# Patient Record
Sex: Female | Born: 1985 | Race: Black or African American | Hispanic: No | Marital: Married | Smoking: Never smoker
Health system: Southern US, Community
[De-identification: ages and names within clinical notes are randomized; demographics above are authoritative.]

## PROBLEM LIST (undated history)

## (undated) ENCOUNTER — Inpatient Hospital Stay (HOSPITAL_COMMUNITY): Payer: Self-pay

## (undated) ENCOUNTER — Emergency Department (HOSPITAL_COMMUNITY): Admission: EM | Payer: Medicaid Other | Source: Home / Self Care

## (undated) DIAGNOSIS — O139 Gestational [pregnancy-induced] hypertension without significant proteinuria, unspecified trimester: Secondary | ICD-10-CM

## (undated) DIAGNOSIS — D219 Benign neoplasm of connective and other soft tissue, unspecified: Secondary | ICD-10-CM

## (undated) DIAGNOSIS — N83201 Unspecified ovarian cyst, right side: Secondary | ICD-10-CM

## (undated) DIAGNOSIS — Q644 Malformation of urachus: Secondary | ICD-10-CM

## (undated) DIAGNOSIS — I499 Cardiac arrhythmia, unspecified: Secondary | ICD-10-CM

## (undated) DIAGNOSIS — I10 Essential (primary) hypertension: Secondary | ICD-10-CM

## (undated) DIAGNOSIS — Z8759 Personal history of other complications of pregnancy, childbirth and the puerperium: Secondary | ICD-10-CM

## (undated) HISTORY — DX: Essential (primary) hypertension: I10

## (undated) HISTORY — DX: Unspecified ovarian cyst, right side: N83.201

## (undated) HISTORY — DX: Cardiac arrhythmia, unspecified: I49.9

## (undated) HISTORY — DX: Benign neoplasm of connective and other soft tissue, unspecified: D21.9

---
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
## 2013-04-10 ENCOUNTER — Encounter: Payer: Self-pay | Admitting: Obstetrics and Gynecology

## 2013-04-10 ENCOUNTER — Ambulatory Visit (INDEPENDENT_AMBULATORY_CARE_PROVIDER_SITE_OTHER): Payer: Self-pay | Admitting: General Practice

## 2013-04-10 DIAGNOSIS — Z3201 Encounter for pregnancy test, result positive: Secondary | ICD-10-CM

## 2013-04-10 LAB — POCT PREGNANCY, URINE: Preg Test, Ur: POSITIVE — AB

## 2013-04-21 ENCOUNTER — Inpatient Hospital Stay (HOSPITAL_COMMUNITY): Payer: Self-pay

## 2013-04-21 ENCOUNTER — Inpatient Hospital Stay (HOSPITAL_COMMUNITY)
Admission: AD | Admit: 2013-04-21 | Discharge: 2013-04-21 | Disposition: A | Discharge status: Home / Self Care | Payer: Self-pay | Source: Ambulatory Visit | Attending: Obstetrics and Gynecology | Admitting: Obstetrics and Gynecology

## 2013-04-21 ENCOUNTER — Encounter (HOSPITAL_COMMUNITY): Payer: Self-pay

## 2013-04-21 DIAGNOSIS — O210 Mild hyperemesis gravidarum: Secondary | ICD-10-CM | POA: Insufficient documentation

## 2013-04-21 DIAGNOSIS — O219 Vomiting of pregnancy, unspecified: Secondary | ICD-10-CM

## 2013-04-21 DIAGNOSIS — O99891 Other specified diseases and conditions complicating pregnancy: Secondary | ICD-10-CM | POA: Insufficient documentation

## 2013-04-21 DIAGNOSIS — O26899 Other specified pregnancy related conditions, unspecified trimester: Secondary | ICD-10-CM

## 2013-04-21 DIAGNOSIS — R109 Unspecified abdominal pain: Secondary | ICD-10-CM | POA: Insufficient documentation

## 2013-04-21 LAB — COMPREHENSIVE METABOLIC PANEL
ALT: 16 U/L (ref 0–35)
Alkaline Phosphatase: 50 U/L (ref 39–117)
CO2: 24 mEq/L (ref 19–32)
Calcium: 10 mg/dL (ref 8.4–10.5)
GFR calc Af Amer: >90 mL/min (ref >90)
GFR calc non Af Amer: >90 mL/min (ref >90)
Glucose, Bld: 91 mg/dL (ref 70–99)
Sodium: 133 mEq/L — ABNORMAL LOW (ref 135–145)

## 2013-04-21 LAB — URINE MICROSCOPIC-ADD ON

## 2013-04-21 LAB — URINALYSIS, ROUTINE W REFLEX MICROSCOPIC
Hgb urine dipstick: NEGATIVE
Protein, ur: NEGATIVE mg/dL
Urobilinogen, UA: 0.2 mg/dL (ref 0.0–1.0)

## 2013-04-21 MED ORDER — PROMETHAZINE HCL 12.5 MG PO TABS: 12.5000 mg | ORAL_TABLET | Freq: Four times a day (QID) | ORAL | Status: DC | PRN

## 2013-04-21 MED ORDER — PRENATAL PLUS 27-1 MG PO TABS: 1.0000 | ORAL_TABLET | Freq: Every day | ORAL | Status: DC

## 2013-04-21 NOTE — MAU Provider Note (Signed)
CSN: [id number]     Arrival date & time 04/21/13  1015 History       Chief Complaint  Patient presents with  . Abdominal Pain   (Consider location/radiation/quality/duration/timing/severity/associated sxs/prior Treatment) HPI Anne Murillo is a 27 yo G1P0 at [redacted]w[redacted]d gestation with a complaint of abdominal pain x4 weeks.  This pain is diffuse, does not radiate, is constant and best described as sharp pains, is not related to eating or position, and nothing makes it better or worse.  She has not taken anything for the pain.  She has never felt this pain before.  She has also suffered from nausea and vomiting throughout her pregnancy, but denies the pain related to the vomiting.  She reports not drinking or eating frequently.  Denies back pain or pelvic pain.  She also denies dysuria or irritative urinary symptoms.  Denies vaginal discharge, bleeding, or vaginal irritative symptoms.  Denies fever, chills, dizziness, or headaches. Past Medical History  Diagnosis Date  . Medical history non-contributory    Past Surgical History  Procedure Laterality Date  . No past surgeries     History reviewed. No pertinent family history. History  Substance Use Topics  . Smoking status: Never Smoker   . Smokeless tobacco: Not on file  . Alcohol Use: No   OB History   Grav Para Term Preterm Abortions TAB SAB Ect Mult Living   1              Review of Systems Pertinent reviewed in HPI.  Allergies  Review of patient's allergies indicates not on file.  Home Medications  No current outpatient prescriptions on file. BP 117/81  Pulse 67  Temp(Src) 98.1 F (36.7 C) (Oral)  Resp 16  Ht 5\' 2"  (1.575 m)  Wt 77.282 kg (170 lb 6 oz)  BMI 31.15 kg/m2  SpO2 100%  LMP 02/26/2013 Physical Exam General:  Lying comfortably on exam table.  Quiet, pleasant, eyes downcast. Lungs: CTAB CV: S1 and S2 distinct. No murmurs/rubs/gallops. Abdomen:  Mild protuberant contour.  No obvious masses, rashes.  Diffuse  mild abdominal pain TTP, not localized.  No rebound or guarding.  Negative Murphy's sign.   Neuro: AAOx3 ED Course   Procedures (including critical care time) Bedside U/S revealed IUP and fetal heart motion.  Labs Reviewed  URINALYSIS, ROUTINE W REFLEX MICROSCOPIC - Abnormal; Notable for the following:    Leukocytes, UA TRACE (*)    All other components within normal limits  URINE MICROSCOPIC-ADD ON  COMPREHENSIVE METABOLIC PANEL   Results for orders placed during the hospital encounter of 04/21/13 (from the past 24 hour(s))  URINALYSIS, ROUTINE W REFLEX MICROSCOPIC     Status: Abnormal   Collection Time    04/21/13 10:48 AM      Result Value Range   Color, Urine YELLOW  YELLOW   APPearance CLEAR  CLEAR   Specific Gravity, Urine 1.015  1.005 - 1.030   pH 6.0  5.0 - 8.0   Glucose, UA NEGATIVE  NEGATIVE mg/dL   Hgb urine dipstick NEGATIVE  NEGATIVE   Bilirubin Urine NEGATIVE  NEGATIVE   Ketones, ur NEGATIVE  NEGATIVE mg/dL   Protein, ur NEGATIVE  NEGATIVE mg/dL   Urobilinogen, UA 0.2  0.0 - 1.0 mg/dL   Nitrite NEGATIVE  NEGATIVE   Leukocytes, UA TRACE (*) NEGATIVE  URINE MICROSCOPIC-ADD ON     Status: None   Collection Time    04/21/13 10:48 AM      Result Value Range  Squamous Epithelial / LPF RARE  RARE   WBC, UA 0-2  <3 WBC/hpf   RBC / HPF 0-2  <3 RBC/hpf   Bacteria, UA RARE  RARE  COMPREHENSIVE METABOLIC PANEL     Status: Abnormal   Collection Time    04/21/13  2:15 PM      Result Value Range   Sodium 133 (*) 135 - 145 mEq/L   Potassium 3.9  3.5 - 5.1 mEq/L   Chloride 99  96 - 112 mEq/L   CO2 24  19 - 32 mEq/L   Glucose, Bld 91  70 - 99 mg/dL   BUN 6  6 - 23 mg/dL   Creatinine, Ser 7.82  0.50 - 1.10 mg/dL   Calcium 95.6  8.4 - 21.3 mg/dL   Total Protein 7.4  6.0 - 8.3 g/dL   Albumin 4.1  3.5 - 5.2 g/dL   AST 17  0 - 37 U/L   ALT 16  0 - 35 U/L   Alkaline Phosphatase 50  39 - 117 U/L   Total Bilirubin 1.0  0.3 - 1.2 mg/dL   GFR calc non Af Amer >90  >90  mL/min   GFR calc Af Amer >90  >90 mL/min      MDM  Assessment and Plan:   G1P0 [redacted]w[redacted]d, viable IUP  NPC 1. Abdominal pain complicating pregnancy   2. Nausea and vomiting in pregnancy prior to [redacted] weeks gestation   Pain likely d/t strenous vomiting  Saw pt with Garnette Czech PA-S and agree with documentation, evaluation and plan AVS N/V, abd pain  Infor on providers and [facility] eligibility , verification letter given   Medication List         acetaminophen 500 MG tablet  Commonly known as:  TYLENOL  Take 1,000 mg by mouth every 6 (six) hours as needed for pain.     prenatal vitamin w/FE, FA 27-1 MG Tabs tablet  Take 1 tablet by mouth daily.     promethazine 12.5 MG tablet  Commonly known as:  PHENERGAN  Take 1 tablet (12.5 mg total) by mouth every 6 (six) hours as needed for nausea.     promethazine 12.5 MG tablet  Commonly known as:  PHENERGAN  Take 1 tablet (12.5 mg total) by mouth every 6 (six) hours as needed for nausea.       Follow-up Information   Schedule an appointment as soon as possible for a visit with [facility] [place]. (See list of prenatal care providers below)    Contact information:   [address] [place] [address] [phone]      Danae Orleans, CNM 04/21/2013 2:30 PM

## 2013-04-21 NOTE — MAU Note (Signed)
Pt/spouse states here for upper abdominal pain felt since beginning of pregnancy 4 weeks ago, has gotten progressively worse. Pt points to pain across upper abdomen. Denies bleeding, notes white vaginal discharge.

## 2013-04-24 ENCOUNTER — Other Ambulatory Visit: Payer: Self-pay

## 2013-04-24 ENCOUNTER — Encounter: Payer: Self-pay | Admitting: Obstetrics and Gynecology

## 2013-04-24 ENCOUNTER — Ambulatory Visit (INDEPENDENT_AMBULATORY_CARE_PROVIDER_SITE_OTHER): Payer: Self-pay | Admitting: Obstetrics and Gynecology

## 2013-04-24 VITALS — BP 139/76 | Temp 99.1°F | Wt 171.2 lb

## 2013-04-24 DIAGNOSIS — Z3401 Encounter for supervision of normal first pregnancy, first trimester: Secondary | ICD-10-CM | POA: Insufficient documentation

## 2013-04-24 DIAGNOSIS — O26891 Other specified pregnancy related conditions, first trimester: Secondary | ICD-10-CM

## 2013-04-24 DIAGNOSIS — O9989 Other specified diseases and conditions complicating pregnancy, childbirth and the puerperium: Secondary | ICD-10-CM

## 2013-04-24 DIAGNOSIS — I499 Cardiac arrhythmia, unspecified: Secondary | ICD-10-CM | POA: Insufficient documentation

## 2013-04-24 DIAGNOSIS — N898 Other specified noninflammatory disorders of vagina: Secondary | ICD-10-CM

## 2013-04-24 DIAGNOSIS — Z3491 Encounter for supervision of normal pregnancy, unspecified, first trimester: Secondary | ICD-10-CM

## 2013-04-24 MED ORDER — PANTOPRAZOLE SODIUM 40 MG PO TBEC: 40.0000 mg | DELAYED_RELEASE_TABLET | Freq: Every day | ORAL | Status: DC

## 2013-04-24 NOTE — Progress Notes (Signed)
P=91, Here for Initial prenatal visit. Interpreter with patient. C/o abdominal area is painful. States is leaking " water that smells bad and perineal area is itchy. Given new patient information.and discussed bmi/ appropriate weight gain.

## 2013-04-24 NOTE — Progress Notes (Signed)
   Subjective:    Anne Murillo is a G1P0 [redacted]w[redacted]d being seen today for her first obstetrical visit with her husband and an interpreter.  Her obstetrical history is not significant.  She was seen in the MAU on 04/21/13 with c/o abdominal pain with N/V.  IUP and +FHR confirmed with [place].  Patient does intend to breast feed. Pregnancy history fully reviewed.  Patient reports nausea and vomiting improved since taking Tylenol and phenergan.  Abdominal pain has also improved but reports persistent, constant epigastric pain that she cannot describe the quality.  Denies contractions, cramping, LOF, or vaginal bleeding.  Reports a white milky vaginal discharge that itches "a little" x 3 weeks without other irritative symptoms.  No dysuria.  Filed Vitals:   04/24/13 1421  BP: 139/76  Temp: 99.1 F (37.3 C)  Weight: 171 lb 3.2 oz (77.656 kg)    HISTORY: OB History   Grav Para Term Preterm Abortions TAB SAB Ect Mult Living   1              # Outc Date GA Lbr Len/2nd Wgt Sex Del Anes PTL Lv   1 CUR              Past Medical History  Diagnosis Date  . Medical history non-contributory    Past Surgical History  Procedure Laterality Date  . No past surgeries     History reviewed. No pertinent family history.   Exam    Uterus:     Pelvic Exam:    Perineum: No Hemorrhoids   Vulva: normal   Vagina:  wet prep done without spec exam, white milky d/c   pH:    Cervix: not observed   Adnexa: not evaluated   Bony Pelvis: not evaluated  System: Breast:  declined exam   Skin: normal coloration and turgor, no rashes    Neurologic: oriented, normal mood   Extremities: normal strength, tone, and muscle mass, no deformities, no erythema, induration, or nodules   HEENT extra ocular movement intact and sclera clear, anicteric   Mouth/Teeth Not examined   Neck supple and no masses   Cardiovascular: occasional PVCs noted; no rubs/murmurs   Respiratory:  appears well, vitals normal, no respiratory  distress, acyanotic, normal RR, chest clear, no wheezing, crepitations, rhonchi, normal symmetric air entry   Abdomen: soft, nontender, no abnormal masses   Urinary: urethral meatus normal      Assessment:    Pregnancy: G1P0 Patient Active Problem List   Diagnosis Date Noted  . Supervision of normal first pregnancy in first trimester 04/24/2013  . Irregular heart rhythm 04/24/2013        Plan:     Initial labs drawn. EKG for baseline: sinus rhythm with occasional PVCs.  Will repeat if symptomatic. Prenatal vitamins. Rx protonix for pregnancy heartburn. Also advised OTC Tums, whichever patient prefers. Continue phenergan for N/V, and tylenol for abdominal pain. Problem list reviewed and updated. Genetic Screening will be discussed at next visit. Ultrasound performed in MAU on 04/21/13.  Will not need to repeat today.  Follow up in 4 weeks. 30% of 30 min visit spent on counseling and coordination of care.    Garnette Czech 04/24/2013

## 2013-04-24 NOTE — Progress Notes (Signed)
Epigastric pain.Heartburn measures and rx protonix if needed. N/V improving.  Pelvic and Pap next visit.

## 2013-04-24 NOTE — Patient Instructions (Addendum)
Pregnancy - First Trimester During sexual intercourse, millions of sperm go into the vagina. Only 1 sperm will penetrate and fertilize the female egg while it is in the Fallopian tube. One week later, the fertilized egg implants into the wall of the uterus. An embryo begins to develop into a baby. At 6 to 8 weeks, the eyes and face are formed and the heartbeat can be seen on ultrasound. At the end of 12 weeks (first trimester), all the baby's organs are formed. Now that you are pregnant, you will want to do everything you can to have a healthy baby. Two of the most important things are to get good prenatal care and follow your caregiver's instructions. Prenatal care is all the medical care you receive before the baby's birth. It is given to prevent, find, and treat problems during the pregnancy and childbirth. PRENATAL EXAMS  During prenatal visits, your weight, blood pressure, and urine are checked. This is done to make sure you are healthy and progressing normally during the pregnancy.  A pregnant woman should gain 25 to 35 pounds during the pregnancy. However, if you are overweight or underweight, your caregiver will advise you regarding your weight.  Your caregiver will ask and answer questions for you.  Blood work, cervical cultures, other necessary tests, and a Pap test are done during your prenatal exams. These tests are done to check on your health and the probable health of your baby. Tests are strongly recommended and done for HIV with your permission. This is the virus that causes AIDS. These tests are done because medicines can be given to help prevent your baby from being born with this infection should you have been infected without knowing it. Blood work is also used to find out your blood type, previous infections, and follow your blood levels (hemoglobin).  Low hemoglobin (anemia) is common during pregnancy. Iron and vitamins are given to help prevent this. Later in the pregnancy, blood  tests for diabetes will be done along with any other tests if any problems develop.  You may need other tests to make sure you and the baby are doing well. CHANGES DURING THE FIRST TRIMESTER  Your body goes through many changes during pregnancy. They vary from person to person. Talk to your caregiver about changes you notice and are concerned about. Changes can include:  Your menstrual period stops.  The egg and sperm carry the genes that determine what you look like. Genes from you and your partner are forming a baby. The female genes determine whether the baby is a boy or a girl.  Your body increases in girth and you may feel bloated.  Feeling sick to your stomach (nauseous) and throwing up (vomiting). If the vomiting is uncontrollable, call your caregiver.  Your breasts will begin to enlarge and become tender.  Your nipples may stick out more and become darker.  The need to urinate more. Painful urination may mean you have a bladder infection.  Tiring easily.  Loss of appetite.  Cravings for certain kinds of food.  At first, you may gain or lose a couple of pounds.  You may have changes in your emotions from day to day (excited to be pregnant or concerned something may go wrong with the pregnancy and baby).  You may have more vivid and strange dreams. HOME CARE INSTRUCTIONS   It is very important to avoid all smoking, alcohol and non-prescribed drugs during your pregnancy. These affect the formation and growth of the baby.   Avoid chemicals while pregnant to ensure the delivery of a healthy infant.  Start your prenatal visits by the 12th week of pregnancy. They are usually scheduled monthly at first, then more often in the last 2 months before delivery. Keep your caregiver's appointments. Follow your caregiver's instructions regarding medicine use, blood and lab tests, exercise, and diet.  During pregnancy, you are providing food for you and your baby. Eat regular, well-balanced  meals. Choose foods such as meat, fish, milk and other low fat dairy products, vegetables, fruits, and whole-grain breads and cereals. Your caregiver will tell you of the ideal weight gain.  You can help morning sickness by keeping soda crackers at the bedside. Eat a couple before arising in the morning. You may want to use the crackers without salt on them.  Eating 4 to 5 small meals rather than 3 large meals a day also may help the nausea and vomiting.  Drinking liquids between meals instead of during meals also seems to help nausea and vomiting.  A physical sexual relationship may be continued throughout pregnancy if there are no other problems. Problems may be early (premature) leaking of amniotic fluid from the membranes, vaginal bleeding, or belly (abdominal) pain.  Exercise regularly if there are no restrictions. Check with your caregiver or physical therapist if you are unsure of the safety of some of your exercises. Greater weight gain will occur in the last 2 trimesters of pregnancy. Exercising will help:  Control your weight.  Keep you in shape.  Prepare you for labor and delivery.  Help you lose your pregnancy weight after you deliver your baby.  Wear a good support or jogging bra for breast tenderness during pregnancy. This may help if worn during sleep too.  Ask when prenatal classes are available. Begin classes when they are offered.  Do not use hot tubs, steam rooms, or saunas.  Wear your seat belt when driving. This protects you and your baby if you are in an accident.  Avoid raw meat, uncooked cheese, cat litter boxes, and soil used by cats throughout the pregnancy. These carry germs that can cause birth defects in the baby.  The first trimester is a good time to visit your dentist for your dental health. Getting your teeth cleaned is okay. Use a softer toothbrush and brush gently during pregnancy.  Ask for help if you have financial, counseling, or nutritional needs  during pregnancy. Your caregiver will be able to offer counseling for these needs as well as refer you for other special needs.  Do not take any medicines or herbs unless told by your caregiver.  Inform your caregiver if there is any mental or physical domestic violence.  Make a list of emergency phone numbers of family, friends, hospital, and police and fire departments.  Write down your questions. Take them to your prenatal visit.  Do not douche.  Do not cross your legs.  If you have to stand for long periods of time, rotate you feet or take small steps in a circle.  You may have more vaginal secretions that may require a sanitary pad. Do not use tampons or scented sanitary pads. MEDICINES AND DRUG USE IN PREGNANCY  Take prenatal vitamins as directed. The vitamin should contain 1 milligram of folic acid. Keep all vitamins out of reach of children. Only a couple vitamins or tablets containing iron may be fatal to a baby or young child when ingested.  Avoid use of all medicines, including herbs, over-the-counter medicines, not   prescribed or suggested by your caregiver. Only take over-the-counter or prescription medicines for pain, discomfort, or fever as directed by your caregiver. Do not use aspirin, ibuprofen, or naproxen unless directed by your caregiver.  Let your caregiver also know about herbs you may be using.  Alcohol is related to a number of birth defects. This includes fetal alcohol syndrome. All alcohol, in any form, should be avoided completely. Smoking will cause low birth rate and premature babies.  Street or illegal drugs are very harmful to the baby. They are absolutely forbidden. A baby born to an addicted mother will be addicted at birth. The baby will go through the same withdrawal an adult does.  Let your caregiver know about any medicines that you have to take and for what reason you take them. SEEK MEDICAL CARE IF:  You have any concerns or worries during your  pregnancy. It is better to call with your questions if you feel they cannot wait, rather than worry about them. SEEK IMMEDIATE MEDICAL CARE IF:   An unexplained oral temperature above 102 F (38.9 C) develops, or as your caregiver suggests.  You have leaking of fluid from the vagina (birth canal). If leaking membranes are suspected, take your temperature and inform your caregiver of this when you call.  There is vaginal spotting or bleeding. Notify your caregiver of the amount and how many pads are used.  You develop a bad smelling vaginal discharge with a change in the color.  You continue to feel sick to your stomach (nauseated) and have no relief from remedies suggested. You vomit blood or coffee ground-like materials.  You lose more than 2 pounds of weight in 1 week.  You gain more than 2 pounds of weight in 1 week and you notice swelling of your face, hands, feet, or legs.  You gain 5 pounds or more in 1 week (even if you do not have swelling of your hands, face, legs, or feet).  You get exposed to [place] measles and have never had them.  You are exposed to fifth disease or chickenpox.  You develop belly (abdominal) pain. Round ligament discomfort is a common non-cancerous (benign) cause of abdominal pain in pregnancy. Your caregiver still must evaluate this.  You develop headache, fever, diarrhea, pain with urination, or shortness of breath.  You fall or are in a car accident or have any kind of trauma.  There is mental or physical violence in your home. Document Released: 08/25/2001 Document Revised: 05/25/2012 Document Reviewed: 02/26/2009 [facility] Patient Information 2014 [place], [place]. Heartburn Heartburn is a painful, burning sensation in the chest. It may feel worse in certain positions, such as lying down or bending over. It is caused by stomach acid backing up into the tube that carries food from the mouth down to the stomach (lower esophagus).  CAUSES   Large  meals.  Certain foods and drinks.  Exercise.  Increased acid production.  Being overweight or obese.  Certain medicines. SYMPTOMS   Burning pain in the chest or lower throat.  Bitter taste in the mouth.  Coughing. DIAGNOSIS  If the usual treatments for heartburn do not improve your symptoms, then tests may be done to see if there is another condition present. Possible tests may include:  X-rays.  Endoscopy. This is when a tube with a light and a camera on the end is used to examine the esophagus and the stomach.  A test to measure the amount of acid in the esophagus (pH test).  A test to see if the esophagus is working properly (esophageal manometry).  Blood, breath, or stool tests to check for bacteria that cause ulcers. TREATMENT   Your caregiver may tell you to use certain over-the-counter medicines (antacids, acid reducers) for mild heartburn.  Your caregiver may prescribe medicines to decrease the acid in your stomach or protect your stomach lining.  Your caregiver may recommend certain diet changes.  For severe cases, your caregiver may recommend that the head of your bed be elevated on blocks. (Sleeping with more pillows is not an effective treatment as it only changes the position of your head and does not improve the main problem of stomach acid refluxing into the esophagus.) HOME CARE INSTRUCTIONS   Take all medicines as directed by your caregiver.  Raise the head of your bed by putting blocks under the legs if instructed to by your caregiver.  Do not exercise right after eating.  Avoid eating 2 or 3 hours before bed. Do not lie down right after eating.  Eat small meals throughout the day instead of 3 large meals.  Stop smoking if you smoke.  Maintain a healthy weight.  Identify foods and beverages that make your symptoms worse and avoid them. Foods you may want to avoid include:  Peppers.  Chocolate.  High-fat foods, including fried  foods.  Spicy foods.  Garlic and onions.  Citrus fruits, including oranges, grapefruit, lemons, and limes.  Food containing tomatoes or tomato products.  Mint.  Carbonated drinks, caffeinated drinks, and alcohol.  Vinegar. SEEK IMMEDIATE MEDICAL CARE IF:  You have severe chest pain that goes down your arm or into your jaw or neck.  You feel sweaty, dizzy, or lightheaded.  You are short of breath.  You vomit blood.  You have difficulty or pain with swallowing.  You have bloody or black, tarry stools.  You have episodes of heartburn more than 3 times a week for more than 2 weeks. MAKE SURE YOU:  Understand these instructions.  Will watch your condition.  Will get help right away if you are not doing well or get worse. Document Released: 01/17/2009 Document Revised: 11/23/2011 Document Reviewed: 02/15/2011 [facility] Patient Information 2014 [place], [place].

## 2013-04-24 NOTE — MAU Provider Note (Signed)
Attestation of Attending Supervision of Advanced Practitioner (CNM/NP): Evaluation and management procedures were performed by the Advanced Practitioner under my supervision and collaboration.  I have reviewed the Advanced Practitioner's note and chart, and I agree with the management and plan.  Ashvin Adelson 04/24/2013 11:19 AM   

## 2013-04-25 ENCOUNTER — Encounter: Payer: Self-pay | Admitting: *Deleted

## 2013-04-25 LAB — OBSTETRIC PANEL
Basophils Absolute: 0 10*3/uL (ref 0.0–0.1)
Eosinophils Absolute: 0 10*3/uL (ref 0.0–0.7)
Eosinophils Relative: 0 % (ref 0–5)
Lymphs Abs: 1.9 10*3/uL (ref 0.7–4.0)
MCH: 27.5 pg (ref 26.0–34.0)
MCHC: 33.9 g/dL (ref 30.0–36.0)
MCV: 81 fL (ref 78.0–100.0)
Platelets: 244 10*3/uL (ref 150–400)
RDW: 14.5 % (ref 11.5–15.5)

## 2013-04-25 LAB — HIV ANTIBODY (ROUTINE TESTING W REFLEX): HIV: NONREACTIVE

## 2013-04-25 LAB — WET PREP, GENITAL: Clue Cells Wet Prep HPF POC: NONE SEEN

## 2013-04-26 LAB — HEMOGLOBINOPATHY EVALUATION
Hgb A: 97 % (ref 96.8–97.8)
Hgb S Quant: 0 %

## 2013-05-22 ENCOUNTER — Ambulatory Visit (INDEPENDENT_AMBULATORY_CARE_PROVIDER_SITE_OTHER): Payer: Self-pay | Admitting: Obstetrics and Gynecology

## 2013-05-22 VITALS — BP 145/75 | Wt 168.8 lb

## 2013-05-22 DIAGNOSIS — A5901 Trichomonal vulvovaginitis: Secondary | ICD-10-CM

## 2013-05-22 DIAGNOSIS — O2390 Unspecified genitourinary tract infection in pregnancy, unspecified trimester: Secondary | ICD-10-CM

## 2013-05-22 DIAGNOSIS — N72 Inflammatory disease of cervix uteri: Secondary | ICD-10-CM

## 2013-05-22 DIAGNOSIS — Z3401 Encounter for supervision of normal first pregnancy, first trimester: Secondary | ICD-10-CM

## 2013-05-22 DIAGNOSIS — R030 Elevated blood-pressure reading, without diagnosis of hypertension: Secondary | ICD-10-CM

## 2013-05-22 MED ORDER — METRONIDAZOLE 250 MG PO TABS: 500.0000 mg | ORAL_TABLET | Freq: Two times a day (BID) | ORAL | Status: AC

## 2013-05-22 NOTE — Addendum Note (Signed)
Addended by: Faythe Casa on: 05/22/2013 04:03 PM   Modules accepted: Orders

## 2013-05-22 NOTE — Progress Notes (Signed)
Has been here from [place] 4 months. BP recheck: Left w/o BP recheck >Will call to come in 1 wk for BP only.  Heart rate and rhythm normal today.  Fundus1/2 to U. Pap done. Cx friable and WP, CT,GC sent. Denies irritative vaginal discharge now. Had trich 1 mo ago> Rx Flagyl Heartburn is relieved by Protonix.

## 2013-05-22 NOTE — Progress Notes (Signed)
Pulse-  95 

## 2013-05-23 LAB — POCT URINALYSIS DIP (DEVICE)
Bilirubin Urine: NEGATIVE
Nitrite: NEGATIVE
Protein, ur: NEGATIVE mg/dL
Urobilinogen, UA: 0.2 mg/dL (ref 0.0–1.0)
pH: 6.5 (ref 5.0–8.0)

## 2013-05-23 LAB — WET PREP, GENITAL
Clue Cells Wet Prep HPF POC: NONE SEEN
Trich, Wet Prep: NONE SEEN

## 2013-05-31 ENCOUNTER — Telehealth: Payer: Self-pay

## 2013-05-31 MED ORDER — METRONIDAZOLE 500 MG PO TABS: 2000.0000 mg | ORAL_TABLET | Freq: Once | ORAL | Status: DC

## 2013-05-31 NOTE — Telephone Encounter (Signed)
Called pt with [employer] interpreter # [phone] and informed pt that she test + for trich and that she has an antibiotic at her [facility] off [address].  I explained to pt that she tell her partner(s) so that they could treated as well and to refrain from intercourse until they are treated.  I informed pt that she would need to come in for a repeat BP check.  Pt stated that she would be able to come in 06/01/13 @ 0800 for BP check.

## 2013-05-31 NOTE — Telephone Encounter (Signed)
Message copied by Faythe Casa on Wed May 31, 2013  9:04 AM ------      Message from: POE, DEIRDRE C      Created: Mon May 22, 2013  3:38 PM       Please call to tell her to pick up Flagyl Rx for trich. Also she left without BP recheck so come back in a wk for BP recheck.  Speaks [place] ------

## 2013-06-01 ENCOUNTER — Ambulatory Visit (INDEPENDENT_AMBULATORY_CARE_PROVIDER_SITE_OTHER): Payer: Self-pay | Admitting: *Deleted

## 2013-06-01 VITALS — BP 122/84 | HR 80

## 2013-06-01 DIAGNOSIS — R030 Elevated blood-pressure reading, without diagnosis of hypertension: Secondary | ICD-10-CM

## 2013-06-01 NOTE — Patient Instructions (Signed)
Trichomoniasis Trichomoniasis is an infection, caused by the Trichomonas organism, that affects both women and men. In women, the outer female genitalia and the vagina are affected. In men, the penis is mainly affected, but the prostate and other reproductive organs can also be involved. Trichomoniasis is a sexually transmitted disease (STD) and is most often passed to another person through sexual contact. The majority of people who get trichomoniasis do so from a sexual encounter and are also at risk for other STDs. CAUSES   Sexual intercourse with an infected partner.  It can be present in swimming pools or hot tubs. SYMPTOMS   Abnormal gray-green frothy vaginal discharge in women.  Vaginal itching and irritation in women.  Itching and irritation of the area outside the vagina in women.  Penile discharge with or without pain in males.  Inflammation of the urethra (urethritis), causing painful urination.  Bleeding after sexual intercourse. RELATED COMPLICATIONS  Pelvic inflammatory disease.  Infection of the uterus (endometritis).  Infertility.  Tubal (ectopic) pregnancy.  It can be associated with other STDs, including gonorrhea and chlamydia, hepatitis B, and HIV. COMPLICATIONS DURING PREGNANCY  Early (premature) delivery.  Premature rupture of the membranes (PROM).  Low birth weight. DIAGNOSIS   Visualization of Trichomonas under the microscope from the vagina discharge.  Ph of the vagina greater than 4.5, tested with a test tape.  Trich Rapid Test.  Culture of the organism, but this is not usually needed.  It may be found on a Pap test.  Having a "strawberry cervix,"which means the cervix looks very red like a strawberry. TREATMENT   You may be given medication to fight the infection. Inform your caregiver if you could be or are pregnant. Some medications used to treat the infection should not be taken during pregnancy.  Over-the-counter medications or  creams to decrease itching or irritation may be recommended.  Your sexual partner will need to be treated if infected. HOME CARE INSTRUCTIONS   Take all medication prescribed by your caregiver.  Take over-the-counter medication for itching or irritation as directed by your caregiver.  Do not have sexual intercourse while you have the infection.  Do not douche or wear tampons.  Discuss your infection with your partner, as your partner may have acquired the infection from you. Or, your partner may have been the person who transmitted the infection to you.  Have your sex partner examined and treated if necessary.  Practice safe, informed, and protected sex.  See your caregiver for other STD testing. SEEK MEDICAL CARE IF:   You still have symptoms after you finish the medication.  You have an oral temperature above 102 F (38.9 C).  You develop belly (abdominal) pain.  You have pain when you urinate.  You have bleeding after sexual intercourse.  You develop a rash.  The medication makes you sick or makes you throw up (vomit). Document Released: 02/24/2001 Document Revised: 11/23/2011 Document Reviewed: 03/22/2009 [facility] Patient Information 2014 [place], [place].  La trichomonase  La trichomonase est une infection cause par l'organisme Trichomonas, qui affecte les femmes et [employer]. Chez les femmes, les organes gnitaux fminins externe et le vagin sont touchs. Chez les hommes, le pnis est principalement affecte, mais la prostate et d'autres organes de la reproduction peut galement tre impliqu. La trichomonase est [employer] sexuellement transmissible (MST) et est le plus souvent transmis  une autre personne par contact sexuel. La majorit des [employer] reoivent la trichomonase faire de la relation sexuelle et sont galement  risque pour d'autres maladies sexuellement transmissibles.  CAUSES  Les rapports sexuels avec un partenaire infect.  Il peut tre  prsent dans les piscines ou de spas.  Symptmes  Anormale gris-vert des pertes vaginales [employer] femmes mousseuse.  Des dmangeaisons et une irritation vaginale Careers information officer.  Dmangeaisons et des irritations de la zone  l'extrieur du vagin Careers information officer.  Pnis dcharge avec ou sans douleur chez les [place].  Inflammation de l'urtre (urtrite), provoquant une miction douloureuse.  Saignements aprs les rapports sexuels.  COMPLICATIONS LIES  La maladie inflammatoire pelvienne.  L'infection de l'utrus (endomtrite).  L'infertilit.  Tubal (extra-utrine) la Nadara Eaton.  Elle peut tre associe  d'autres maladies sexuellement transmissibles, y compris la gonorrhe et la chlamydia, l'hpatite B et le VIH.  Complications durant la Psychologist, educational (prmatur) de livraison.  La rupture prmature des membranes (RPM).  Faible [employer].  DIAGNOSTIC  Visualisation de Trichomonas sous le microscope de la dcharge du vagin.  PH du vagin suprieur  4,5, teste avec une bande d'essai.  La trichomonase test rapide.  Culture de l'organisme, mais ce n'est gnralement pas ncessaire.  Il se trouve sur un test de Pap.  Avoir un "col fraise, qui signifie col de l'utrus est trs rouge comme une fraise.  TRAITEMENT  Vous pouvez tre donn des mdicaments pour [employer]. Informez votre fournisseur de soins si vous pourriez tre ou tes enceinte. Certains mdicaments utiliss pour traiter l'infection ne devraient pas tre pris pendant la grossesse.  Over-the-mdicaments en vente libre ou des crmes pour [employer] ou irritations peuvent tre recommands.  Votre partenaire sexuel devra tre trait s'il est infect.  INSTRUCTIONS DE SOINS  DOMICILE  Prenez tous les mdicaments prescrits par votre fournisseur de [place].  Prenez-the-counter mdicaments pour les dmangeaisons ou d'irritation comme indiqu par votre fournisseur de [place].  Ne pas avoir  des relations sexuelles alors que vous Biochemist, clinical.  vitez les douches vaginales ou porter des tampons.  Discutez de votre infection  votre partenaire, votre Astronomer de vous. Ou, votre partenaire peut avoir t la personne qui a transmis l'infection  vous.  Demandez  votre partenaire de sexe examins et traits si ncessaire.  Pratiquer sr, inform, et des rapports sexuels protgs.  Consultez votre fournisseur de [employer].  Obtenir des soins si:  Vous Conservator, museum/gallery des symptmes aprs avoir termin Lexicographer.  Vous avez une temprature orale ci-dessus de 102  F (38,9  C).  Vous dveloppez ventre (abdominale).  Vous avez des [employer].  Vous avez des [employer] rapports sexuels.  Vous dveloppez une ruption cutane.  Le mdicament rend malade ou vous fait vomir (vomissements).  Document de Sortie: 13/02/2001 Document de rvision: 07/17/2012 Document de rvision: 03/22/2009  ExitCare information des patients  2014 [place], [place].

## 2013-06-01 NOTE — Progress Notes (Signed)
Blood Pressure at last visit was elevated 145/75. Pt left without having recheck done so Deirdre requested that she come back in this week for BP check only.

## 2013-06-01 NOTE — Progress Notes (Signed)
Patient also had questions about her papsmear. She misunderstood the instructions about trich. I gave her written information as well as explained with her interpreter that she needs to be treated and her partner as well. Pt voiced understanding.

## 2013-06-20 ENCOUNTER — Ambulatory Visit (INDEPENDENT_AMBULATORY_CARE_PROVIDER_SITE_OTHER): Payer: Self-pay | Admitting: Family Medicine

## 2013-06-20 ENCOUNTER — Encounter: Payer: Self-pay | Admitting: Family Medicine

## 2013-06-20 VITALS — BP 123/80 | Temp 98.8°F | Wt 168.5 lb

## 2013-06-20 DIAGNOSIS — Z3482 Encounter for supervision of other normal pregnancy, second trimester: Secondary | ICD-10-CM

## 2013-06-20 DIAGNOSIS — Z3401 Encounter for supervision of normal first pregnancy, first trimester: Secondary | ICD-10-CM

## 2013-06-20 DIAGNOSIS — Z23 Encounter for immunization: Secondary | ICD-10-CM

## 2013-06-20 DIAGNOSIS — Z3400 Encounter for supervision of normal first pregnancy, unspecified trimester: Secondary | ICD-10-CM

## 2013-06-20 LAB — POCT URINALYSIS DIP (DEVICE)
Leukocytes, UA: NEGATIVE
Nitrite: NEGATIVE
Protein, ur: NEGATIVE mg/dL
Urobilinogen, UA: 0.2 mg/dL (ref 0.0–1.0)

## 2013-06-20 MED ORDER — PROMETHAZINE HCL 12.5 MG PO TABS: 12.5000 mg | ORAL_TABLET | Freq: Four times a day (QID) | ORAL | Status: DC | PRN

## 2013-06-20 MED ORDER — PRENATAL PLUS 27-1 MG PO TABS: 1.0000 | ORAL_TABLET | Freq: Every day | ORAL | Status: DC

## 2013-06-20 NOTE — Addendum Note (Signed)
Addended by: Franchot Mimes on: 06/20/2013 03:31 PM   Modules accepted: Orders

## 2013-06-20 NOTE — Addendum Note (Signed)
Addended by: Jill Side on: 06/20/2013 03:22 PM   Modules accepted: Orders

## 2013-06-20 NOTE — Progress Notes (Signed)
   Subjective:    Jalessa Posa is a 27 y.o. female being seen today for her obstetrical visit. She is at [redacted]w[redacted]d gestation. Patient reports no bleeding, no contractions, no cramping and vaginal irritation. Fetal movement: NA.  Menstrual History: OB History   Grav Para Term Preterm Abortions TAB SAB Ect Mult Living   1                Patient's last menstrual period was 02/26/2013.    The following portions of the patient's history were reviewed and updated as appropriate: allergies, current medications, past family history, past medical history, past social history, past surgical history and problem list.  Review of Systems Pertinent items are noted in HPI.   Objective:     BP 123/80  Temp(Src) 98.8 F (37.1 C)  Wt 76.431 kg (168 lb 8 oz)  BMI 30.81 kg/m2  LMP 02/26/2013 Uterine Size: size equals dates  Pelvic Exam:          Perineum: is normal                Vulva: normal              Vagina:  curdlike discharge                     pH:                         FHT 150s Assessment:   Correen Folts is a 27 y.o. G1P0 at [redacted]w[redacted]d by L=7 presents for ROB  Discussed with Patient:  - Ordered genetics screen Visual merchandiser screen and [place]) - Patient plans on breast/ bottle feeding. - Routine precautions discussed (depression, infection s/s).   Patient provided with all pertinent phone numbers for emergencies. - RTC for any VB, regular, painful cramps/ctxs occurring at a rate of >2/10 min, fever (100.5 or higher), n/v/d, any pain that is unresolving or worsening. - RTC in 4 weeks for next appt. - tx for trich but not yeast. Persistent discharge, will repeat wet mount and reeval ensure clearing. Partner not had treatment yet  Problems: Patient Active Problem List   Diagnosis Date Noted  . Cervicitis 05/22/2013  . Elevated blood pressure 05/22/2013  . Supervision of normal first pregnancy in first trimester 04/24/2013  . Irregular heart rhythm 04/24/2013    To Do: 1. 20 week  anatomy scan ordered.  Patient to schedule. 2. Quad screen today 3. Flu today  [ ]  Vaccines: Flu: today  Tdap:  [ ]  BCM:   Edu: [x ] PTL precautions; [ ]  BF class; [ ]  childbirth class; [ ]   BF counseling

## 2013-06-20 NOTE — Progress Notes (Signed)
P= 99 Pt. States she needs refill for prenatal vitamin and phenergan; c/o of nausea. C/o of difficulty sleeping- requests something to help her sleep. C/o of pain in left thigh/hip joint when standing from a sitting position. C/o of intermittent lower abdominal/pelvic pain.  Interpreter present.

## 2013-06-20 NOTE — Addendum Note (Signed)
Addended by: Franchot Mimes on: 06/20/2013 02:19 PM   Modules accepted: Orders

## 2013-06-20 NOTE — Addendum Note (Signed)
Addended by: Jill Side on: 06/20/2013 02:27 PM   Modules accepted: Orders

## 2013-06-20 NOTE — Patient Instructions (Signed)
Pregnancy - Second Trimester The second trimester of pregnancy (3 to 6 months) is a period of rapid growth for you and your baby. At the end of the sixth month, your baby is about 9 inches long and weighs 1 1/2 pounds. You will begin to feel the baby move between 18 and 20 weeks of the pregnancy. This is called quickening. Weight gain is faster. A clear fluid (colostrum) may leak out of your breasts. You may feel small contractions of the womb (uterus). This is known as false labor or Braxton-Hicks contractions. This is like a practice for labor when the baby is ready to be born. Usually, the problems with morning sickness have usually passed by the end of your first trimester. Some women develop small dark blotches (called cholasma, mask of pregnancy) on their face that usually goes away after the baby is born. Exposure to the sun makes the blotches worse. Acne may also develop in some pregnant women and pregnant women who have acne, may find that it goes away. PRENATAL EXAMS  Blood work may continue to be done during prenatal exams. These tests are done to check on your health and the probable health of your baby. Blood work is used to follow your blood levels (hemoglobin). Anemia (low hemoglobin) is common during pregnancy. Iron and vitamins are given to help prevent this. You will also be checked for diabetes between 24 and 28 weeks of the pregnancy. Some of the previous blood tests may be repeated.  The size of the uterus is measured during each visit. This is to make sure that the baby is continuing to grow properly according to the dates of the pregnancy.  Your blood pressure is checked every prenatal visit. This is to make sure you are not getting toxemia.  Your urine is checked to make sure you do not have an infection, diabetes or protein in the urine.  Your weight is checked often to make sure gains are happening at the suggested rate. This is to ensure that both you and your baby are  growing normally.  Sometimes, an ultrasound is performed to confirm the proper growth and development of the baby. This is a test which bounces harmless sound waves off the baby so your caregiver can more accurately determine due dates. Sometimes, a test is done on the amniotic fluid surrounding the baby. This test is called an amniocentesis. The amniotic fluid is obtained by sticking a needle into the belly (abdomen). This is done to check the chromosomes in instances where there is a concern about possible genetic problems with the baby. It is also sometimes done near the end of pregnancy if an early delivery is required. In this case, it is done to help make sure the baby's lungs are mature enough for the baby to live outside of the womb. CHANGES OCCURING IN THE SECOND TRIMESTER OF PREGNANCY Your body goes through many changes during pregnancy. They vary from person to person. Talk to your caregiver about changes you notice that you are concerned about.  During the second trimester, you will likely have an increase in your appetite. It is normal to have cravings for certain foods. This varies from person to person and pregnancy to pregnancy.  Your lower abdomen will begin to bulge.  You may have to urinate more often because the uterus and baby are pressing on your bladder. It is also common to get more bladder infections during pregnancy. You can help this by drinking lots of fluids   and emptying your bladder before and after intercourse.  You may begin to get stretch marks on your hips, abdomen, and breasts. These are normal changes in the body during pregnancy. There are no exercises or medicines to take that prevent this change.  You may begin to develop swollen and bulging veins (varicose veins) in your legs. Wearing support hose, elevating your feet for 15 minutes, 3 to 4 times a day and limiting salt in your diet helps lessen the problem.  Heartburn may develop as the uterus grows and  pushes up against the stomach. Antacids recommended by your caregiver helps with this problem. Also, eating smaller meals 4 to 5 times a day helps.  Constipation can be treated with a stool softener or adding bulk to your diet. Drinking lots of fluids, and eating vegetables, fruits, and whole grains are helpful.  Exercising is also helpful. If you have been very active up until your pregnancy, most of these activities can be continued during your pregnancy. If you have been less active, it is helpful to start an exercise program such as walking.  Hemorrhoids may develop at the end of the second trimester. Warm sitz baths and hemorrhoid cream recommended by your caregiver helps hemorrhoid problems.  Backaches may develop during this time of your pregnancy. Avoid heavy lifting, wear low heal shoes, and practice good posture to help with backache problems.  Some pregnant women develop tingling and numbness of their hand and fingers because of swelling and tightening of ligaments in the wrist (carpel tunnel syndrome). This goes away after the baby is born.  As your breasts enlarge, you may have to get a bigger bra. Get a comfortable, cotton, support bra. Do not get a nursing bra until the last month of the pregnancy if you will be nursing the baby.  You may get a dark line from your belly button to the pubic area called the linea nigra.  You may develop rosy cheeks because of increase blood flow to the face.  You may develop spider looking lines of the face, neck, arms, and chest. These go away after the baby is born. HOME CARE INSTRUCTIONS   It is extremely important to avoid all smoking, herbs, alcohol, and unprescribed drugs during your pregnancy. These chemicals affect the formation and growth of the baby. Avoid these chemicals throughout the pregnancy to ensure the delivery of a healthy infant.  Most of your home care instructions are the same as suggested for the first trimester of your  pregnancy. Keep your caregiver's appointments. Follow your caregiver's instructions regarding medicine use, exercise, and diet.  During pregnancy, you are providing food for you and your baby. Continue to eat regular, well-balanced meals. Choose foods such as meat, fish, milk and other low fat dairy products, vegetables, fruits, and whole-grain breads and cereals. Your caregiver will tell you of the ideal weight gain.  A physical sexual relationship may be continued up until near the end of pregnancy if there are no other problems. Problems could include early (premature) leaking of amniotic fluid from the membranes, vaginal bleeding, abdominal pain, or other medical or pregnancy problems.  Exercise regularly if there are no restrictions. Check with your caregiver if you are unsure of the safety of some of your exercises. The greatest weight gain will occur in the last 2 trimesters of pregnancy. Exercise will help you:  Control your weight.  Get you in shape for labor and delivery.  Lose weight after you have the baby.  Wear   a good support or jogging bra for breast tenderness during pregnancy. This may help if worn during sleep. Pads or tissues may be used in the bra if you are leaking colostrum.  Do not use hot tubs, steam rooms or saunas throughout the pregnancy.  Wear your seat belt at all times when driving. This protects you and your baby if you are in an accident.  Avoid raw meat, uncooked cheese, cat litter boxes, and soil used by cats. These carry germs that can cause birth defects in the baby.  The second trimester is also a good time to visit your dentist for your dental health if this has not been done yet. Getting your teeth cleaned is okay. Use a soft toothbrush. Brush gently during pregnancy.  It is easier to leak urine during pregnancy. Tightening up and strengthening the pelvic muscles will help with this problem. Practice stopping your urination while you are going to the  bathroom. These are the same muscles you need to strengthen. It is also the muscles you would use as if you were trying to stop from passing gas. You can practice tightening these muscles up 10 times a set and repeating this about 3 times per day. Once you know what muscles to tighten up, do not perform these exercises during urination. It is more likely to contribute to an infection by backing up the urine.  Ask for help if you have financial, counseling, or nutritional needs during pregnancy. Your caregiver will be able to offer counseling for these needs as well as refer you for other special needs.  Your skin may become oily. If so, wash your face with mild soap, use non-greasy moisturizer and oil or cream based makeup. MEDICINES AND DRUG USE IN PREGNANCY  Take prenatal vitamins as directed. The vitamin should contain 1 milligram of folic acid. Keep all vitamins out of reach of children. Only a couple vitamins or tablets containing iron may be fatal to a baby or young child when ingested.  Avoid use of all medicines, including herbs, over-the-counter medicines, not prescribed or suggested by your caregiver. Only take over-the-counter or prescription medicines for pain, discomfort, or fever as directed by your caregiver. Do not use aspirin.  Let your caregiver also know about herbs you may be using.  Alcohol is related to a number of birth defects. This includes fetal alcohol syndrome. All alcohol, in any form, should be avoided completely. Smoking will cause low birth rate and premature babies.  Street or illegal drugs are very harmful to the baby. They are absolutely forbidden. A baby born to an addicted mother will be addicted at birth. The baby will go through the same withdrawal an adult does. SEEK MEDICAL CARE IF:  You have any concerns or worries during your pregnancy. It is better to call with your questions if you feel they cannot wait, rather than worry about them. SEEK IMMEDIATE  MEDICAL CARE IF:   An unexplained oral temperature above 102 F (38.9 C) develops, or as your caregiver suggests.  You have leaking of fluid from the vagina (birth canal). If leaking membranes are suspected, take your temperature and tell your caregiver of this when you call.  There is vaginal spotting, bleeding, or passing clots. Tell your caregiver of the amount and how many pads are used. Light spotting in pregnancy is common, especially following intercourse.  You develop a bad smelling vaginal discharge with a change in the color from clear to white.  You continue to feel   sick to your stomach (nauseated) and have no relief from remedies suggested. You vomit blood or coffee ground-like materials.  You lose more than 2 pounds of weight or gain more than 2 pounds of weight over 1 week, or as suggested by your caregiver.  You notice swelling of your face, hands, feet, or legs.  You get exposed to [place] measles and have never had them.  You are exposed to fifth disease or chickenpox.  You develop belly (abdominal) pain. Round ligament discomfort is a common non-cancerous (benign) cause of abdominal pain in pregnancy. Your caregiver still must evaluate you.  You develop a bad headache that does not go away.  You develop fever, diarrhea, pain with urination, or shortness of breath.  You develop visual problems, blurry, or double vision.  You fall or are in a car accident or any kind of trauma.  There is mental or physical violence at home. Document Released: 08/25/2001 Document Revised: 05/25/2012 Document Reviewed: 02/27/2009 [facility] Patient Information 2014 [place], [place]. Place 10-18 weeks prenatal visit patient instructions here.

## 2013-06-21 LAB — AFP, QUAD SCREEN
Curr Gest Age: 16.2 wks.days
Down Syndrome Scr Risk Est: 1:11400 {titer}
INH: 130.7 pg/mL
MoM for INH: 0.74
Osb Risk: 1:24300 {titer}
Trisomy 18 (Edward) Syndrome Interp.: 1:56200 {titer}

## 2013-06-21 LAB — WET PREP, GENITAL

## 2013-06-25 ENCOUNTER — Encounter: Payer: Self-pay | Admitting: Family Medicine

## 2013-07-12 ENCOUNTER — Other Ambulatory Visit: Payer: Self-pay | Admitting: Family Medicine

## 2013-07-12 ENCOUNTER — Ambulatory Visit (HOSPITAL_COMMUNITY)
Admission: RE | Admit: 2013-07-12 | Discharge: 2013-07-12 | Disposition: A | Discharge status: Home / Self Care | Payer: Self-pay | Source: Ambulatory Visit | Attending: Family Medicine | Admitting: Family Medicine

## 2013-07-12 ENCOUNTER — Encounter: Payer: Self-pay | Admitting: Family Medicine

## 2013-07-12 DIAGNOSIS — Z3689 Encounter for other specified antenatal screening: Secondary | ICD-10-CM | POA: Insufficient documentation

## 2013-07-12 DIAGNOSIS — Z3401 Encounter for supervision of normal first pregnancy, first trimester: Secondary | ICD-10-CM

## 2013-07-12 DIAGNOSIS — O3410 Maternal care for benign tumor of corpus uteri, unspecified trimester: Secondary | ICD-10-CM | POA: Insufficient documentation

## 2013-07-18 ENCOUNTER — Ambulatory Visit (INDEPENDENT_AMBULATORY_CARE_PROVIDER_SITE_OTHER): Payer: Self-pay | Admitting: Advanced Practice Midwife

## 2013-07-18 VITALS — BP 128/79 | Wt 171.2 lb

## 2013-07-18 DIAGNOSIS — Z3400 Encounter for supervision of normal first pregnancy, unspecified trimester: Secondary | ICD-10-CM

## 2013-07-18 DIAGNOSIS — Z3401 Encounter for supervision of normal first pregnancy, first trimester: Secondary | ICD-10-CM

## 2013-07-18 DIAGNOSIS — G4700 Insomnia, unspecified: Secondary | ICD-10-CM

## 2013-07-18 DIAGNOSIS — N898 Other specified noninflammatory disorders of vagina: Secondary | ICD-10-CM

## 2013-07-18 LAB — POCT URINALYSIS DIP (DEVICE)
Bilirubin Urine: NEGATIVE
Ketones, ur: NEGATIVE mg/dL
Nitrite: NEGATIVE
Protein, ur: NEGATIVE mg/dL

## 2013-07-18 MED ORDER — DIPHENHYDRAMINE HCL 25 MG PO CAPS: 25.0000 mg | ORAL_CAPSULE | ORAL | Status: DC | PRN

## 2013-07-18 MED ORDER — PROMETHAZINE HCL 12.5 MG PO TABS: 12.5000 mg | ORAL_TABLET | Freq: Four times a day (QID) | ORAL | Status: DC | PRN

## 2013-07-18 NOTE — Progress Notes (Signed)
Having trouble sleeping, since pregnancy started. Has tried nothing yet. Will start with Benadryl. Also c/o vaginal discharge with odor.  Wet prep sent. Will treat pending results.

## 2013-07-18 NOTE — Patient Instructions (Signed)

## 2013-07-18 NOTE — Progress Notes (Signed)
P= 100 Pt. Requests refill of phenergan.  Pt. C/o of lower pelvic pain. States she has some discharge that is some times white and other times yellow, does not burn or itch, but does smell bad.

## 2013-07-19 LAB — WET PREP, GENITAL

## 2013-07-19 NOTE — Addendum Note (Signed)
Addended by: Franchot Mimes on: 07/19/2013 02:23 PM   Modules accepted: Orders

## 2013-08-15 ENCOUNTER — Ambulatory Visit (INDEPENDENT_AMBULATORY_CARE_PROVIDER_SITE_OTHER): Payer: Self-pay | Admitting: Advanced Practice Midwife

## 2013-08-15 ENCOUNTER — Encounter: Payer: Self-pay | Admitting: Advanced Practice Midwife

## 2013-08-15 VITALS — BP 129/82 | Temp 98.6°F | Wt 176.8 lb

## 2013-08-15 DIAGNOSIS — I2510 Atherosclerotic heart disease of native coronary artery without angina pectoris: Secondary | ICD-10-CM

## 2013-08-15 DIAGNOSIS — Z3401 Encounter for supervision of normal first pregnancy, first trimester: Secondary | ICD-10-CM

## 2013-08-15 LAB — POCT URINALYSIS DIP (DEVICE)
Protein, ur: NEGATIVE mg/dL
Specific Gravity, Urine: 1.02 (ref 1.005–1.030)
Urobilinogen, UA: 0.2 mg/dL (ref 0.0–1.0)
pH: 6.5 (ref 5.0–8.0)

## 2013-08-15 NOTE — Progress Notes (Signed)
P - 104 

## 2013-08-15 NOTE — Progress Notes (Signed)
Doing well. No complaints. Interpretor present

## 2013-08-15 NOTE — Patient Instructions (Signed)
Second Trimester of Pregnancy The second trimester is from week 13 through week 28, months 4 through 6. The second trimester is often a time when you feel your best. Your body has also adjusted to being pregnant, and you begin to feel better physically. Usually, morning sickness has lessened or quit completely, you may have more energy, and you may have an increase in appetite. The second trimester is also a time when the fetus is growing rapidly. At the end of the sixth month, the fetus is about 9 inches long and weighs about 1 pounds. You will likely begin to feel the baby move (quickening) between 18 and 20 weeks of the pregnancy. BODY CHANGES Your body goes through many changes during pregnancy. The changes vary from woman to woman.   Your weight will continue to increase. You will notice your lower abdomen bulging out.  You may begin to get stretch marks on your hips, abdomen, and breasts.  You may develop headaches that can be relieved by medicines approved by your caregiver.  You may urinate more often because the fetus is pressing on your bladder.  You may develop or continue to have heartburn as a result of your pregnancy.  You may develop constipation because certain hormones are causing the muscles that push waste through your intestines to slow down.  You may develop hemorrhoids or swollen, bulging veins (varicose veins).  You may have back pain because of the weight gain and pregnancy hormones relaxing your joints between the bones in your pelvis and as a result of a shift in weight and the muscles that support your balance.  Your breasts will continue to grow and be tender.  Your gums may bleed and may be sensitive to brushing and flossing.  Dark spots or blotches (chloasma, mask of pregnancy) may develop on your face. This will likely fade after the baby is born.  A dark line from your belly button to the pubic area (linea nigra) may appear. This will likely fade after the  baby is born. WHAT TO EXPECT AT YOUR PRENATAL VISITS During a routine prenatal visit:  You will be weighed to make sure you and the fetus are growing normally.  Your blood pressure will be taken.  Your abdomen will be measured to track your baby's growth.  The fetal heartbeat will be listened to.  Any test results from the previous visit will be discussed. Your caregiver may ask you:  How you are feeling.  If you are feeling the baby move.  If you have had any abnormal symptoms, such as leaking fluid, bleeding, severe headaches, or abdominal cramping.  If you have any questions. Other tests that may be performed during your second trimester include:  Blood tests that check for:  Low iron levels (anemia).  Gestational diabetes (between 24 and 28 weeks).  Rh antibodies.  Urine tests to check for infections, diabetes, or protein in the urine.  An ultrasound to confirm the proper growth and development of the baby.  An amniocentesis to check for possible genetic problems.  Fetal screens for spina bifida and Down syndrome. HOME CARE INSTRUCTIONS   Avoid all smoking, herbs, alcohol, and unprescribed drugs. These chemicals affect the formation and growth of the baby.  Follow your caregiver's instructions regarding medicine use. There are medicines that are either safe or unsafe to take during pregnancy.  Exercise only as directed by your caregiver. Experiencing uterine cramps is a good sign to stop exercising.  Continue to eat regular,   healthy meals.  Wear a good support bra for breast tenderness.  Do not use hot tubs, steam rooms, or saunas.  Wear your seat belt at all times when driving.  Avoid raw meat, uncooked cheese, cat litter boxes, and soil used by cats. These carry germs that can cause birth defects in the baby.  Take your prenatal vitamins.  Try taking a stool softener (if your caregiver approves) if you develop constipation. Eat more high-fiber foods,  such as fresh vegetables or fruit and whole grains. Drink plenty of fluids to keep your urine clear or pale yellow.  Take warm sitz baths to soothe any pain or discomfort caused by hemorrhoids. Use hemorrhoid cream if your caregiver approves.  If you develop varicose veins, wear support hose. Elevate your feet for 15 minutes, 3 4 times a day. Limit salt in your diet.  Avoid heavy lifting, wear low heel shoes, and practice good posture.  Rest with your legs elevated if you have leg cramps or low back pain.  Visit your dentist if you have not gone yet during your pregnancy. Use a soft toothbrush to brush your teeth and be gentle when you floss.  A sexual relationship may be continued unless your caregiver directs you otherwise.  Continue to go to all your prenatal visits as directed by your caregiver. SEEK MEDICAL CARE IF:   You have dizziness.  You have mild pelvic cramps, pelvic pressure, or nagging pain in the abdominal area.  You have persistent nausea, vomiting, or diarrhea.  You have a bad smelling vaginal discharge.  You have pain with urination. SEEK IMMEDIATE MEDICAL CARE IF:   You have a fever.  You are leaking fluid from your vagina.  You have spotting or bleeding from your vagina.  You have severe abdominal cramping or pain.  You have rapid weight gain or loss.  You have shortness of breath with chest pain.  You notice sudden or extreme swelling of your face, hands, ankles, feet, or legs.  You have not felt your baby move in over an hour.  You have severe headaches that do not go away with medicine.  You have vision changes. Document Released: 08/25/2001 Document Revised: 05/03/2013 Document Reviewed: 11/01/2012 [employer] Patient Information 2014 [employer].  

## 2013-08-28 ENCOUNTER — Encounter: Payer: Self-pay | Admitting: *Deleted

## 2013-08-28 DIAGNOSIS — I2510 Atherosclerotic heart disease of native coronary artery without angina pectoris: Secondary | ICD-10-CM | POA: Insufficient documentation

## 2013-08-28 DIAGNOSIS — O99419 Diseases of the circulatory system complicating pregnancy, unspecified trimester: Secondary | ICD-10-CM

## 2013-09-12 ENCOUNTER — Ambulatory Visit (INDEPENDENT_AMBULATORY_CARE_PROVIDER_SITE_OTHER): Payer: Self-pay | Admitting: Obstetrics and Gynecology

## 2013-09-12 VITALS — BP 127/81 | Temp 98.0°F | Wt 178.5 lb

## 2013-09-12 DIAGNOSIS — Z3401 Encounter for supervision of normal first pregnancy, first trimester: Secondary | ICD-10-CM

## 2013-09-12 DIAGNOSIS — Z3400 Encounter for supervision of normal first pregnancy, unspecified trimester: Secondary | ICD-10-CM

## 2013-09-12 DIAGNOSIS — Z23 Encounter for immunization: Secondary | ICD-10-CM

## 2013-09-12 LAB — POCT URINALYSIS DIP (DEVICE)
Glucose, UA: 100 mg/dL — AB
Nitrite: NEGATIVE
Protein, ur: NEGATIVE mg/dL
Urobilinogen, UA: 0.2 mg/dL (ref 0.0–1.0)
pH: 6 (ref 5.0–8.0)

## 2013-09-12 LAB — CBC
HCT: 36 % (ref 36.0–46.0)
MCH: 28.4 pg (ref 26.0–34.0)
MCHC: 33.3 g/dL (ref 30.0–36.0)
MCV: 85.3 fL (ref 78.0–100.0)
Platelets: 222 10*3/uL (ref 150–400)
RDW: 15.2 % (ref 11.5–15.5)
WBC: 7.8 10*3/uL (ref 4.0–10.5)

## 2013-09-12 MED ORDER — TETANUS-DIPHTH-ACELL PERTUSSIS 5-2.5-18.5 LF-MCG/0.5 IM SUSP: 0.5000 mL | Freq: Once | INTRAMUSCULAR | Status: DC

## 2013-09-12 NOTE — Progress Notes (Signed)
=  88  Pt reports pressure/pain since yesterday lower abd.

## 2013-09-12 NOTE — Addendum Note (Signed)
Addended by: Franchot Mimes on: 09/12/2013 03:36 PM   Modules accepted: Orders

## 2013-09-12 NOTE — Patient Instructions (Signed)
Third Trimester of Pregnancy  The third trimester is from week 29 through week 42, months 7 through 9. The third trimester is a time when the fetus is growing rapidly. At the end of the ninth month, the fetus is about 20 inches in length and weighs 6 10 pounds.   BODY CHANGES  Your body goes through many changes during pregnancy. The changes vary from woman to woman.    Your weight will continue to increase. You can expect to gain 25 35 pounds (11 16 kg) by the end of the pregnancy.   You may begin to get stretch marks on your hips, abdomen, and breasts.   You may urinate more often because the fetus is moving lower into your pelvis and pressing on your bladder.   You may develop or continue to have heartburn as a result of your pregnancy.   You may develop constipation because certain hormones are causing the muscles that push waste through your intestines to slow down.   You may develop hemorrhoids or swollen, bulging veins (varicose veins).   You may have pelvic pain because of the weight gain and pregnancy hormones relaxing your joints between the bones in your pelvis. Back aches may result from over exertion of the muscles supporting your posture.   Your breasts will continue to grow and be tender. A yellow discharge may leak from your breasts called colostrum.   Your belly button may stick out.   You may feel short of breath because of your expanding uterus.   You may notice the fetus "dropping," or moving lower in your abdomen.   You may have a bloody mucus discharge. This usually occurs a few days to a week before labor begins.   Your cervix becomes thin and soft (effaced) near your due date.  WHAT TO EXPECT AT YOUR PRENATAL EXAMS   You will have prenatal exams every 2 weeks until week 36. Then, you will have weekly prenatal exams. During a routine prenatal visit:   You will be weighed to make sure you and the fetus are growing normally.   Your blood pressure is taken.   Your abdomen will be  measured to track your baby's growth.   The fetal heartbeat will be listened to.   Any test results from the previous visit will be discussed.   You may have a cervical check near your due date to see if you have effaced.  At around 36 weeks, your caregiver will check your cervix. At the same time, your caregiver will also perform a test on the secretions of the vaginal tissue. This test is to determine if a type of bacteria, Group B streptococcus, is present. Your caregiver will explain this further.  Your caregiver may ask you:   What your birth plan is.   How you are feeling.   If you are feeling the baby move.   If you have had any abnormal symptoms, such as leaking fluid, bleeding, severe headaches, or abdominal cramping.   If you have any questions.  Other tests or screenings that may be performed during your third trimester include:   Blood tests that check for low iron levels (anemia).   Fetal testing to check the health, activity level, and growth of the fetus. Testing is done if you have certain medical conditions or if there are problems during the pregnancy.  FALSE LABOR  You may feel small, irregular contractions that eventually go away. These are called Braxton Hicks contractions, or   false labor. Contractions may last for hours, days, or even weeks before true labor sets in. If contractions come at regular intervals, intensify, or become painful, it is best to be seen by your caregiver.   SIGNS OF LABOR    Menstrual-like cramps.   Contractions that are 5 minutes apart or less.   Contractions that start on the top of the uterus and spread down to the lower abdomen and back.   A sense of increased pelvic pressure or back pain.   A watery or bloody mucus discharge that comes from the vagina.  If you have any of these signs before the 37th week of pregnancy, call your caregiver right away. You need to go to the hospital to get checked immediately.  HOME CARE INSTRUCTIONS    Avoid all  smoking, herbs, alcohol, and unprescribed drugs. These chemicals affect the formation and growth of the baby.   Follow your caregiver's instructions regarding medicine use. There are medicines that are either safe or unsafe to take during pregnancy.   Exercise only as directed by your caregiver. Experiencing uterine cramps is a good sign to stop exercising.   Continue to eat regular, healthy meals.   Wear a good support bra for breast tenderness.   Do not use hot tubs, steam rooms, or saunas.   Wear your seat belt at all times when driving.   Avoid raw meat, uncooked cheese, cat litter boxes, and soil used by cats. These carry germs that can cause birth defects in the baby.   Take your prenatal vitamins.   Try taking a stool softener (if your caregiver approves) if you develop constipation. Eat more high-fiber foods, such as fresh vegetables or fruit and whole grains. Drink plenty of fluids to keep your urine clear or pale yellow.   Take warm sitz baths to soothe any pain or discomfort caused by hemorrhoids. Use hemorrhoid cream if your caregiver approves.   If you develop varicose veins, wear support hose. Elevate your feet for 15 minutes, 3 4 times a day. Limit salt in your diet.   Avoid heavy lifting, wear low heal shoes, and practice good posture.   Rest a lot with your legs elevated if you have leg cramps or low back pain.   Visit your dentist if you have not gone during your pregnancy. Use a soft toothbrush to brush your teeth and be gentle when you floss.   A sexual relationship may be continued unless your caregiver directs you otherwise.   Do not travel far distances unless it is absolutely necessary and only with the approval of your caregiver.   Take prenatal classes to understand, practice, and ask questions about the labor and delivery.   Make a trial run to the hospital.   Pack your hospital bag.   Prepare the baby's nursery.   Continue to go to all your prenatal visits as directed  by your caregiver.  SEEK MEDICAL CARE IF:   You are unsure if you are in labor or if your water has broken.   You have dizziness.   You have mild pelvic cramps, pelvic pressure, or nagging pain in your abdominal area.   You have persistent nausea, vomiting, or diarrhea.   You have a bad smelling vaginal discharge.   You have pain with urination.  SEEK IMMEDIATE MEDICAL CARE IF:    You have a fever.   You are leaking fluid from your vagina.   You have spotting or bleeding from your vagina.     You have severe abdominal cramping or pain.   You have rapid weight loss or gain.   You have shortness of breath with chest pain.   You notice sudden or extreme swelling of your face, hands, ankles, feet, or legs.   You have not felt your baby move in over an hour.   You have severe headaches that do not go away with medicine.   You have vision changes.  Document Released: 08/25/2001 Document Revised: 05/03/2013 Document Reviewed: 11/01/2012  [employer] Patient Information 2014 [employer].

## 2013-09-12 NOTE — Progress Notes (Signed)
[place] interpreter here. 28 wk labs done. TDap given. Gives 1 day hx intermittent lower abd cramps> cx L/C/H. RLP discussed.

## 2013-09-12 NOTE — Addendum Note (Signed)
Addended by: Louanna Raw on: 09/12/2013 03:17 PM   Modules accepted: Orders

## 2013-09-27 ENCOUNTER — Encounter: Payer: Self-pay | Admitting: Advanced Practice Midwife

## 2013-09-27 ENCOUNTER — Ambulatory Visit (INDEPENDENT_AMBULATORY_CARE_PROVIDER_SITE_OTHER): Payer: Self-pay | Admitting: Advanced Practice Midwife

## 2013-09-27 VITALS — BP 132/82 | Temp 98.5°F | Wt 178.1 lb

## 2013-09-27 DIAGNOSIS — Z3400 Encounter for supervision of normal first pregnancy, unspecified trimester: Secondary | ICD-10-CM

## 2013-09-27 DIAGNOSIS — Z3401 Encounter for supervision of normal first pregnancy, first trimester: Secondary | ICD-10-CM

## 2013-09-27 LAB — POCT URINALYSIS DIP (DEVICE)
Bilirubin Urine: NEGATIVE
GLUCOSE, UA: NEGATIVE mg/dL
HGB URINE DIPSTICK: NEGATIVE
KETONES UR: NEGATIVE mg/dL
Nitrite: NEGATIVE
Protein, ur: NEGATIVE mg/dL
SPECIFIC GRAVITY, URINE: 1.025 (ref 1.005–1.030)
Urobilinogen, UA: 0.2 mg/dL (ref 0.0–1.0)
pH: 5.5 (ref 5.0–8.0)

## 2013-09-27 NOTE — Progress Notes (Signed)
Seen also by me.  Agree with note.

## 2013-09-27 NOTE — Patient Instructions (Signed)
Third Trimester of Pregnancy The third trimester is from week 29 through week 42, months 7 through 9. The third trimester is a time when the fetus is growing rapidly. At the end of the ninth month, the fetus is about 20 inches in length and weighs 6 10 pounds.  BODY CHANGES Your body goes through many changes during pregnancy. The changes vary from woman to woman.   Your weight will continue to increase. You can expect to gain 25 35 pounds (11 16 kg) by the end of the pregnancy.  You may begin to get stretch marks on your hips, abdomen, and breasts.  You may urinate more often because the fetus is moving lower into your pelvis and pressing on your bladder.  You may develop or continue to have heartburn as a result of your pregnancy.  You may develop constipation because certain hormones are causing the muscles that push waste through your intestines to slow down.  You may develop hemorrhoids or swollen, bulging veins (varicose veins).  You may have pelvic pain because of the weight gain and pregnancy hormones relaxing your joints between the bones in your pelvis. Back aches may result from over exertion of the muscles supporting your posture.  Your breasts will continue to grow and be tender. A yellow discharge may leak from your breasts called colostrum.  Your belly button may stick out.  You may feel short of breath because of your expanding uterus.  You may notice the fetus "dropping," or moving lower in your abdomen.  You may have a bloody mucus discharge. This usually occurs a few days to a week before labor begins.  Your cervix becomes thin and soft (effaced) near your due date. WHAT TO EXPECT AT YOUR PRENATAL EXAMS  You will have prenatal exams every 2 weeks until week 36. Then, you will have weekly prenatal exams. During a routine prenatal visit:  You will be weighed to make sure you and the fetus are growing normally.  Your blood pressure is taken.  Your abdomen will be  measured to track your baby's growth.  The fetal heartbeat will be listened to.  Any test results from the previous visit will be discussed.  You may have a cervical check near your due date to see if you have effaced. At around 36 weeks, your caregiver will check your cervix. At the same time, your caregiver will also perform a test on the secretions of the vaginal tissue. This test is to determine if a type of bacteria, Group B streptococcus, is present. Your caregiver will explain this further. Your caregiver may ask you:  What your birth plan is.  How you are feeling.  If you are feeling the baby move.  If you have had any abnormal symptoms, such as leaking fluid, bleeding, severe headaches, or abdominal cramping.  If you have any questions. Other tests or screenings that may be performed during your third trimester include:  Blood tests that check for low iron levels (anemia).  Fetal testing to check the health, activity level, and growth of the fetus. Testing is done if you have certain medical conditions or if there are problems during the pregnancy. FALSE LABOR You may feel small, irregular contractions that eventually go away. These are called Braxton Hicks contractions, or false labor. Contractions may last for hours, days, or even weeks before true labor sets in. If contractions come at regular intervals, intensify, or become painful, it is best to be seen by your caregiver.  SIGNS OF LABOR   Menstrual-like cramps.  Contractions that are 5 minutes apart or less.  Contractions that start on the top of the uterus and spread down to the lower abdomen and back.  A sense of increased pelvic pressure or back pain.  A watery or bloody mucus discharge that comes from the vagina. If you have any of these signs before the 37th week of pregnancy, call your caregiver right away. You need to go to the hospital to get checked immediately. HOME CARE INSTRUCTIONS   Avoid all  smoking, herbs, alcohol, and unprescribed drugs. These chemicals affect the formation and growth of the baby.  Follow your caregiver's instructions regarding medicine use. There are medicines that are either safe or unsafe to take during pregnancy.  Exercise only as directed by your caregiver. Experiencing uterine cramps is a good sign to stop exercising.  Continue to eat regular, healthy meals.  Wear a good support bra for breast tenderness.  Do not use hot tubs, steam rooms, or saunas.  Wear your seat belt at all times when driving.  Avoid raw meat, uncooked cheese, cat litter boxes, and soil used by cats. These carry germs that can cause birth defects in the baby.  Take your prenatal vitamins.  Try taking a stool softener (if your caregiver approves) if you develop constipation. Eat more high-fiber foods, such as fresh vegetables or fruit and whole grains. Drink plenty of fluids to keep your urine clear or pale yellow.  Take warm sitz baths to soothe any pain or discomfort caused by hemorrhoids. Use hemorrhoid cream if your caregiver approves.  If you develop varicose veins, wear support hose. Elevate your feet for 15 minutes, 3 4 times a day. Limit salt in your diet.  Avoid heavy lifting, wear low heal shoes, and practice good posture.  Rest a lot with your legs elevated if you have leg cramps or low back pain.  Visit your dentist if you have not gone during your pregnancy. Use a soft toothbrush to brush your teeth and be gentle when you floss.  A sexual relationship may be continued unless your caregiver directs you otherwise.  Do not travel far distances unless it is absolutely necessary and only with the approval of your caregiver.  Take prenatal classes to understand, practice, and ask questions about the labor and delivery.  Make a trial run to the hospital.  Pack your hospital bag.  Prepare the baby's nursery.  Continue to go to all your prenatal visits as directed  by your caregiver. SEEK MEDICAL CARE IF:  You are unsure if you are in labor or if your water has broken.  You have dizziness.  You have mild pelvic cramps, pelvic pressure, or nagging pain in your abdominal area.  You have persistent nausea, vomiting, or diarrhea.  You have a bad smelling vaginal discharge.  You have pain with urination. SEEK IMMEDIATE MEDICAL CARE IF:   You have a fever.  You are leaking fluid from your vagina.  You have spotting or bleeding from your vagina.  You have severe abdominal cramping or pain.  You have rapid weight loss or gain.  You have shortness of breath with chest pain.  You notice sudden or extreme swelling of your face, hands, ankles, feet, or legs.  You have not felt your baby move in over an hour.  You have severe headaches that do not go away with medicine.  You have vision changes. Document Released: 08/25/2001 Document Revised: 05/03/2013 Document Reviewed:   You have severe abdominal cramping or pain.   You have rapid weight loss or gain.   You have shortness of breath with chest pain.   You notice sudden or extreme swelling of your face, hands, ankles, feet, or legs.   You have not felt your baby move in over an hour.   You have severe headaches that do not go away with medicine.   You have vision changes.  Document Released: 08/25/2001 Document Revised: 05/03/2013 Document Reviewed: 11/01/2012  [employer] Patient Information 2014 [employer].

## 2013-09-27 NOTE — Progress Notes (Signed)
[place] interpreter required.  Patient is doing well.  No complaints.  Denies vaginal bleeding, loss of fluids, or contractions.  Good fetal movement.  Plans to breastfeed.  Discussed birth control options - would like to think about it.

## 2013-09-27 NOTE — Progress Notes (Signed)
P = 102   [place] interpreter here

## 2013-10-11 ENCOUNTER — Encounter: Payer: Self-pay | Admitting: Advanced Practice Midwife

## 2013-10-11 ENCOUNTER — Ambulatory Visit (INDEPENDENT_AMBULATORY_CARE_PROVIDER_SITE_OTHER): Payer: Self-pay | Admitting: Advanced Practice Midwife

## 2013-10-11 VITALS — BP 130/86 | Wt 178.3 lb

## 2013-10-11 DIAGNOSIS — Z609 Problem related to social environment, unspecified: Secondary | ICD-10-CM

## 2013-10-11 DIAGNOSIS — I2510 Atherosclerotic heart disease of native coronary artery without angina pectoris: Secondary | ICD-10-CM

## 2013-10-11 DIAGNOSIS — Z603 Acculturation difficulty: Secondary | ICD-10-CM | POA: Insufficient documentation

## 2013-10-11 DIAGNOSIS — O99419 Diseases of the circulatory system complicating pregnancy, unspecified trimester: Secondary | ICD-10-CM

## 2013-10-11 LAB — POCT URINALYSIS DIP (DEVICE)
BILIRUBIN URINE: NEGATIVE
Glucose, UA: 250 mg/dL — AB
HGB URINE DIPSTICK: NEGATIVE
KETONES UR: NEGATIVE mg/dL
Nitrite: NEGATIVE
PH: 6 (ref 5.0–8.0)
Protein, ur: NEGATIVE mg/dL
SPECIFIC GRAVITY, URINE: 1.015 (ref 1.005–1.030)
Urobilinogen, UA: 0.2 mg/dL (ref 0.0–1.0)

## 2013-10-11 NOTE — Progress Notes (Signed)
Doing well. No leaking, bleeding or pain. Good FM

## 2013-10-11 NOTE — Patient Instructions (Signed)
Third Trimester of Pregnancy The third trimester is from week 29 through week 42, months 7 through 9. The third trimester is a time when the fetus is growing rapidly. At the end of the ninth month, the fetus is about 20 inches in length and weighs 6 10 pounds.  BODY CHANGES Your body goes through many changes during pregnancy. The changes vary from woman to woman.   Your weight will continue to increase. You can expect to gain 25 35 pounds (11 16 kg) by the end of the pregnancy.  You may begin to get stretch marks on your hips, abdomen, and breasts.  You may urinate more often because the fetus is moving lower into your pelvis and pressing on your bladder.  You may develop or continue to have heartburn as a result of your pregnancy.  You may develop constipation because certain hormones are causing the muscles that push waste through your intestines to slow down.  You may develop hemorrhoids or swollen, bulging veins (varicose veins).  You may have pelvic pain because of the weight gain and pregnancy hormones relaxing your joints between the bones in your pelvis. Back aches may result from over exertion of the muscles supporting your posture.  Your breasts will continue to grow and be tender. A yellow discharge may leak from your breasts called colostrum.  Your belly button may stick out.  You may feel short of breath because of your expanding uterus.  You may notice the fetus "dropping," or moving lower in your abdomen.  You may have a bloody mucus discharge. This usually occurs a few days to a week before labor begins.  Your cervix becomes thin and soft (effaced) near your due date. WHAT TO EXPECT AT YOUR PRENATAL EXAMS  You will have prenatal exams every 2 weeks until week 36. Then, you will have weekly prenatal exams. During a routine prenatal visit:  You will be weighed to make sure you and the fetus are growing normally.  Your blood pressure is taken.  Your abdomen will be  measured to track your baby's growth.  The fetal heartbeat will be listened to.  Any test results from the previous visit will be discussed.  You may have a cervical check near your due date to see if you have effaced. At around 36 weeks, your caregiver will check your cervix. At the same time, your caregiver will also perform a test on the secretions of the vaginal tissue. This test is to determine if a type of bacteria, Group B streptococcus, is present. Your caregiver will explain this further. Your caregiver may ask you:  What your birth plan is.  How you are feeling.  If you are feeling the baby move.  If you have had any abnormal symptoms, such as leaking fluid, bleeding, severe headaches, or abdominal cramping.  If you have any questions. Other tests or screenings that may be performed during your third trimester include:  Blood tests that check for low iron levels (anemia).  Fetal testing to check the health, activity level, and growth of the fetus. Testing is done if you have certain medical conditions or if there are problems during the pregnancy. FALSE LABOR You may feel small, irregular contractions that eventually go away. These are called Braxton Hicks contractions, or false labor. Contractions may last for hours, days, or even weeks before true labor sets in. If contractions come at regular intervals, intensify, or become painful, it is best to be seen by your caregiver.  SIGNS OF LABOR   Menstrual-like cramps.  Contractions that are 5 minutes apart or less.  Contractions that start on the top of the uterus and spread down to the lower abdomen and back.  A sense of increased pelvic pressure or back pain.  A watery or bloody mucus discharge that comes from the vagina. If you have any of these signs before the 37th week of pregnancy, call your caregiver right away. You need to go to the hospital to get checked immediately. HOME CARE INSTRUCTIONS   Avoid all  smoking, herbs, alcohol, and unprescribed drugs. These chemicals affect the formation and growth of the baby.  Follow your caregiver's instructions regarding medicine use. There are medicines that are either safe or unsafe to take during pregnancy.  Exercise only as directed by your caregiver. Experiencing uterine cramps is a good sign to stop exercising.  Continue to eat regular, healthy meals.  Wear a good support bra for breast tenderness.  Do not use hot tubs, steam rooms, or saunas.  Wear your seat belt at all times when driving.  Avoid raw meat, uncooked cheese, cat litter boxes, and soil used by cats. These carry germs that can cause birth defects in the baby.  Take your prenatal vitamins.  Try taking a stool softener (if your caregiver approves) if you develop constipation. Eat more high-fiber foods, such as fresh vegetables or fruit and whole grains. Drink plenty of fluids to keep your urine clear or pale yellow.  Take warm sitz baths to soothe any pain or discomfort caused by hemorrhoids. Use hemorrhoid cream if your caregiver approves.  If you develop varicose veins, wear support hose. Elevate your feet for 15 minutes, 3 4 times a day. Limit salt in your diet.  Avoid heavy lifting, wear low heal shoes, and practice good posture.  Rest a lot with your legs elevated if you have leg cramps or low back pain.  Visit your dentist if you have not gone during your pregnancy. Use a soft toothbrush to brush your teeth and be gentle when you floss.  A sexual relationship may be continued unless your caregiver directs you otherwise.  Do not travel far distances unless it is absolutely necessary and only with the approval of your caregiver.  Take prenatal classes to understand, practice, and ask questions about the labor and delivery.  Make a trial run to the hospital.  Pack your hospital bag.  Prepare the baby's nursery.  Continue to go to all your prenatal visits as directed  by your caregiver. SEEK MEDICAL CARE IF:  You are unsure if you are in labor or if your water has broken.  You have dizziness.  You have mild pelvic cramps, pelvic pressure, or nagging pain in your abdominal area.  You have persistent nausea, vomiting, or diarrhea.  You have a bad smelling vaginal discharge.  You have pain with urination. SEEK IMMEDIATE MEDICAL CARE IF:   You have a fever.  You are leaking fluid from your vagina.  You have spotting or bleeding from your vagina.  You have severe abdominal cramping or pain.  You have rapid weight loss or gain.  You have shortness of breath with chest pain.  You notice sudden or extreme swelling of your face, hands, ankles, feet, or legs.  You have not felt your baby move in over an hour.  You have severe headaches that do not go away with medicine.  You have vision changes. Document Released: 08/25/2001 Document Revised: 05/03/2013 Document Reviewed:   You have severe abdominal cramping or pain.   You have rapid weight loss or gain.   You have shortness of breath with chest pain.   You notice sudden or extreme swelling of your face, hands, ankles, feet, or legs.   You have not felt your baby move in over an hour.   You have severe headaches that do not go away with medicine.   You have vision changes.  Document Released: 08/25/2001 Document Revised: 05/03/2013 Document Reviewed: 11/01/2012  [employer] Patient Information 2014 [employer].

## 2013-10-11 NOTE — Progress Notes (Signed)
Pulse: 106

## 2013-10-25 ENCOUNTER — Encounter: Payer: Self-pay | Admitting: Family Medicine

## 2013-10-25 ENCOUNTER — Ambulatory Visit (INDEPENDENT_AMBULATORY_CARE_PROVIDER_SITE_OTHER): Payer: Medicaid Other | Admitting: Family Medicine

## 2013-10-25 VITALS — BP 120/76 | Temp 97.5°F | Wt 180.1 lb

## 2013-10-25 DIAGNOSIS — O10019 Pre-existing essential hypertension complicating pregnancy, unspecified trimester: Secondary | ICD-10-CM

## 2013-10-25 DIAGNOSIS — O10919 Unspecified pre-existing hypertension complicating pregnancy, unspecified trimester: Secondary | ICD-10-CM

## 2013-10-25 DIAGNOSIS — O139 Gestational [pregnancy-induced] hypertension without significant proteinuria, unspecified trimester: Secondary | ICD-10-CM

## 2013-10-25 DIAGNOSIS — Z3401 Encounter for supervision of normal first pregnancy, first trimester: Secondary | ICD-10-CM

## 2013-10-25 LAB — CBC
HEMATOCRIT: 37 % (ref 36.0–46.0)
Hemoglobin: 12.4 g/dL (ref 12.0–15.0)
MCH: 28.1 pg (ref 26.0–34.0)
MCHC: 33.5 g/dL (ref 30.0–36.0)
MCV: 83.7 fL (ref 78.0–100.0)
PLATELETS: 244 10*3/uL (ref 150–400)
RBC: 4.42 MIL/uL (ref 3.87–5.11)
RDW: 14.5 % (ref 11.5–15.5)
WBC: 6.8 10*3/uL (ref 4.0–10.5)

## 2013-10-25 LAB — POCT URINALYSIS DIP (DEVICE)
Bilirubin Urine: NEGATIVE
Glucose, UA: 250 mg/dL — AB
KETONES UR: NEGATIVE mg/dL
Nitrite: NEGATIVE
PH: 5.5 (ref 5.0–8.0)
PROTEIN: NEGATIVE mg/dL
SPECIFIC GRAVITY, URINE: 1.02 (ref 1.005–1.030)
Urobilinogen, UA: 1 mg/dL (ref 0.0–1.0)

## 2013-10-25 NOTE — Addendum Note (Signed)
Addended by: Ernie Avena on: 10/25/2013 12:06 PM   Modules accepted: Orders

## 2013-10-25 NOTE — Progress Notes (Signed)
Pulse- 125

## 2013-10-25 NOTE — Progress Notes (Signed)
Pt reports headaches resolves with tylenol without vision changes. No abdominal pain no RUQ pain.  +FM, no lof, no vb, no ctx  Anne Murillo is a 28 y.o. G1P0 at [redacted]w[redacted]d  here for ROB visit.  Discussed with Patient:  -Plans to breast feed.  All questions answered. -Continue prenatal vitamins. -Reviewed fetal kick counts Pt to perform daily at a time when the baby is active, lie laterally with both hands on belly in quiet room and count all movements (hiccups, shoulder rolls, obvious kicks, etc); pt is to report to clinic L&D for less than 10 movements felt in a one hour time period-pt told as soon as she counts 10 movements the count is complete.  - Routine precautions discussed (depression, infection s/s).   Patient provided with all pertinent phone numbers for emergencies. - RTC for any VB, regular, painful cramps/ctxs occurring at a rate of >2/10 min, fever (100.5 or higher), n/v/d, any pain that is unresolving or worsening, LOF, decreased fetal movement, CP, SOB, edema - reviewed preeclampsia precautions.  - RT HRCin 1 weeks for next appt.  Problems: Patient Active Problem List   Diagnosis Date Noted  . Language barrier, cultural differences 10/11/2013  . Other cardiovascular diseases of mother, antepartum 08/28/2013  . Insomnia 07/18/2013  . Cervicitis 05/22/2013  . Elevated blood pressure 05/22/2013  . Supervision of normal first pregnancy in first trimester 04/24/2013  . Irregular heart rhythm 04/24/2013    To Do: 1. CBC. CMP, PR: Cr and 24hr urine, Growth [place] (pt is self pay, will discuss with husband if able to pay. Will schedule and if unable to afford will cancel)  [ ]  BCM: undecided [ ]  Readiness:   Edu: [x]  PTL precautions; [ ]  BF class; [ ]  childbirth class; [ ]   BF counseling;

## 2013-10-25 NOTE — Patient Instructions (Signed)
Hypertension During Pregnancy Hypertension is also called high blood pressure. It can occur at any time in life and during pregnancy. When you have hypertension, there is extra pressure inside your blood vessels that carry blood from the heart to the rest of your body (arteries). Hypertension during pregnancy can cause problems for you and your baby. Your baby might not weigh as much as it should at birth or might be born early (premature). Very bad cases of hypertension during pregnancy can be life threatening.  Different types of hypertension can occur during pregnancy.   Chronic hypertension. This happens when a woman has hypertension before pregnancy and it continues during pregnancy.  Gestational hypertension. This is when hypertension develops during pregnancy.  Preeclampsia or toxemia of pregnancy. This is a very serious type of hypertension that develops only during pregnancy. It is a disease that affects the whole body (systemic) and can be very dangerous for both mother and baby.  Gestational hypertension and preeclampsia usually go away after your baby is born. Blood pressure generally stabilizes within 6 weeks. Women who have hypertension during pregnancy have a greater chance of developing hypertension later in life or with future pregnancies. RISK FACTORS Some factors make you more likely to develop hypertension during pregnancy. Risk factors include:  Having hypertension before pregnancy.  Having hypertension during a previous pregnancy.  Being overweight.  Being older than 40.  Being pregnant with more than one baby (multiples).  Having diabetes or kidney problems. SIGNS AND SYMPTOMS Chronic and gestational hypertension rarely cause symptoms. Preeclampsia has symptoms, which may include:  Increased protein in your urine. Your health care provider will check for this at every prenatal visit.  Swelling of your hands and face.  Rapid weight gain.  Headaches.  Visual  changes.  Being bothered by light.  Abdominal pain, especially in the right upper area.  Chest pain.  Shortness of breath.  Increased reflexes.  Seizures. Seizures occur with a more severe form of preeclampsia, called eclampsia. DIAGNOSIS   You may be diagnosed with hypertension during a regular prenatal exam. At each visit, tests may include:  Blood pressure checks.  A urine test to check for protein in your urine.  The type of hypertension you are diagnosed with depends on when you developed it. It also depends on your specific blood pressure reading.  Developing hypertension before 20 weeks of pregnancy is consistent with chronic hypertension.  Developing hypertension after 20 weeks of pregnancy is consistent with gestational hypertension.  Hypertension with increased urinary protein is diagnosed as preeclampsia.  Blood pressure measurements that stay above 160 systolic or 110 diastolic are a sign of severe preeclampsia. TREATMENT Treatment for hypertension during pregnancy varies. Treatment depends on the type of hypertension and how serious it is.  If you take medicine for chronic hypertension, you may need to switch medicines.  Drugs called ACE inhibitors should not be taken during pregnancy.  Low-dose aspirin may be suggested for women who have risk factors for preeclampsia.  If you have gestational hypertension, you may need to take a blood pressure medicine that is safe during pregnancy. Your health care provider will recommend the appropriate medicine.  If you have severe preeclampsia, you may need to be in the hospital. Health care providers will watch you and the baby very closely. You also may need to take medicine (magnesium sulfate) to prevent seizures and lower blood pressure.  Sometimes an early delivery is needed. This may be the case if the condition worsens. It would   be done to protect you and the baby. The only cure for preeclampsia is delivery. HOME  CARE INSTRUCTIONS  Schedule and keep all of your regular appointments for prenatal care.  Only take over-the-counter or prescription medicines as directed by your health care provider. Tell your health care provider about all medicines you take.  Eat as little salt as possible.  Get regular exercise.  Do not drink alcohol.  Do not use tobacco products.  Do not drink products with caffeine.  Lie on your left side when resting. SEEK IMMEDIATE MEDICAL CARE IF:  You have severe abdominal pain.  You have sudden swelling in the hands, ankles, or face.  You gain 4 pounds (1.8 kg) or more in 1 week.  You vomit repeatedly.  You have vaginal bleeding.  You do not feel the baby moving as much.  You have a headache.  You have blurred or double vision.  You have muscle twitching or spasms.  You have shortness of breath.  You have blue fingernails and lips.  You have blood in your urine. MAKE SURE YOU:  Understand these instructions.  Will watch your condition.  Will get help right away if you are not doing well or get worse. Document Released: 05/19/2011 Document Revised: 06/21/2013 Document Reviewed: 03/30/2013 [facility] Patient Information 2014 [place], [place]. Third Trimester of Pregnancy The third trimester is from week 29 through week 42, months 7 through 9. The third trimester is a time when the fetus is growing rapidly. At the end of the ninth month, the fetus is about 20 inches in length and weighs 6 10 pounds.  BODY CHANGES Your body goes through many changes during pregnancy. The changes vary from woman to woman.   Your weight will continue to increase. You can expect to gain 25 35 pounds (11 16 kg) by the end of the pregnancy.  You may begin to get stretch marks on your hips, abdomen, and breasts.  You may urinate more often because the fetus is moving lower into your pelvis and pressing on your bladder.  You may develop or continue to have heartburn as a  result of your pregnancy.  You may develop constipation because certain hormones are causing the muscles that push waste through your intestines to slow down.  You may develop hemorrhoids or swollen, bulging veins (varicose veins).  You may have pelvic pain because of the weight gain and pregnancy hormones relaxing your joints between the bones in your pelvis. Back aches may result from over exertion of the muscles supporting your posture.  Your breasts will continue to grow and be tender. A yellow discharge may leak from your breasts called colostrum.  Your belly button may stick out.  You may feel short of breath because of your expanding uterus.  You may notice the fetus "dropping," or moving lower in your abdomen.  You may have a bloody mucus discharge. This usually occurs a few days to a week before labor begins.  Your cervix becomes thin and soft (effaced) near your due date. WHAT TO EXPECT AT YOUR PRENATAL EXAMS  You will have prenatal exams every 2 weeks until week 36. Then, you will have weekly prenatal exams. During a routine prenatal visit:  You will be weighed to make sure you and the fetus are growing normally.  Your blood pressure is taken.  Your abdomen will be measured to track your baby's growth.  The fetal heartbeat will be listened to.  Any test results from the previous visit  will be discussed.  You may have a cervical check near your due date to see if you have effaced. At around 36 weeks, your caregiver will check your cervix. At the same time, your caregiver will also perform a test on the secretions of the vaginal tissue. This test is to determine if a type of bacteria, Group B streptococcus, is present. Your caregiver will explain this further. Your caregiver may ask you:  What your birth plan is.  How you are feeling.  If you are feeling the baby move.  If you have had any abnormal symptoms, such as leaking fluid, bleeding, severe headaches, or  abdominal cramping.  If you have any questions. Other tests or screenings that may be performed during your third trimester include:  Blood tests that check for low iron levels (anemia).  Fetal testing to check the health, activity level, and growth of the fetus. Testing is done if you have certain medical conditions or if there are problems during the pregnancy. FALSE LABOR You may feel small, irregular contractions that eventually go away. These are called Braxton Hicks contractions, or false labor. Contractions may last for hours, days, or even weeks before true labor sets in. If contractions come at regular intervals, intensify, or become painful, it is best to be seen by your caregiver.  SIGNS OF LABOR   Menstrual-like cramps.  Contractions that are 5 minutes apart or less.  Contractions that start on the top of the uterus and spread down to the lower abdomen and back.  A sense of increased pelvic pressure or back pain.  A watery or bloody mucus discharge that comes from the vagina. If you have any of these signs before the 37th week of pregnancy, call your caregiver right away. You need to go to the hospital to get checked immediately. HOME CARE INSTRUCTIONS   Avoid all smoking, herbs, alcohol, and unprescribed drugs. These chemicals affect the formation and growth of the baby.  Follow your caregiver's instructions regarding medicine use. There are medicines that are either safe or unsafe to take during pregnancy.  Exercise only as directed by your caregiver. Experiencing uterine cramps is a good sign to stop exercising.  Continue to eat regular, healthy meals.  Wear a good support bra for breast tenderness.  Do not use hot tubs, steam rooms, or saunas.  Wear your seat belt at all times when driving.  Avoid raw meat, uncooked cheese, cat litter boxes, and soil used by cats. These carry germs that can cause birth defects in the baby.  Take your prenatal vitamins.  Try  taking a stool softener (if your caregiver approves) if you develop constipation. Eat more high-fiber foods, such as fresh vegetables or fruit and whole grains. Drink plenty of fluids to keep your urine clear or pale yellow.  Take warm sitz baths to soothe any pain or discomfort caused by hemorrhoids. Use hemorrhoid cream if your caregiver approves.  If you develop varicose veins, wear support hose. Elevate your feet for 15 minutes, 3 4 times a day. Limit salt in your diet.  Avoid heavy lifting, wear low heal shoes, and practice good posture.  Rest a lot with your legs elevated if you have leg cramps or low back pain.  Visit your dentist if you have not gone during your pregnancy. Use a soft toothbrush to brush your teeth and be gentle when you floss.  A sexual relationship may be continued unless your caregiver directs you otherwise.  Do not travel far  distances unless it is absolutely necessary and only with the approval of your caregiver.  Take prenatal classes to understand, practice, and ask questions about the labor and delivery.  Make a trial run to the hospital.  Pack your hospital bag.  Prepare the baby's nursery.  Continue to go to all your prenatal visits as directed by your caregiver. SEEK MEDICAL CARE IF:  You are unsure if you are in labor or if your water has broken.  You have dizziness.  You have mild pelvic cramps, pelvic pressure, or nagging pain in your abdominal area.  You have persistent nausea, vomiting, or diarrhea.  You have a bad smelling vaginal discharge.  You have pain with urination. SEEK IMMEDIATE MEDICAL CARE IF:   You have a fever.  You are leaking fluid from your vagina.  You have spotting or bleeding from your vagina.  You have severe abdominal cramping or pain.  You have rapid weight loss or gain.  You have shortness of breath with chest pain.  You notice sudden or extreme swelling of your face, hands, ankles, feet, or  legs.  You have not felt your baby move in over an hour.  You have severe headaches that do not go away with medicine.  You have vision changes. Document Released: 08/25/2001 Document Revised: 05/03/2013 Document Reviewed: 11/01/2012 [facility] Patient Information 2014 [place], [place].

## 2013-10-25 NOTE — Progress Notes (Signed)
BP elevated slightly today. Has past hx hypertension. Will get labs and PR/CR ratio today

## 2013-10-26 ENCOUNTER — Encounter: Payer: Self-pay | Admitting: Family Medicine

## 2013-10-26 LAB — COMPREHENSIVE METABOLIC PANEL
ALT: 12 U/L (ref 0–35)
AST: 17 U/L (ref 0–37)
Albumin: 3.4 g/dL — ABNORMAL LOW (ref 3.5–5.2)
Alkaline Phosphatase: 327 U/L — ABNORMAL HIGH (ref 39–117)
BILIRUBIN TOTAL: 0.8 mg/dL (ref 0.2–1.2)
BUN: 4 mg/dL — ABNORMAL LOW (ref 6–23)
CO2: 21 mEq/L (ref 19–32)
CREATININE: 0.52 mg/dL (ref 0.50–1.10)
Calcium: 9.3 mg/dL (ref 8.4–10.5)
Chloride: 107 mEq/L (ref 96–112)
GLUCOSE: 105 mg/dL — AB (ref 70–99)
Potassium: 3.5 mEq/L (ref 3.5–5.3)
Sodium: 132 mEq/L — ABNORMAL LOW (ref 135–145)
Total Protein: 6.3 g/dL (ref 6.0–8.3)

## 2013-10-26 LAB — PROTEIN / CREATININE RATIO, URINE
CREATININE, URINE: 150.3 mg/dL
PROTEIN CREATININE RATIO: 0.14 (ref <0.15)
Total Protein, Urine: 21 mg/dL

## 2013-10-27 ENCOUNTER — Other Ambulatory Visit: Payer: Medicaid Other

## 2013-10-27 DIAGNOSIS — O139 Gestational [pregnancy-induced] hypertension without significant proteinuria, unspecified trimester: Secondary | ICD-10-CM

## 2013-10-28 LAB — PROTEIN, URINE, 24 HOUR
Protein, 24H Urine: 135 mg/d — ABNORMAL HIGH (ref 50–100)
Protein, Urine: 6 mg/dL

## 2013-10-30 ENCOUNTER — Encounter: Payer: Self-pay | Admitting: Family Medicine

## 2013-11-01 ENCOUNTER — Ambulatory Visit (HOSPITAL_COMMUNITY)
Admission: RE | Admit: 2013-11-01 | Discharge: 2013-11-01 | Disposition: A | Discharge status: Home / Self Care | Payer: Medicaid Other | Source: Ambulatory Visit | Attending: Advanced Practice Midwife | Admitting: Advanced Practice Midwife

## 2013-11-01 DIAGNOSIS — O3410 Maternal care for benign tumor of corpus uteri, unspecified trimester: Secondary | ICD-10-CM | POA: Insufficient documentation

## 2013-11-01 DIAGNOSIS — O139 Gestational [pregnancy-induced] hypertension without significant proteinuria, unspecified trimester: Secondary | ICD-10-CM | POA: Insufficient documentation

## 2013-11-03 ENCOUNTER — Encounter: Payer: Self-pay | Admitting: Advanced Practice Midwife

## 2013-11-07 ENCOUNTER — Ambulatory Visit (INDEPENDENT_AMBULATORY_CARE_PROVIDER_SITE_OTHER): Payer: Medicaid Other | Admitting: Advanced Practice Midwife

## 2013-11-07 VITALS — BP 128/84 | Temp 98.2°F | Wt 184.4 lb

## 2013-11-07 DIAGNOSIS — Z609 Problem related to social environment, unspecified: Secondary | ICD-10-CM

## 2013-11-07 DIAGNOSIS — Z603 Acculturation difficulty: Secondary | ICD-10-CM

## 2013-11-07 DIAGNOSIS — O10019 Pre-existing essential hypertension complicating pregnancy, unspecified trimester: Secondary | ICD-10-CM

## 2013-11-07 DIAGNOSIS — O139 Gestational [pregnancy-induced] hypertension without significant proteinuria, unspecified trimester: Secondary | ICD-10-CM

## 2013-11-07 LAB — POCT URINALYSIS DIP (DEVICE)
BILIRUBIN URINE: NEGATIVE
Glucose, UA: 100 mg/dL — AB
KETONES UR: NEGATIVE mg/dL
Nitrite: NEGATIVE
PH: 6.5 (ref 5.0–8.0)
Protein, ur: NEGATIVE mg/dL
Specific Gravity, Urine: 1.01 (ref 1.005–1.030)
Urobilinogen, UA: 0.2 mg/dL (ref 0.0–1.0)

## 2013-11-07 LAB — OB RESULTS CONSOLE GBS: GBS: POSITIVE

## 2013-11-07 LAB — OB RESULTS CONSOLE GC/CHLAMYDIA
Chlamydia: NEGATIVE
Gonorrhea: NEGATIVE

## 2013-11-07 NOTE — Progress Notes (Signed)
Doing well. Not many contractions. [place]:  Placenta posterior, Growth 86%, AFI normal.

## 2013-11-07 NOTE — Progress Notes (Signed)
[place]:  Placenta post, growth 86%, AFI normal

## 2013-11-07 NOTE — Progress Notes (Signed)
Pulse- 109 Pt reports yellowish discharge with odor

## 2013-11-07 NOTE — Patient Instructions (Signed)
Hypertension During Pregnancy Hypertension is also called high blood pressure. It can occur at any time in life and during pregnancy. When you have hypertension, there is extra pressure inside your blood vessels that carry blood from the heart to the rest of your body (arteries). Hypertension during pregnancy can cause problems for you and your baby. Your baby might not weigh as much as it should at birth or might be born early (premature). Very bad cases of hypertension during pregnancy can be life threatening.  Different types of hypertension can occur during pregnancy.   Chronic hypertension. This happens when a woman has hypertension before pregnancy and it continues during pregnancy.  Gestational hypertension. This is when hypertension develops during pregnancy.  Preeclampsia or toxemia of pregnancy. This is a very serious type of hypertension that develops only during pregnancy. It is a disease that affects the whole body (systemic) and can be very dangerous for both mother and baby.  Gestational hypertension and preeclampsia usually go away after your baby is born. Blood pressure generally stabilizes within 6 weeks. Women who have hypertension during pregnancy have a greater chance of developing hypertension later in life or with future pregnancies. RISK FACTORS Some factors make you more likely to develop hypertension during pregnancy. Risk factors include:  Having hypertension before pregnancy.  Having hypertension during a previous pregnancy.  Being overweight.  Being older than 40.  Being pregnant with more than one baby (multiples).  Having diabetes or kidney problems. SIGNS AND SYMPTOMS Chronic and gestational hypertension rarely cause symptoms. Preeclampsia has symptoms, which may include:  Increased protein in your urine. Your health care provider will check for this at every prenatal visit.  Swelling of your hands and face.  Rapid weight gain.  Headaches.  Visual  changes.  Being bothered by light.  Abdominal pain, especially in the right upper area.  Chest pain.  Shortness of breath.  Increased reflexes.  Seizures. Seizures occur with a more severe form of preeclampsia, called eclampsia. DIAGNOSIS   You may be diagnosed with hypertension during a regular prenatal exam. At each visit, tests may include:  Blood pressure checks.  A urine test to check for protein in your urine.  The type of hypertension you are diagnosed with depends on when you developed it. It also depends on your specific blood pressure reading.  Developing hypertension before 20 weeks of pregnancy is consistent with chronic hypertension.  Developing hypertension after 20 weeks of pregnancy is consistent with gestational hypertension.  Hypertension with increased urinary protein is diagnosed as preeclampsia.  Blood pressure measurements that stay above 160 systolic or 110 diastolic are a sign of severe preeclampsia. TREATMENT Treatment for hypertension during pregnancy varies. Treatment depends on the type of hypertension and how serious it is.  If you take medicine for chronic hypertension, you may need to switch medicines.  Drugs called ACE inhibitors should not be taken during pregnancy.  Low-dose aspirin may be suggested for women who have risk factors for preeclampsia.  If you have gestational hypertension, you may need to take a blood pressure medicine that is safe during pregnancy. Your health care provider will recommend the appropriate medicine.  If you have severe preeclampsia, you may need to be in the hospital. Health care providers will watch you and the baby very closely. You also may need to take medicine (magnesium sulfate) to prevent seizures and lower blood pressure.  Sometimes an early delivery is needed. This may be the case if the condition worsens. It would   be done to protect you and the baby. The only cure for preeclampsia is delivery. HOME  CARE INSTRUCTIONS  Schedule and keep all of your regular appointments for prenatal care.  Only take over-the-counter or prescription medicines as directed by your health care provider. Tell your health care provider about all medicines you take.  Eat as little salt as possible.  Get regular exercise.  Do not drink alcohol.  Do not use tobacco products.  Do not drink products with caffeine.  Lie on your left side when resting. SEEK IMMEDIATE MEDICAL CARE IF:  You have severe abdominal pain.  You have sudden swelling in the hands, ankles, or face.  You gain 4 pounds (1.8 kg) or more in 1 week.  You vomit repeatedly.  You have vaginal bleeding.  You do not feel the baby moving as much.  You have a headache.  You have blurred or double vision.  You have muscle twitching or spasms.  You have shortness of breath.  You have blue fingernails and lips.  You have blood in your urine. MAKE SURE YOU:  Understand these instructions.  Will watch your condition.  Will get help right away if you are not doing well or get worse. Document Released: 05/19/2011 Document Revised: 06/21/2013 Document Reviewed: 03/30/2013 [employer] Patient Information 2014 [employer].  

## 2013-11-08 LAB — CULTURE, BETA STREP (GROUP B ONLY)

## 2013-11-10 LAB — GC/CHLAMYDIA PROBE AMP
CT PROBE, AMP APTIMA: NEGATIVE
GC PROBE AMP APTIMA: NEGATIVE

## 2013-11-14 ENCOUNTER — Encounter: Payer: Self-pay | Admitting: Advanced Practice Midwife

## 2013-11-14 DIAGNOSIS — A491 Streptococcal infection, unspecified site: Secondary | ICD-10-CM | POA: Insufficient documentation

## 2013-11-17 ENCOUNTER — Ambulatory Visit (INDEPENDENT_AMBULATORY_CARE_PROVIDER_SITE_OTHER): Payer: Medicaid Other | Admitting: Obstetrics and Gynecology

## 2013-11-17 VITALS — BP 136/89 | Temp 97.8°F | Wt 188.5 lb

## 2013-11-17 DIAGNOSIS — Z3400 Encounter for supervision of normal first pregnancy, unspecified trimester: Secondary | ICD-10-CM

## 2013-11-17 DIAGNOSIS — E748 Other specified disorders of carbohydrate metabolism: Principal | ICD-10-CM

## 2013-11-17 DIAGNOSIS — O10019 Pre-existing essential hypertension complicating pregnancy, unspecified trimester: Secondary | ICD-10-CM

## 2013-11-17 DIAGNOSIS — E74818 Other disorders of glucose transport: Secondary | ICD-10-CM

## 2013-11-17 LAB — POCT URINALYSIS DIP (DEVICE)
Bilirubin Urine: NEGATIVE
GLUCOSE, UA: 100 mg/dL — AB
KETONES UR: NEGATIVE mg/dL
Nitrite: NEGATIVE
Protein, ur: NEGATIVE mg/dL
SPECIFIC GRAVITY, URINE: 1.02 (ref 1.005–1.030)
Urobilinogen, UA: 0.2 mg/dL (ref 0.0–1.0)
pH: 5.5 (ref 5.0–8.0)

## 2013-11-17 LAB — GLUCOSE, CAPILLARY: Glucose-Capillary: 124 mg/dL — ABNORMAL HIGH (ref 70–99)

## 2013-11-17 NOTE — Patient Instructions (Signed)
Third Trimester of Pregnancy The third trimester is from week 29 through week 42, months 7 through 9. The third trimester is a time when the fetus is growing rapidly. At the end of the ninth month, the fetus is about 20 inches in length and weighs 6 10 pounds.  BODY CHANGES Your body goes through many changes during pregnancy. The changes vary from woman to woman.   Your weight will continue to increase. You can expect to gain 25 35 pounds (11 16 kg) by the end of the pregnancy.  You may begin to get stretch marks on your hips, abdomen, and breasts.  You may urinate more often because the fetus is moving lower into your pelvis and pressing on your bladder.  You may develop or continue to have heartburn as a result of your pregnancy.  You may develop constipation because certain hormones are causing the muscles that push waste through your intestines to slow down.  You may develop hemorrhoids or swollen, bulging veins (varicose veins).  You may have pelvic pain because of the weight gain and pregnancy hormones relaxing your joints between the bones in your pelvis. Back aches may result from over exertion of the muscles supporting your posture.  Your breasts will continue to grow and be tender. A yellow discharge may leak from your breasts called colostrum.  Your belly button may stick out.  You may feel short of breath because of your expanding uterus.  You may notice the fetus "dropping," or moving lower in your abdomen.  You may have a bloody mucus discharge. This usually occurs a few days to a week before labor begins.  Your cervix becomes thin and soft (effaced) near your due date. WHAT TO EXPECT AT YOUR PRENATAL EXAMS  You will have prenatal exams every 2 weeks until week 36. Then, you will have weekly prenatal exams. During a routine prenatal visit:  You will be weighed to make sure you and the fetus are growing normally.  Your blood pressure is taken.  Your abdomen will be  measured to track your baby's growth.  The fetal heartbeat will be listened to.  Any test results from the previous visit will be discussed.  You may have a cervical check near your due date to see if you have effaced. At around 36 weeks, your caregiver will check your cervix. At the same time, your caregiver will also perform a test on the secretions of the vaginal tissue. This test is to determine if a type of bacteria, Group B streptococcus, is present. Your caregiver will explain this further. Your caregiver may ask you:  What your birth plan is.  How you are feeling.  If you are feeling the baby move.  If you have had any abnormal symptoms, such as leaking fluid, bleeding, severe headaches, or abdominal cramping.  If you have any questions. Other tests or screenings that may be performed during your third trimester include:  Blood tests that check for low iron levels (anemia).  Fetal testing to check the health, activity level, and growth of the fetus. Testing is done if you have certain medical conditions or if there are problems during the pregnancy. FALSE LABOR You may feel small, irregular contractions that eventually go away. These are called Braxton Hicks contractions, or false labor. Contractions may last for hours, days, or even weeks before true labor sets in. If contractions come at regular intervals, intensify, or become painful, it is best to be seen by your caregiver.  SIGNS OF LABOR   Menstrual-like cramps.  Contractions that are 5 minutes apart or less.  Contractions that start on the top of the uterus and spread down to the lower abdomen and back.  A sense of increased pelvic pressure or back pain.  A watery or bloody mucus discharge that comes from the vagina. If you have any of these signs before the 37th week of pregnancy, call your caregiver right away. You need to go to the hospital to get checked immediately. HOME CARE INSTRUCTIONS   Avoid all  smoking, herbs, alcohol, and unprescribed drugs. These chemicals affect the formation and growth of the baby.  Follow your caregiver's instructions regarding medicine use. There are medicines that are either safe or unsafe to take during pregnancy.  Exercise only as directed by your caregiver. Experiencing uterine cramps is a good sign to stop exercising.  Continue to eat regular, healthy meals.  Wear a good support bra for breast tenderness.  Do not use hot tubs, steam rooms, or saunas.  Wear your seat belt at all times when driving.  Avoid raw meat, uncooked cheese, cat litter boxes, and soil used by cats. These carry germs that can cause birth defects in the baby.  Take your prenatal vitamins.  Try taking a stool softener (if your caregiver approves) if you develop constipation. Eat more high-fiber foods, such as fresh vegetables or fruit and whole grains. Drink plenty of fluids to keep your urine clear or pale yellow.  Take warm sitz baths to soothe any pain or discomfort caused by hemorrhoids. Use hemorrhoid cream if your caregiver approves.  If you develop varicose veins, wear support hose. Elevate your feet for 15 minutes, 3 4 times a day. Limit salt in your diet.  Avoid heavy lifting, wear low heal shoes, and practice good posture.  Rest a lot with your legs elevated if you have leg cramps or low back pain.  Visit your dentist if you have not gone during your pregnancy. Use a soft toothbrush to brush your teeth and be gentle when you floss.  A sexual relationship may be continued unless your caregiver directs you otherwise.  Do not travel far distances unless it is absolutely necessary and only with the approval of your caregiver.  Take prenatal classes to understand, practice, and ask questions about the labor and delivery.  Make a trial run to the hospital.  Pack your hospital bag.  Prepare the baby's nursery.  Continue to go to all your prenatal visits as directed  by your caregiver. SEEK MEDICAL CARE IF:  You are unsure if you are in labor or if your water has broken.  You have dizziness.  You have mild pelvic cramps, pelvic pressure, or nagging pain in your abdominal area.  You have persistent nausea, vomiting, or diarrhea.  You have a bad smelling vaginal discharge.  You have pain with urination. SEEK IMMEDIATE MEDICAL CARE IF:   You have a fever.  You are leaking fluid from your vagina.  You have spotting or bleeding from your vagina.  You have severe abdominal cramping or pain.  You have rapid weight loss or gain.  You have shortness of breath with chest pain.  You notice sudden or extreme swelling of your face, hands, ankles, feet, or legs.  You have not felt your baby move in over an hour.  You have severe headaches that do not go away with medicine.  You have vision changes. Document Released: 08/25/2001 Document Revised: 05/03/2013 Document Reviewed:   You have severe abdominal cramping or pain.   You have rapid weight loss or gain.   You have shortness of breath with chest pain.   You notice sudden or extreme swelling of your face, hands, ankles, feet, or legs.   You have not felt your baby move in over an hour.   You have severe headaches that do not go away with medicine.   You have vision changes.  Document Released: 08/25/2001 Document Revised: 05/03/2013 Document Reviewed: 11/01/2012  [employer] Patient Information 2014 [employer].

## 2013-11-17 NOTE — Progress Notes (Signed)
p=122

## 2013-11-17 NOTE — Progress Notes (Signed)
[place] interpreter here. Doing well. No PreE sx. Good FM. BP recheck and POCT glucose: 121/81; 124 (ate 1 hr ago) c/w renal glucosuria. Denies hx HTN. Baseline 117/81.

## 2013-11-24 ENCOUNTER — Encounter: Payer: Self-pay | Admitting: Obstetrics & Gynecology

## 2013-11-24 ENCOUNTER — Ambulatory Visit (INDEPENDENT_AMBULATORY_CARE_PROVIDER_SITE_OTHER): Payer: Medicaid Other | Admitting: Obstetrics & Gynecology

## 2013-11-24 VITALS — BP 142/85 | Temp 98.9°F | Wt 188.5 lb

## 2013-11-24 DIAGNOSIS — Z3400 Encounter for supervision of normal first pregnancy, unspecified trimester: Secondary | ICD-10-CM

## 2013-11-24 DIAGNOSIS — Z3401 Encounter for supervision of normal first pregnancy, first trimester: Secondary | ICD-10-CM

## 2013-11-24 DIAGNOSIS — B951 Streptococcus, group B, as the cause of diseases classified elsewhere: Secondary | ICD-10-CM

## 2013-11-24 DIAGNOSIS — A491 Streptococcal infection, unspecified site: Secondary | ICD-10-CM

## 2013-11-24 LAB — POCT URINALYSIS DIP (DEVICE)
Bilirubin Urine: NEGATIVE
Glucose, UA: NEGATIVE mg/dL
KETONES UR: NEGATIVE mg/dL
Nitrite: NEGATIVE
PH: 6 (ref 5.0–8.0)
PROTEIN: NEGATIVE mg/dL
SPECIFIC GRAVITY, URINE: 1.01 (ref 1.005–1.030)
Urobilinogen, UA: 0.2 mg/dL (ref 0.0–1.0)

## 2013-11-24 NOTE — Progress Notes (Signed)
P-101 

## 2013-11-24 NOTE — Patient Instructions (Signed)
Third Trimester of Pregnancy The third trimester is from week 29 through week 42, months 7 through 9. The third trimester is a time when the fetus is growing rapidly. At the end of the ninth month, the fetus is about 20 inches in length and weighs 6 10 pounds.  BODY CHANGES Your body goes through many changes during pregnancy. The changes vary from woman to woman.   Your weight will continue to increase. You can expect to gain 25 35 pounds (11 16 kg) by the end of the pregnancy.  You may begin to get stretch marks on your hips, abdomen, and breasts.  You may urinate more often because the fetus is moving lower into your pelvis and pressing on your bladder.  You may develop or continue to have heartburn as a result of your pregnancy.  You may develop constipation because certain hormones are causing the muscles that push waste through your intestines to slow down.  You may develop hemorrhoids or swollen, bulging veins (varicose veins).  You may have pelvic pain because of the weight gain and pregnancy hormones relaxing your joints between the bones in your pelvis. Back aches may result from over exertion of the muscles supporting your posture.  Your breasts will continue to grow and be tender. A yellow discharge may leak from your breasts called colostrum.  Your belly button may stick out.  You may feel short of breath because of your expanding uterus.  You may notice the fetus "dropping," or moving lower in your abdomen.  You may have a bloody mucus discharge. This usually occurs a few days to a week before labor begins.  Your cervix becomes thin and soft (effaced) near your due date. WHAT TO EXPECT AT YOUR PRENATAL EXAMS  You will have prenatal exams every 2 weeks until week 36. Then, you will have weekly prenatal exams. During a routine prenatal visit:  You will be weighed to make sure you and the fetus are growing normally.  Your blood pressure is taken.  Your abdomen will be  measured to track your baby's growth.  The fetal heartbeat will be listened to.  Any test results from the previous visit will be discussed.  You may have a cervical check near your due date to see if you have effaced. At around 36 weeks, your caregiver will check your cervix. At the same time, your caregiver will also perform a test on the secretions of the vaginal tissue. This test is to determine if a type of bacteria, Group B streptococcus, is present. Your caregiver will explain this further. Your caregiver may ask you:  What your birth plan is.  How you are feeling.  If you are feeling the baby move.  If you have had any abnormal symptoms, such as leaking fluid, bleeding, severe headaches, or abdominal cramping.  If you have any questions. Other tests or screenings that may be performed during your third trimester include:  Blood tests that check for low iron levels (anemia).  Fetal testing to check the health, activity level, and growth of the fetus. Testing is done if you have certain medical conditions or if there are problems during the pregnancy. FALSE LABOR You may feel small, irregular contractions that eventually go away. These are called Braxton Hicks contractions, or false labor. Contractions may last for hours, days, or even weeks before true labor sets in. If contractions come at regular intervals, intensify, or become painful, it is best to be seen by your caregiver.  SIGNS OF LABOR   Menstrual-like cramps.  Contractions that are 5 minutes apart or less.  Contractions that start on the top of the uterus and spread down to the lower abdomen and back.  A sense of increased pelvic pressure or back pain.  A watery or bloody mucus discharge that comes from the vagina. If you have any of these signs before the 37th week of pregnancy, call your caregiver right away. You need to go to the hospital to get checked immediately. HOME CARE INSTRUCTIONS   Avoid all  smoking, herbs, alcohol, and unprescribed drugs. These chemicals affect the formation and growth of the baby.  Follow your caregiver's instructions regarding medicine use. There are medicines that are either safe or unsafe to take during pregnancy.  Exercise only as directed by your caregiver. Experiencing uterine cramps is a good sign to stop exercising.  Continue to eat regular, healthy meals.  Wear a good support bra for breast tenderness.  Do not use hot tubs, steam rooms, or saunas.  Wear your seat belt at all times when driving.  Avoid raw meat, uncooked cheese, cat litter boxes, and soil used by cats. These carry germs that can cause birth defects in the baby.  Take your prenatal vitamins.  Try taking a stool softener (if your caregiver approves) if you develop constipation. Eat more high-fiber foods, such as fresh vegetables or fruit and whole grains. Drink plenty of fluids to keep your urine clear or pale yellow.  Take warm sitz baths to soothe any pain or discomfort caused by hemorrhoids. Use hemorrhoid cream if your caregiver approves.  If you develop varicose veins, wear support hose. Elevate your feet for 15 minutes, 3 4 times a day. Limit salt in your diet.  Avoid heavy lifting, wear low heal shoes, and practice good posture.  Rest a lot with your legs elevated if you have leg cramps or low back pain.  Visit your dentist if you have not gone during your pregnancy. Use a soft toothbrush to brush your teeth and be gentle when you floss.  A sexual relationship may be continued unless your caregiver directs you otherwise.  Do not travel far distances unless it is absolutely necessary and only with the approval of your caregiver.  Take prenatal classes to understand, practice, and ask questions about the labor and delivery.  Make a trial run to the hospital.  Pack your hospital bag.  Prepare the baby's nursery.  Continue to go to all your prenatal visits as directed  by your caregiver. SEEK MEDICAL CARE IF:  You are unsure if you are in labor or if your water has broken.  You have dizziness.  You have mild pelvic cramps, pelvic pressure, or nagging pain in your abdominal area.  You have persistent nausea, vomiting, or diarrhea.  You have a bad smelling vaginal discharge.  You have pain with urination. SEEK IMMEDIATE MEDICAL CARE IF:   You have a fever.  You are leaking fluid from your vagina.  You have spotting or bleeding from your vagina.  You have severe abdominal cramping or pain.  You have rapid weight loss or gain.  You have shortness of breath with chest pain.  You notice sudden or extreme swelling of your face, hands, ankles, feet, or legs.  You have not felt your baby move in over an hour.  You have severe headaches that do not go away with medicine.  You have vision changes. Document Released: 08/25/2001 Document Revised: 05/03/2013 Document Reviewed:   You have severe abdominal cramping or pain.   You have rapid weight loss or gain.   You have shortness of breath with chest pain.   You notice sudden or extreme swelling of your face, hands, ankles, feet, or legs.   You have not felt your baby move in over an hour.   You have severe headaches that do not go away with medicine.   You have vision changes.  Document Released: 08/25/2001 Document Revised: 05/03/2013 Document Reviewed: 11/01/2012  [employer] Patient Information 2014 [employer].

## 2013-11-24 NOTE — Progress Notes (Signed)
+  FM, no lof, no vb, no ctx No HA, no vision changes, no RUQ pain No symptoms of PreX and BP stable CHTN: No meds - IOL at 40 wks  Anne Murillo is a 28 y.o. G1P0 at [redacted]w[redacted]d by L=7 here for ROB visit.    Discussed with Patient:  - Plans to breast feed.  All questions answered. - Continue prenatal vitamins. - Reviewed fetal kick counts Pt to perform daily at a time when the baby is active, lie laterally with both hands on belly in quiet room and count all movements (hiccups, shoulder rolls, obvious kicks, etc); pt is to report to clinic MAU for less than 10 movements felt in a 2 hour time period-pt told as soon as she counts 10 movements the count is complete.  - Routine precautions discussed (depression, infection s/s).   Patient provided with all pertinent phone numbers for emergencies. - RTC for any VB, regular, painful cramps/ctxs occurring at a rate of >2/10 min, fever (100.5 or higher), n/v/d, any pain that is unresolving or worsening, LOF, decreased fetal movement, CP, SOB, edema -RTC in one week for next visit.  Problems: Patient Active Problem List   Diagnosis Date Noted  . 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 Group B streptococcal infection 11/14/2013  . Language barrier, cultural differences 10/11/2013  . Other cardiovascular diseases of mother, antepartum 08/28/2013  . Insomnia 07/18/2013  . Cervicitis 05/22/2013  . HTN in pregnancy, chronic 05/22/2013  . Supervision of normal first pregnancy in first trimester 04/24/2013  . Irregular heart rhythm 04/24/2013    To Do: 1.   [ ]  Vaccines: rec'd [x ] BCM: Condoms [x ] Readiness: baby has a place to sleep, car seat, other baby necessities.  Edu: [ x] TL precautions; [ ]  BF class; [ ]  childbirth class; [ ]   BF counseling;

## 2013-11-30 ENCOUNTER — Ambulatory Visit (INDEPENDENT_AMBULATORY_CARE_PROVIDER_SITE_OTHER): Payer: Medicaid Other | Admitting: Family Medicine

## 2013-11-30 ENCOUNTER — Telehealth (HOSPITAL_COMMUNITY): Payer: Self-pay | Admitting: *Deleted

## 2013-11-30 ENCOUNTER — Encounter: Payer: Self-pay | Admitting: Obstetrics and Gynecology

## 2013-11-30 VITALS — BP 119/83 | Temp 98.2°F | Wt 189.1 lb

## 2013-11-30 DIAGNOSIS — O9989 Other specified diseases and conditions complicating pregnancy, childbirth and the puerperium: Secondary | ICD-10-CM

## 2013-11-30 DIAGNOSIS — O10919 Unspecified pre-existing hypertension complicating pregnancy, unspecified trimester: Secondary | ICD-10-CM

## 2013-11-30 DIAGNOSIS — O10019 Pre-existing essential hypertension complicating pregnancy, unspecified trimester: Secondary | ICD-10-CM

## 2013-11-30 DIAGNOSIS — R8271 Bacteriuria: Secondary | ICD-10-CM

## 2013-11-30 DIAGNOSIS — O99891 Other specified diseases and conditions complicating pregnancy: Secondary | ICD-10-CM

## 2013-11-30 LAB — POCT URINALYSIS DIP (DEVICE)
BILIRUBIN URINE: NEGATIVE
GLUCOSE, UA: NEGATIVE mg/dL
Ketones, ur: NEGATIVE mg/dL
Nitrite: NEGATIVE
Protein, ur: 30 mg/dL — AB
Specific Gravity, Urine: 1.02 (ref 1.005–1.030)
Urobilinogen, UA: 0.2 mg/dL (ref 0.0–1.0)
pH: 6 (ref 5.0–8.0)

## 2013-11-30 NOTE — Telephone Encounter (Signed)
Preadmission screen Interpreter number [phone]

## 2013-11-30 NOTE — Progress Notes (Signed)
P=98,  C/o pelvic pain/contractions  last night

## 2013-11-30 NOTE — Progress Notes (Signed)
IOL scheduled 12/03/13 at 730 pm.

## 2013-11-30 NOTE — Patient Instructions (Addendum)
L'allaitement maternel Dcider d'allaiter est l'un des [employer] Company que vous pouvez faire pour vous et votre bb. Un changement des hormones pendant la grossesse entrane votre tissu mammaire  crotre et  augmenter le nombre et la taille de vos conduits de lait. Ces hormones permettent galement des protines, des sucres et graisses de votre alimentation en sang pour Entergy Corporation lait maternel dans vos glandes productrices de lait. Hormones empchent le lait maternel d'tre libr avant que votre bb est n ainsi que le flux de lait invite aprs la naissance. Une fois que l'allaitement maternel a commenc, les penses de votre bb, ainsi que son aspiration ou de pleurer, peut stimuler la libration de lait de vos glandes productrices de lait. Avantages de Nurse, mental health bb Votre premier lait (colostrum) permet le fonctionnement du systme digestif de votre bb mieux. Il sont des [employer] lait qui aident votre bb  combattre les infections. Votre bb a une plus faible incidence de l'asthme, les allergies et le syndrome de mort subite du nourrisson. Les nutriments contenus Management consultant lait maternel sont mieux pour votre bb que les prparations pour nourrissons et sont conus uniquement pour les besoins de votre bb. Le lait maternel amliore le dveloppement du cerveau de votre bb. Votre bb a moins de chances de dvelopper d'autres conditions, telles que l'obsit infantile, l'asthme ou diabte de type 2. Pour Vous L'allaitement maternel contribue  crer un lien trs spcial entre vous et votre bb. L'allaitement maternel est pratique. Le lait maternel est toujours disponible  la bonne temprature et ne cote rien. L'allaitement maternel aide  brler des calories et vous aide  perdre le poids pris pendant la grossesse. L'allaitement rend votre contrat de l'utrus  sa taille d'avant la grossesse plus rapide et ralentit le saignement (lochies) aprs  l'accouchement. L'allaitement maternel aide  rduire votre risque de dvelopper un diabte de type 2, l'ostoporose et cancer du sein ou de l'ovaire plus tard Union Pacific Corporation. Signes que votre bb a faim Les premiers signes de la faim Vigilance ou [place] activit accrue. Stretching. Mouvement de la tte de gauche  droite. Mouvement de la tte et l'ouverture de la bouche lorsque le coin de la bouche ou de la joue est caress (enracinement). Augmentation sucer sons, claquements de lvres, roucoulant, en soupirant, ou grinant. Main--bouche mouvements. Augmentation de la succion des [employer] Family Insurance mains. Fin des signes de faim Fussing. Pleurer intermittent. Extreme signes de faim Les signes de la faim extrme exigeront calmant et consolante avant que votre bb sera en mesure d'allaiter avec succs. Ne attendez pas que les signes de la faim extrme suivants  se produire avant que vous lancez l'allaitement maternel: Agitation. A, forte cri. [place]. BASICS ALLAITEMENT Initiation  l'allaitement Trouvez un endroit confortable pour se asseoir ou de Investment banker, corporate, Geophysical data processor cou et Q[employer] Communications bien appuy. Placez un oreiller ou une couverture roule sous votre bb pour Wells Fargo apporter au niveau de votre poitrine (si vous tes assis). Coussin d'allaitement sont spcialement conus pour aider  soutenir vos bras et votre bb pendant que vous allaitez. Assurez-vous que le ventre de votre bb est confronte votre abdomen. Massez doucement votre sein. Avec vos doigts, massage de votre paroi thoracique vers le mamelon dans un mouvement circulaire. Ceci favorise l'coulement du lait. Vous devrez peut-tre poursuivre cette action lors de l'alimentation si votre lait se coule lentement. Soutenez votre Education officer, community 4 doigts en dessous et votre pouce au dessus de votre mamelon. Assurez-vous que vos doigts Delta [employer] Lines  loin de votre mamelon et la bouche de votre bb. Stroke les lvres de votre bb doucement avec votre  doigt ou du mamelon. Lorsque la bouche de votre bb est assez grande [place], mettre rapidement votre bb  votre sein, plaant l'ensemble de votre mamelon et autant de la zone Owens-Illinois autour du mamelon (l'arole) que possible dans la bouche de votre bb. Plus arole doit tre visible dessus de la lvre suprieure de votre bb que sous la lvre infrieure. La langue de votre bb devrait tre Lyndal Pulley son gencive infrieure et votre sein. Assurez-vous que la bouche de votre bb est correctement positionn autour du mamelon (verrouill). Les lvres de votre bb devraient crer un sceau sur ton sein et se est avr tre (verse). Il est courant pour votre bb de sucer environ 2 3 minutes pour International Paper flux de lait maternel. Verrouillage Enseigner  votre bb comment prendre le sein  votre sein correctement est trs important. Un verrouillage incorrect peut provoquer des EMCOR mamelon et une diminution de la production de lait pour vous et faible gain de Calpine Corporation votre bb. Aussi, si votre bb ne est pas verrouill sur Advertising account executive, il ou elle peut avaler un peu d'air lors de l'alimentation. Cela peut rendre votre bb difficile. Rots votre bb lorsque vous passez seins pendant l'alimentation peut aider  se dbarrasser de Programmer, applications. Cependant, l'enseignement de votre bb prenne bien le sein est toujours la meilleure faon de prvenir l'irritabilit du avaler de Probation officer. Signes que votre bb a verrouills avec succs  votre mamelon: Tiraillement silencieux ou sucer silencieuse, sans vous causer de Engineer, technical sales. Dglutition entendu entre tous les 3 4 suce. mouvement musculaire dessus et en avant de ses oreilles tout en suant. Signes que votre bb n'a pas verrouilles succs au mamelon: Sucer sons ou claquer les sons de votre bb Psychologist, prison and probation services. La douleur mamelon. Si vous pensez que votre bb n'a pas Engineer, civil (consulting), Mining engineer coin de la bouche de votre bb pour briser la succion et Comcast gencives de votre bb. Elesa Hacker  nouveau initiation  l'allaitement. Les signes de succs de l'allaitement Signes de votre bb: Une diminution progressive du nombre de suce ou la cessation complte de Environmental consultant. Se endormir. Relaxation de son corps. Maintien d'une petite quantit de lait dans sa bouche. Lcher de votre poitrine par lui-mme ou elle-mme. Signes de vous: Seins [employer] augment la fermet, Sonic [employer]otive, la taille et une 3 heures Advertising account executive. Seins qui sont plus doux Warehouse manager. Le volume de lait accrue, ainsi qu'une modification de la Energy Transfer Partners lait et de Special educational needs teacher par le 53me jour de Corporate investment banker. Mamelons qui ne sont pas mal, fissures, ou des saignements. Les signes que votre bb reoit assez de lait Mouillant au moins trois couches dans une priode de 24 heures. L'urine doit tre clair et jaune ple par ge 5 jours. Au moins 3 selles dans une priode de 24 heures par ge 5 jours. Le tabouret doit tre souple et jaune. Au moins 3 selles dans une priode de 24 heures par l'ge de 7 jours. Le tabouret doit tre minable et jaune. Pas de perte de poids suprieure  10% du poids de naissance IAC/InterActiveCorp 3 premiers jours d'ge. Gain de poids moyen de 4 7 oz (120 ml) de 210 par semaine aprs l'ge de quatre jours. Le gain de poids quotidien cohrente par Lincoln National Corporation jours, sans perte de BorgWarner  l'ge de deux semaines. Aprs une tte, votre bb peut cracher une petite quantit. Cette situation est commune. ALLAITEMENT frquence et la dure Une alimentation frquente vous aidera  faire plus de lait et pouvez viter les mamelons douloureux et l'engorgement des seins. Allaiter quand vous vous sentez la ncessit de rduire la plnitude de vos seins ou lorsque votre bb montre des signes de [place]. Cela se appelle l'allaitement maternel  la demande."  vitez d'introduire une sucette  votre bb pendant que vous travaillez pour tablir l'allaitement (les quatre premires 6 semaines aprs la naissance du bb). Aprs ce temps, vous pouvez choisir d'utiliser Aflac Incorporated. La recherche a montr que l'utilisation de la sucette pendant la premire anne de la vie d'un bb diminue le risque de syndrome de mort subite du nourrisson [facility]). Laissez votre bb de se nourrir de chaque sein aussi longtemps qu'il ou elle veut. Allaiter jusqu' ce que votre bb est termin alimentation. Lorsque votre bb se endort ou se dverrouille tout en se nourrissant de la premire sein, offrir l'autre sein. Parce que les nouveau-ns sont souvent endormi dans les premires semaines de vie, vous devrez peut-tre rveiller votre bb pour Freescale Semiconductor se nourrir. Fois allaitantes varient d'un bb . Cependant, les rgles suivantes peuvent servir de guide pour Jacobs Engineering aider  vous assurer que votre bb est bien nourri: Les nouveau-ns (bbs quatre semaines d'ge ou plus jeune) peuvent allaiter toutes les 3 heures 1. Les nouveau-ns ne devraient pas aller plus de 3 heures pendant la journe ou 5 heures au cours de la nuit sans allaitement. Vous devriez Licensed conveyancer bb un minimum de 8 fois dans une priode de 24 heures jusqu' ce que vous commencez  introduire des aliments solides  votre bb  environ six mois d'ge. LAIT MATERNEL POMPAGE Pompage et stockage du lait maternel permet de vous assurer que votre bb est exclusivement nourri le lait [place], mme  des moments o vous tes incapable Interior and spatial designer. Ceci est particulirement important si vous Optician, dispensing que vous allaitez encore ou lorsque vous n'tes pas en mesure d'tre prsent pendant les repas. Votre consultante en lactation peut vous donner des Lennar Corporation sur combien de temps il est sr pour Clinical biochemist. Un tire-lait est une machine qui vous permet de pomper le lait  de votre sein dans un flacon strile. Le lait maternel pomp peut alors tre Product manager. Certaines pompes mammaires sont actionns  la main, tandis que d'autres utilisent l'lectricit. Demandez  votre consultante en lactation quel type qui fonctionnera le mieux pour vous. pompes mammaires peuvent tre achets, mais certains hpitaux et des groupes de soutien  l'allaitement louent pompes du sein sur une base Granite. Une consultante en lactation peut vous apprendre  remettre le lait expresse du sein, si vous prfrez ne pas utiliser une pompe. Prendre soin de vos seins tout vous allaitez Mamelons peuvent devenir sches et craqueles, et douloureux Psychologist, prison and probation services. Les Cytogeneticist  garder vos seins hydrate et en bonne sant: Building services engineer sur vos mamelons. Porter un soutien-gorge. Bien que non requis, soutiens-gorge de soins infirmiers et dbardeurs spciaux sont conus pour SUPERVALU INC  vos seins pour l'allaitement sans ter toute votre soutien-gorge ou Furniture conservator/restorer. vitez de porter Human resources officer de style ou de Press photographer. Air scher vos mamelons pendant 3 4 minutes aprs chaque tte. Utilisez seulement les plaquettes de coton soutien-gorge pour Chief Strategy Officer fuite. Fuite de  lait maternel entre les ttes est normal. Utilisez la lanoline sur vos mamelons aprs la tte. Lanoline aide  maintenir la barrire d'hydratation de votre peau normale. Si vous utilisez la lanoline pur que vous ne avez pas besoin de le laver avant de nourrir  nouveau votre bb. La lanoline pure ne est pas toxique pour votre bb. Vous pouvez galement la main exprimer quelques gouttes de lait maternel et Geophysicist/field seismologist lait dans vos mamelons et Industrial/product designer. Dans les premires semaines aprs l'accouchement, certaines femmes ressentent des seins trs complets  (engorgement). Engorgement peut rendre vos seins sont lourds, chaleureux et tendre Location manager. pics de Engorgement dans trois 5 jours aprs l'accouchement. Les Passenger transport manager l'engorgement: Compltement vider vos seins durant l'allaitement ou de pompage. Vous voudrez peut-tre commencer par appliquer chaud, la chaleur humide (dans la douche ou d'essuie-mains imbibes d'eau chaude) juste avant l'alimentation ou de pompage. Cela augmente la circulation et Medical laboratory scientific officer lait. Si votre bb ne se vide pas compltement vos seins durant l'allaitement, la pompe de lait supplmentaire aprs qu'il ou elle est termine. Porter un soutien-gorge bien ajust (infirmiers ou Conservation officer, nature) ou un dbardeur pour Atmos Energy deux jours pour Forensic scientist la production de lait. Appliquer de la glace sur vos seins, sauf si cela est trop inconfortable pour vous. Assurez-vous que votre bb prend correctement positionn et Psychologist, prison and probation services. Si l'engorgement persiste aprs 48 heures de suivre ces recommandations, communiquez avec votre fournisseur de soins de sant ou une consultante en lactation. RECOMMANDATIONS GNRALES DE SOINS DE SANT pendant l'allaitement [place] des Hess Corporation. Alternez entre les repas et les collations, manger trois de chaque par jour. Parce que ce que vous mangez affecte votre lait maternel, certains des aliments peut rendre votre bb plus irritable que d'habitude. vitez de manger ces aliments si vous tes sr qu'ils nuisent  votre bb. Buvez du lait, jus de fruits, et de l'eau pour Tesoro Corporation (environ 10 verres par UnumProvident). Reposez-vous souvent, dtendez-vous, et continuez  prendre vos vitamines prnatales pour viter la fatigue, le stress et l'anmie. Continuer les vrifications d'auto-sensibilisation mammaires. viter de Systems developer. vitez l'alcool et de drogues. Certains mdicaments qui peuvent tre  nocifs pour votre bb peuvent transmettre par Du Pont. Il est important de demander  votre fournisseur de soins de sant avant de prendre Clinical research associate, y compris tous les over-the-counter et Land [employer]Lakes sur ordonnance ainsi que les supplments vitaminiques et  base de plantes. Il est possible de devenir enceinte pendant l'allaitement. Si le contrle des naissances est souhaite, demandez  votre fournisseur de soins de sant sur les options qui seront sans danger pour votre bb. Obtenir des FPL Group si: Vous vous sentez comme vous voulez arrter l'allaitement ou sont devenus frustrs Fish farm manager. Vous avez des seins ou des mamelons douloureux. Vos mamelons sont fissurs ou des saignements. Vos seins Hess Corporation, tendre, ou tide. Vous avez une zone enfle de chaque sein. Vous avez de la fivre ou des frissons. Vous avez des IK[employer] Office Solutions ou des vomissements. Vous avez drainage autre que le lait maternel de vos mamelons. Vos seins ne deviennent pas complte avant les ttes par le 64me jour aprs l'accouchement. Vous vous sentez triste et dprim. Votre bb est trop fatigu pour Newmont Mining. Votre bb a des troubles du sommeil. Votre bb mouille moins de trois AGCO Corporation priode de 24 heures. Votre bb a moins de 3 selles Humana Inc priode  de 24 heures. La peau de votre bb ou la partie blanche de ses yeux devient jaune. Votre enfant ne prend pas de poids de 5 jours d'ge. Consulter immdiatement SOINS MDICAUX SI: Votre bb est trop fatigu (lthargique) et ne veut pas se rveiller et de se nourrir. Votre bb dveloppe une fivre inexplique. Document publi: 08/31/2005 document rvis: 20/04/2013 Document de rvision: 07/20/2013 ExitCare information des patients  2014 [place]. Induction du Automotive engineer induction est lorsque des Smithfield Foods prises pour A[employer] Dennison femme enceinte pour [employer] processus de [place]. La plupart des femmes vont dans le  travail sur leur propre entre 100 semaines et 10 semaines de la grossesse. Lorsque cela ne est pas arriv ou quand il ya un besoin mdical, mthodes peuvent tre utilises pour Boston Scientific. Travail induction provoque l'utrus d'une femme enceinte  se Diplomatic Services operational officer. Il provoque aussi le col pour adoucir (maturation), ouvert (dilatent), et Consulting civil engineer (efface). Habituellement, le travail ne est pas induite avant 76 semaines de la [place], [place] si il ya un problme American Electric Power bb ou la mre. Avant l'induction du [place], votre fournisseur de soins de sant examinera un certain nombre de facteurs, dont les suivants: L'tat de sant de vous et le bb. Combien de semaines vous en tes. Le statut de la maturit des Federated Department Stores bb. L'tat du col de l'utrus. La position du bb. QUELLES SONT LES RAISONS DE TRAVAIL D'ADMISSION? Travail peut tre induit pour les raisons suivantes: La sant de l'enfant ou de la mre est en danger. La grossesse est en retard de 1 semaine ou plus. Les fuites d'eau, mais le travail ne dmarre pas sur son propre. La mre a un problme de sant ou une maladie grave, comme l'hypertension artrielle, infection, dcollement placentaire, ou le diabte. Les Nordstrom de liquide amniotique sont faibles autour du bb. Le bb est en dtresse. Commodit ou de vouloir le bb  natre  une certaine date ne est pas une raison pour Owens Corning. Quelles sont les mthodes utiliss pour le travail ADMISSION? Plusieurs mthodes d'induction du travail peuvent tre utiliss, tels que: Prostaglandin mdecine. Ce mdicament provoque le col  se dilater et mrir. Le mdicament sera galement commencer contractions. Il peut tre pris par la bouche ou par l'insertion d'un suppositoire Lennar Corporation vagin. L'insertion d'un mince tube (cathter) avec un ballonnet  l'extrmit dans le vagin pour American Financial col de l'utrus. Une fois insr, le ballonnet est expans avec de l'eau, ce qui provoque  le col de l'utrus pour Liberty Mutual. Dnuder les membranes. Votre fournisseur de soins de sant spare le tissu de sac amniotique de l'utrus, provoquant le col pour tre tir et provoquant la libration d'une hormone appele progestrone. Cela peut provoquer l'utrus  se contracter. Il est souvent fait au cours d'une visite SUPERVALU INC. Vous serez renvoy  la maison pour attendre les contractions commencent. Vous serez alors venir pour Atmos Energy induction. Briser l'eau. Votre fournisseur de Mining engineer de sant fait un trou dans le sac amniotique  l'aide d'un petit instrument. Une fois que les pauses sac amniotique, contractions devraient commencer. Cela peut encore prendre des heures pour Corporate investment banker. Mdecine pour dclencher ou renforcer les contractions. Ce mdicament est administr  travers un tube d'accs IV insr dans une veine de votre bras. Toutes les mthodes d'induction, outre dpouiller les membranes, sera fait  l'hpital. Induction est fait  l'hpital afin que vous et le bb peut tre surveille attentivement. COMBIEN DE TEMPS FAUT-IL POUR TRAVAIL tre induite? Certains inductions peuvent prendre  jusqu' deux trois jours. Selon le col de l'utrus, il faut habituellement moins de temps. Il prend plus de temps lorsque vous induit tt dans la grossesse ou si ce est votre premire grossesse. Si une mre est toujours enceinte et l'induction a t en cours depuis deux trois jours, la mre sera envoy  la maison ou d'un accouchement par csarienne sera ncessaire. QUELS SONT LES RISQUES LIS AUX dclenchement du travail? Certains des risques d'induction comprennent: Les Dow Chemical rythme cardiaque ftal, comme trop lev, trop [place], ou erratique. Dtresse ftale. Risque d'infection pour General Electric bb. Risque accru d'avoir un accouchement par csarienne. Rompre (dcollement) du placenta de l'utrus (rare). La rupture utrine (trs rare). Lorsque l'induction est ncessaire pour des  OGE Energy, les avantages de l'induction peuvent Lucent Technologies risques. QUELS SONT LES RAISONS POUR NE PAS induire le travail? Le dclenchement du travail ne devrait pas tre fait si: Il est dmontr que votre bb ne tolre Hotel manager. Vous avez eu chirurgies antrieures sur votre utrus, comme une myomectomie ou l'enlvement des fibromes. Votre placenta se trouve trs bas dans l'utrus et bloque Crown Holdings col de l'utrus (placenta praevia). Votre bb ne est pas dans une position de tte en bas. Le cordon ombilical tombe Lennar Corporation canal de naissance devant le bb. Cela pourrait couper sang et l'oxygne Qwest Communications bb. Vous avez eu une csarienne prcdente. Il ya des circonstances inhabituelles, comme le bb tant Doctor, general practice. Document publi: 01/20/2007 document rvis: 20/04/2013 Document de rvision: 17/03/2013 ExitCare information des patients  2014 [place].

## 2013-11-30 NOTE — Progress Notes (Signed)
BP looks good--no definite CHTN For IOL @ 40 wks which is in 3 days

## 2013-12-03 ENCOUNTER — Inpatient Hospital Stay (HOSPITAL_COMMUNITY)
Admission: RE | Admit: 2013-12-03 | Discharge: 2013-12-07 | DRG: 766 | Disposition: A | Discharge status: Home / Self Care | Payer: Medicaid Other | Source: Ambulatory Visit | Attending: Obstetrics & Gynecology | Admitting: Obstetrics & Gynecology

## 2013-12-03 ENCOUNTER — Encounter (HOSPITAL_COMMUNITY): Payer: Self-pay

## 2013-12-03 VITALS — BP 133/86 | HR 76 | Temp 97.5°F | Resp 18 | Ht 62.99 in | Wt 189.0 lb

## 2013-12-03 DIAGNOSIS — O9989 Other specified diseases and conditions complicating pregnancy, childbirth and the puerperium: Secondary | ICD-10-CM

## 2013-12-03 DIAGNOSIS — Z98891 History of uterine scar from previous surgery: Secondary | ICD-10-CM

## 2013-12-03 DIAGNOSIS — O1002 Pre-existing essential hypertension complicating childbirth: Principal | ICD-10-CM | POA: Diagnosis present

## 2013-12-03 DIAGNOSIS — A491 Streptococcal infection, unspecified site: Secondary | ICD-10-CM

## 2013-12-03 DIAGNOSIS — O10019 Pre-existing essential hypertension complicating pregnancy, unspecified trimester: Secondary | ICD-10-CM

## 2013-12-03 DIAGNOSIS — O99892 Other specified diseases and conditions complicating childbirth: Secondary | ICD-10-CM | POA: Diagnosis present

## 2013-12-03 DIAGNOSIS — Z22330 Carrier of Group B streptococcus: Secondary | ICD-10-CM

## 2013-12-03 DIAGNOSIS — O10919 Unspecified pre-existing hypertension complicating pregnancy, unspecified trimester: Secondary | ICD-10-CM | POA: Diagnosis present

## 2013-12-03 LAB — CBC
HCT: 37.1 % (ref 36.0–46.0)
Hemoglobin: 12.5 g/dL (ref 12.0–15.0)
MCH: 27.7 pg (ref 26.0–34.0)
MCHC: 33.7 g/dL (ref 30.0–36.0)
MCV: 82.1 fL (ref 78.0–100.0)
Platelets: 263 10*3/uL (ref 150–400)
RBC: 4.52 MIL/uL (ref 3.87–5.11)
RDW: 14.6 % (ref 11.5–15.5)
WBC: 6 10*3/uL (ref 4.0–10.5)

## 2013-12-03 LAB — CULTURE, OB URINE

## 2013-12-03 MED ORDER — OXYCODONE-ACETAMINOPHEN 5-325 MG PO TABS: 1.0000 | ORAL_TABLET | ORAL | Status: DC | PRN

## 2013-12-03 MED ORDER — IBUPROFEN 600 MG PO TABS: 600.0000 mg | ORAL_TABLET | Freq: Four times a day (QID) | ORAL | Status: DC | PRN

## 2013-12-03 MED ORDER — FENTANYL CITRATE 0.05 MG/ML IJ SOLN
100.0000 ug | INTRAMUSCULAR | Status: DC | PRN
Start: 2013-12-03 — End: 2013-12-05
  Administered 2013-12-03 – 2013-12-04 (×5): 100 ug via INTRAVENOUS
  Filled 2013-12-03 (×5): qty 2

## 2013-12-03 MED ORDER — OXYTOCIN 40 UNITS IN LACTATED RINGERS INFUSION - SIMPLE MED: 62.5000 mL/h | INTRAVENOUS | Status: DC

## 2013-12-03 MED ORDER — CITRIC ACID-SODIUM CITRATE 334-500 MG/5ML PO SOLN
30.0000 mL | ORAL | Status: DC | PRN
  Administered 2013-12-05: 30 mL via ORAL
  Filled 2013-12-03: qty 15

## 2013-12-03 MED ORDER — PENICILLIN G POTASSIUM 5000000 UNITS IJ SOLR
5.0000 10*6.[IU] | Freq: Once | INTRAVENOUS | Status: DC
  Filled 2013-12-03: qty 5

## 2013-12-03 MED ORDER — OXYTOCIN BOLUS FROM INFUSION: 500.0000 mL | INTRAVENOUS | Status: DC

## 2013-12-03 MED ORDER — LACTATED RINGERS IV SOLN
500.0000 mL | INTRAVENOUS | Status: DC | PRN
  Administered 2013-12-04 (×2): 300 mL via INTRAVENOUS

## 2013-12-03 MED ORDER — ACETAMINOPHEN 325 MG PO TABS: 650.0000 mg | ORAL_TABLET | ORAL | Status: DC | PRN

## 2013-12-03 MED ORDER — LIDOCAINE HCL (PF) 1 % IJ SOLN: 30.0000 mL | INTRAMUSCULAR | Status: DC | PRN

## 2013-12-03 MED ORDER — LACTATED RINGERS IV SOLN
INTRAVENOUS | Status: DC
  Administered 2013-12-03 – 2013-12-05 (×8): via INTRAVENOUS

## 2013-12-03 MED ORDER — PENICILLIN G POTASSIUM 5000000 UNITS IJ SOLR
2.5000 10*6.[IU] | INTRAMUSCULAR | Status: DC
  Filled 2013-12-03: qty 2.5

## 2013-12-03 MED ORDER — ONDANSETRON HCL 4 MG/2ML IJ SOLN: 4.0000 mg | Freq: Four times a day (QID) | INTRAMUSCULAR | Status: DC | PRN

## 2013-12-03 NOTE — Progress Notes (Signed)
Dr. Nevada Crane into see pt prior to IOL & discuss POC.

## 2013-12-03 NOTE — H&P (Addendum)
Anne Murillo is a 28 y.o. female presenting for IOL for chronic HTN. +FM, infrequent contractions, no VB, no LOF.  She has been followed at [facility] for chronic HTN. No evidence of IUGR or pre-eclampsia. EDD 3.22.15 by LMP and U/S. Occasional PVCs have been noted in the past but no medications, PVCs not consistently noted. Trichomonas in August.   History OB History   Grav Para Term Preterm Abortions TAB SAB Ect Mult Living   1         0     Past Medical History  Diagnosis Date  . Medical history non-contributory    Past Surgical History  Procedure Laterality Date  . No past surgeries     Family History: family history is not on file. Social History:  reports that she has never smoked. She has never used smokeless tobacco. She reports that she does not drink alcohol or use illicit drugs.   Prenatal Transfer Tool  Maternal Diabetes: No Genetic Screening: Normal Maternal Ultrasounds/Referrals: Normal Fetal Ultrasounds or other Referrals:  None Maternal Substance Abuse:  No Significant Maternal Medications:  None Significant Maternal Lab Results:  Lab values include: Group B Strep positive Other Comments:  Glucola = 116  ROS See HPI    Blood pressure 138/84, pulse 96, temperature 98.8 F (37.1 C), temperature source Oral, resp. rate 20, height 5' 2.99" (1.6 m), weight 85.73 kg (189 lb), last menstrual period 02/26/2013. Maternal Exam:  Uterine Assessment: Contraction strength is moderate.  Contraction frequency is regular.   Cervix: Cervix evaluated by digital exam.     Fetal Exam Fetal Monitor Review: Mode: ultrasound.   Baseline rate: 140.  Variability: moderate (6-25 bpm).   Pattern: accelerations present and no decelerations.    Fetal State Assessment: Category I - tracings are normal.     Physical Exam  Constitutional: She is oriented to person, place, and time. She appears well-developed and well-nourished. No distress.  HENT:  Head: Normocephalic and  atraumatic.  Cardiovascular: Normal rate and regular rhythm.   Respiratory: Effort normal and breath sounds normal. No respiratory distress. She has no wheezes.  GI: She exhibits no distension. There is no tenderness. There is no rebound and no guarding.  Neurological: She is alert and oriented to person, place, and time. She has normal reflexes. No cranial nerve deficit. Coordination normal.  Skin: Skin is warm and dry. No erythema.  Psychiatric: She has a normal mood and affect. Her behavior is normal.    Prenatal labs: ABO, Rh: O/POS/-- (08/11 1547) Antibody: NEG (08/11 1547) Rubella: 9.01 (08/11 1547) RPR: NON REAC (12/30 1536)  HBsAg: NEGATIVE (08/11 1547)  HIV: NON REACTIVE (08/11 1547)  GBS: Positive (02/24 0000)   Assessment/Plan: Anne Murillo is a 28 y.o. G1P0 @ [redacted]w[redacted]d presents for IOL for chronic HTN.   #Labor: Induce with foley bulb placement, recheck cervix after bulb dislodges; EFW of 85%ile at 35th week. Active management, expect SVD. #Pain: Fentanyl #FWB: Category I; reassuring #ID:  GBS +; penicillin #Feeding: breast #MOC: Condoms # HTN:  Borderline levels, not hypertensive at present # Husband is interpreting; she has signed a consent and does not want a medical interpreter.     Jacques Earthly 12/03/2013, 8:31 PM  I examined pt and agree with documentation above and resident plan of care. [place], CNM

## 2013-12-04 ENCOUNTER — Inpatient Hospital Stay (HOSPITAL_COMMUNITY): Payer: Medicaid Other | Admitting: Anesthesiology

## 2013-12-04 ENCOUNTER — Telehealth: Payer: Self-pay

## 2013-12-04 ENCOUNTER — Encounter (HOSPITAL_COMMUNITY): Payer: Self-pay

## 2013-12-04 ENCOUNTER — Encounter (HOSPITAL_COMMUNITY): Payer: Medicaid Other | Admitting: Anesthesiology

## 2013-12-04 DIAGNOSIS — A491 Streptococcal infection, unspecified site: Secondary | ICD-10-CM

## 2013-12-04 LAB — CBC
HEMATOCRIT: 36.5 % (ref 36.0–46.0)
Hemoglobin: 12 g/dL (ref 12.0–15.0)
MCH: 27.1 pg (ref 26.0–34.0)
MCHC: 32.9 g/dL (ref 30.0–36.0)
MCV: 82.6 fL (ref 78.0–100.0)
Platelets: 253 10*3/uL (ref 150–400)
RBC: 4.42 MIL/uL (ref 3.87–5.11)
RDW: 14.8 % (ref 11.5–15.5)
WBC: 10.3 10*3/uL (ref 4.0–10.5)

## 2013-12-04 LAB — PROTEIN / CREATININE RATIO, URINE
CREATININE, URINE: 38.14 mg/dL
Protein Creatinine Ratio: 0.25 — ABNORMAL HIGH (ref 0.00–0.15)
Total Protein, Urine: 9.6 mg/dL

## 2013-12-04 LAB — COMPREHENSIVE METABOLIC PANEL
ALT: 10 U/L (ref 0–35)
AST: 16 U/L (ref 0–37)
Albumin: 2.7 g/dL — ABNORMAL LOW (ref 3.5–5.2)
Alkaline Phosphatase: 566 U/L — ABNORMAL HIGH (ref 39–117)
BILIRUBIN TOTAL: 0.9 mg/dL (ref 0.3–1.2)
BUN: 5 mg/dL — ABNORMAL LOW (ref 6–23)
CHLORIDE: 100 meq/L (ref 96–112)
CO2: 20 meq/L (ref 19–32)
CREATININE: 0.68 mg/dL (ref 0.50–1.10)
Calcium: 9.6 mg/dL (ref 8.4–10.5)
GFR calc Af Amer: >90 mL/min (ref >90)
GLUCOSE: 81 mg/dL (ref 70–99)
Potassium: 4.5 mEq/L (ref 3.7–5.3)
Sodium: 134 mEq/L — ABNORMAL LOW (ref 137–147)
Total Protein: 6.4 g/dL (ref 6.0–8.3)

## 2013-12-04 LAB — RPR: RPR Ser Ql: NONREACTIVE

## 2013-12-04 MED ORDER — TERBUTALINE SULFATE 1 MG/ML IJ SOLN: 0.2500 mg | Freq: Once | INTRAMUSCULAR | Status: AC | PRN

## 2013-12-04 MED ORDER — LACTATED RINGERS IV SOLN
500.0000 mL | Freq: Once | INTRAVENOUS | Status: AC
  Administered 2013-12-04: 500 mL via INTRAVENOUS

## 2013-12-04 MED ORDER — FENTANYL 2.5 MCG/ML BUPIVACAINE 1/10 % EPIDURAL INFUSION (WH - ANES)
INTRAMUSCULAR | Status: DC | PRN
  Administered 2013-12-04: 14 mL/h via EPIDURAL

## 2013-12-04 MED ORDER — FENTANYL 2.5 MCG/ML BUPIVACAINE 1/10 % EPIDURAL INFUSION (WH - ANES)
14.0000 mL/h | INTRAMUSCULAR | Status: DC | PRN
  Administered 2013-12-04: 14 mL/h via EPIDURAL
  Filled 2013-12-04 (×2): qty 125

## 2013-12-04 MED ORDER — EPHEDRINE 5 MG/ML INJ: 10.0000 mg | INTRAVENOUS | Status: DC | PRN

## 2013-12-04 MED ORDER — AMOXICILLIN 500 MG PO CAPS: 500.0000 mg | ORAL_CAPSULE | Freq: Three times a day (TID) | ORAL | Status: DC

## 2013-12-04 MED ORDER — PENICILLIN G POTASSIUM 5000000 UNITS IJ SOLR
2.5000 10*6.[IU] | INTRAVENOUS | Status: DC
  Administered 2013-12-04 (×3): 2.5 10*6.[IU] via INTRAVENOUS
  Filled 2013-12-04 (×6): qty 2.5

## 2013-12-04 MED ORDER — EPHEDRINE 5 MG/ML INJ
10.0000 mg | INTRAVENOUS | Status: DC | PRN
  Filled 2013-12-04: qty 4

## 2013-12-04 MED ORDER — OXYTOCIN 40 UNITS IN LACTATED RINGERS INFUSION - SIMPLE MED
62.5000 mL/h | INTRAVENOUS | Status: DC
  Filled 2013-12-04: qty 1000

## 2013-12-04 MED ORDER — OXYTOCIN 40 UNITS IN LACTATED RINGERS INFUSION - SIMPLE MED
1.0000 m[IU]/min | INTRAVENOUS | Status: DC
Start: 2013-12-04 — End: 2013-12-05

## 2013-12-04 MED ORDER — PENICILLIN G POTASSIUM 5000000 UNITS IJ SOLR
5.0000 10*6.[IU] | Freq: Once | INTRAVENOUS | Status: AC
  Administered 2013-12-04: 5 10*6.[IU] via INTRAVENOUS
  Filled 2013-12-04: qty 5

## 2013-12-04 MED ORDER — LIDOCAINE HCL (PF) 1 % IJ SOLN
INTRAMUSCULAR | Status: DC | PRN
  Administered 2013-12-04 (×2): 8 mL

## 2013-12-04 MED ORDER — DIPHENHYDRAMINE HCL 50 MG/ML IJ SOLN: 12.5000 mg | INTRAMUSCULAR | Status: DC | PRN

## 2013-12-04 MED ORDER — PHENYLEPHRINE 40 MCG/ML (10ML) SYRINGE FOR IV PUSH (FOR BLOOD PRESSURE SUPPORT): 80.0000 ug | PREFILLED_SYRINGE | INTRAVENOUS | Status: DC | PRN

## 2013-12-04 MED ORDER — PHENYLEPHRINE 40 MCG/ML (10ML) SYRINGE FOR IV PUSH (FOR BLOOD PRESSURE SUPPORT)
80.0000 ug | PREFILLED_SYRINGE | INTRAVENOUS | Status: DC | PRN
  Filled 2013-12-04: qty 10

## 2013-12-04 NOTE — Anesthesia Preprocedure Evaluation (Addendum)
Anesthesia Evaluation  Patient identified by MRN, date of birth, ID band Patient awake    Reviewed: Allergy & Precautions, H&P , NPO status , Patient's Chart, lab work & pertinent test results  Airway Mallampati: II TM Distance: >3 FB Neck ROM: full    Dental no notable dental hx.    Pulmonary neg pulmonary ROS,    Pulmonary exam normal       Cardiovascular hypertension (CHTN),     Neuro/Psych negative neurological ROS  negative psych ROS   GI/Hepatic negative GI ROS, Neg liver ROS,   Endo/Other  negative endocrine ROS  Renal/GU negative Renal ROS     Musculoskeletal negative musculoskeletal ROS (+)   Abdominal Normal abdominal exam  (+)   Peds  Hematology negative hematology ROS (+)   Anesthesia Other Findings   Reproductive/Obstetrics (+) Pregnancy (fetal intolerance to labor --> C/S)                         Anesthesia Physical Anesthesia Plan  ASA: II and emergent  Anesthesia Plan: Epidural   Post-op Pain Management:    Induction:   Airway Management Planned:   Additional Equipment:   Intra-op Plan:   Post-operative Plan:   Informed Consent: I have reviewed the patients History and Physical, chart, labs and discussed the procedure including the risks, benefits and alternatives for the proposed anesthesia with the patient or authorized representative who has indicated his/her understanding and acceptance.     Plan Discussed with: Surgeon and CRNA  Anesthesia Plan Comments:        Anesthesia Quick Evaluation

## 2013-12-04 NOTE — Progress Notes (Signed)
Anne Murillo is a 28 y.o. G1P0 at [redacted]w[redacted]d by ultrasound admitted for induction of labor due to Hypertension, chronic.  Subjective: Some discomfort, regular contractions  Objective: BP 132/78  Pulse 74  Temp(Src) 98.2 F (36.8 C) (Oral)  Resp 20  Ht 5' 2.99" (1.6 m)  Wt 85.73 kg (189 lb)  BMI 33.49 kg/m2  LMP 02/26/2013      FHT:  FHR: 135 bpm, variability: moderate,  accelerations:  Present,  decelerations:  Absent UC:   regular, every 3 minutes SVE:   Dilation: 1 Effacement (%): 60 Station: -2;-3 Exam by:: Truitt Leep, RNC  Labs: Lab Results  Component Value Date   WBC 6.0 12/03/2013   HGB 12.5 12/03/2013   HCT 37.1 12/03/2013   MCV 82.1 12/03/2013   PLT 263 12/03/2013    Assessment / Plan: Augmentation of labor, progressing well  Labor: FB not ready for removal Preeclampsia:  no signs or symptoms of toxicity Fetal Wellbeing:  Category I Pain Control:  Fentanyl I/D:  GBS+; PCN Anticipated MOD:  NSVD  Jacques Earthly 12/04/2013, 4:04 AM

## 2013-12-04 NOTE — Progress Notes (Signed)
[place] Interpreter [phone] on the phone to discuss pain relief options with patient and spouse. Questions answered about IV pain medication vs. Epidural. Patient verbalizes understanding.

## 2013-12-04 NOTE — Progress Notes (Signed)
Patient asked if she was able to eat anything this morning. RN explained that she is on a clear liquid diet and notified patient of what she would be able to order. RN walked into room shortly after to find husband about to feed patient a thick, milk based soup. RN reiterated what a clear liquid diet is. Patient and husband verbalized understanding.

## 2013-12-04 NOTE — H&P (Signed)
I examined pt and agree with documentation above and resident plan of care. Anne Murillo,Anne Murillo  

## 2013-12-04 NOTE — Telephone Encounter (Signed)
Prescription prescribed. Per chart review pt. Is admitted for induction of labor. Prescription discontinued as pt. Treated in-patient.

## 2013-12-04 NOTE — Progress Notes (Signed)
Anne Murillo is a 28 y.o. G1P0 at [redacted]w[redacted]d admitted for induction of labor due to chronic HTN.  Subjective:  Pt doing well. Resting with epidural.  +FM.    Objective: BP 159/80  Pulse 74  Temp(Src) 98.1 F (36.7 C) (Oral)  Resp 18  Ht 5' 2.99" (1.6 m)  Wt 85.73 kg (189 lb)  BMI 33.49 kg/m2  SpO2 98%  LMP 02/26/2013 I/O last 3 completed shifts: In: -  Out: 850 [Urine:850]    FHT:  FHR: 150 bpm, variability: minimal with periods of moderate,  accelerations:  Present,  decelerations:  Present occasional variables and occasional lates. Good scalp stim UC:   irregular, every 2-5 minutes  SVE:   Dilation: 5 Effacement (%): 70 Station: -2 Exam by:: L.Stubbs, Rn  Labs: Lab Results  Component Value Date   WBC 10.3 12/04/2013   HGB 12.0 12/04/2013   HCT 36.5 12/04/2013   MCV 82.6 12/04/2013   PLT 253 12/04/2013    Assessment / Plan: IOL for cHTN.   Labor: AROM performed with return of clear fluid.  IUPC placed without difficulty.  cHTN:  first elevated BP now. will send [place] labs Fetal Wellbeing:  Category II Pain Control:  Epidural I/D:  gbs pos and on PCN Anticipated MOD:  NSVD  Kamaal Cast L 12/04/2013, 9:50 PM

## 2013-12-04 NOTE — Progress Notes (Signed)
Anne Murillo is a 28 y.o. G1P0 at [redacted]w[redacted]d admitted for induction of labor due to [place].  Subjective:  Doing well. S/p epidural. Resting.   Objective: BP 124/75  Pulse 97  Temp(Src) 98.1 F (36.7 C) (Oral)  Resp 20  Ht 5' 2.99" (1.6 m)  Wt 85.73 kg (189 lb)  BMI 33.49 kg/m2  SpO2 98%  LMP 02/26/2013      FHT:  FHR: 140 bpm, variability: moderate,  accelerations:  Present,  decelerations:  Present occasional variables UC:   regular, every 2-4 minutes  SVE:   Dilation: 4 Effacement (%): 70 Station: Ballotable;-3 Exam by:: Franchot Erichsen, RN  Labs: Lab Results  Component Value Date   WBC 10.3 12/04/2013   HGB 12.0 12/04/2013   HCT 36.5 12/04/2013   MCV 82.6 12/04/2013   PLT 253 12/04/2013    Assessment / Plan: IOL for cHTN  Labor: progressing on pitocin. still in early labor with a foley bulb cervix Fetal Wellbeing:  Category I Pain Control:  Epidural I/D:  positive and on PCN Anticipated MOD:  NSVD  Sanya Kobrin L 12/04/2013, 4:04 PM

## 2013-12-04 NOTE — Progress Notes (Signed)
[place] [phone] Leeroy Bock) on the phone to explain the purpose of pitocin and penicillin to the patient. Patient verbalizes understanding and has no questions.

## 2013-12-04 NOTE — Telephone Encounter (Signed)
Message copied by Geanie Logan on Mon Dec 04, 2013 11:32 AM ------      Message from: Donnamae Jude      Created: Mon Dec 04, 2013  9:08 AM       Needs Amox 500 mg tid x 7 d # 21 no RF ------

## 2013-12-04 NOTE — Progress Notes (Signed)
Anne Murillo is a 28 y.o. G1P0 at [redacted]w[redacted]d by ultrasound admitted for induction of labor due to Hypertension, chronic.  Subjective: Some discomfort, regular contractions   Objective: BP 132/77  Pulse 69  Temp(Src) 99.1 F (37.3 C) (Oral)  Resp 18  Ht 5' 2.99" (1.6 m)  Wt 85.73 kg (189 lb)  BMI 33.49 kg/m2  LMP 02/26/2013      FHT:  FHR: 135 bpm, variability: moderate,  accelerations:  Present,  decelerations:  Absent UC:   regular, every 3-4 minutes SVE:   Dilation: 4.5 Effacement (%): 50 Station: -3;-2 Exam by:: [place] Crane, MD  Labs: Lab Results  Component Value Date   WBC 6.0 12/03/2013   HGB 12.5 12/03/2013   HCT 37.1 12/03/2013   MCV 82.1 12/03/2013   PLT 263 12/03/2013    Assessment / Plan: Augmentation of labor, progressing well  Labor: s/p FB. still progressing. will start pitocin.  Preeclampsia:  no signs or symptoms of toxicity Fetal Wellbeing:  Category I Pain Control:  Fentanyl I/D:  GBS+; PCN Anticipated MOD:  NSVD  Jacques Earthly 12/04/2013, 7:42 AM

## 2013-12-04 NOTE — Progress Notes (Signed)
Anne Murillo is a 29 y.o. G1P0 at [redacted]w[redacted]d admitted for induction of labor due to chronic HTN.  Subjective:  Pt doing well. Resting with epidural.  +FM.    Objective: BP 130/59  Pulse 71  Temp(Src) 99.1 F (37.3 C) (Oral)  Resp 16  Ht 5' 2.99" (1.6 m)  Wt 189 lb (85.73 kg)  BMI 33.49 kg/m2  SpO2 98%  LMP 02/26/2013 I/O last 3 completed shifts: In: -  Out: 850 [Urine:850] Total I/O In: -  Out: 425 [Urine:425]  FHT:  FHR: 150 bpm, variability: minimal with periods of moderate,  accelerations:  Present,  decelerations:  Present occasional variables and occasional lates. Good scalp stim UC:   irregular, every 2-5 minutes  SVE:   Dilation: 5 Effacement (%): 70 Station: 0;-1 Exam by:: Dr.Anyaw + Molding and caput  Labs: Lab Results  Component Value Date   WBC 10.3 12/04/2013   HGB 12.0 12/04/2013   HCT 36.5 12/04/2013   MCV 82.6 12/04/2013   PLT 253 12/04/2013    Assessment / Plan: IOL for cHTN at [redacted]w[redacted]d. Occasional FHR late decelerations concerning. Had a period of moderate variability with one small acceleration prior to examination. Pitocin currently at 24. Continue to observe closely.  If tracing continues to be concerning and patient is still remote from vaginal delivery; will proceed with cesarean delivery. This was discussed with patient and her husband.   Osborne Oman, MD 12/04/2013, 11:35 PM

## 2013-12-04 NOTE — Anesthesia Procedure Notes (Signed)
Epidural Patient location during procedure: OB Start time: 12/04/2013 2:44 PM End time: 12/04/2013 2:48 PM  Staffing Anesthesiologist: Lyn Hollingshead Performed by: anesthesiologist   Preanesthetic Checklist Completed: patient identified, surgical consent, pre-op evaluation, timeout performed, IV checked, risks and benefits discussed and monitors and equipment checked  Epidural Patient position: sitting Prep: site prepped and draped and DuraPrep Patient monitoring: continuous pulse ox and blood pressure Approach: midline Location: L3-L4 Injection technique: LOR air  Needle:  Needle type: Tuohy  Needle gauge: 17 G Needle length: 9 cm and 9 Needle insertion depth: 5 cm cm Catheter type: closed end flexible Catheter size: 19 Gauge Catheter at skin depth: 10 cm Test dose: negative and Other  Assessment Sensory level: T9 Events: blood not aspirated, injection not painful, no injection resistance, negative IV test and no paresthesia  Additional Notes Reason for block:procedure for pain

## 2013-12-05 ENCOUNTER — Encounter (HOSPITAL_COMMUNITY): Payer: Self-pay

## 2013-12-05 ENCOUNTER — Encounter (HOSPITAL_COMMUNITY)
Admission: RE | Disposition: A | Discharge status: Home / Self Care | Payer: Self-pay | Source: Ambulatory Visit | Attending: Obstetrics & Gynecology

## 2013-12-05 DIAGNOSIS — O99892 Other specified diseases and conditions complicating childbirth: Secondary | ICD-10-CM

## 2013-12-05 DIAGNOSIS — Z98891 History of uterine scar from previous surgery: Secondary | ICD-10-CM

## 2013-12-05 DIAGNOSIS — O1002 Pre-existing essential hypertension complicating childbirth: Secondary | ICD-10-CM

## 2013-12-05 DIAGNOSIS — O9989 Other specified diseases and conditions complicating pregnancy, childbirth and the puerperium: Secondary | ICD-10-CM

## 2013-12-05 LAB — CBC
HCT: 28.8 % — ABNORMAL LOW (ref 36.0–46.0)
HCT: 26.6 % — ABNORMAL LOW (ref 36.0–46.0)
Hemoglobin: 9.5 g/dL — ABNORMAL LOW (ref 12.0–15.0)
Hemoglobin: 8.7 g/dL — ABNORMAL LOW (ref 12.0–15.0)
MCH: 27.4 pg (ref 26.0–34.0)
MCH: 26.9 pg (ref 26.0–34.0)
MCHC: 33.3 g/dL (ref 30.0–36.0)
MCHC: 32.7 g/dL (ref 30.0–36.0)
MCV: 82.3 fL (ref 78.0–100.0)
MCV: 82.1 fL (ref 78.0–100.0)
PLATELETS: 165 10*3/uL (ref 150–400)
Platelets: 199 K/uL (ref 150–400)
RBC: 3.5 MIL/uL — ABNORMAL LOW (ref 3.87–5.11)
RBC: 3.24 MIL/uL — AB (ref 3.87–5.11)
RDW: 14.8 % (ref 11.5–15.5)
RDW: 14.7 % (ref 11.5–15.5)
WBC: 14.5 K/uL — ABNORMAL HIGH (ref 4.0–10.5)
WBC: 14.8 10*3/uL — ABNORMAL HIGH (ref 4.0–10.5)

## 2013-12-05 SURGERY — Surgical Case
Anesthesia: Epidural | Site: Abdomen

## 2013-12-05 MED ORDER — IBUPROFEN 600 MG PO TABS
600.0000 mg | ORAL_TABLET | Freq: Four times a day (QID) | ORAL | Status: DC
  Administered 2013-12-05 – 2013-12-07 (×9): 600 mg via ORAL
  Filled 2013-12-05 (×9): qty 1

## 2013-12-05 MED ORDER — DIPHENHYDRAMINE HCL 50 MG/ML IJ SOLN: 12.5000 mg | INTRAMUSCULAR | Status: DC | PRN

## 2013-12-05 MED ORDER — DIPHENHYDRAMINE HCL 25 MG PO CAPS
25.0000 mg | ORAL_CAPSULE | ORAL | Status: DC | PRN
  Administered 2013-12-05 – 2013-12-06 (×3): 25 mg via ORAL
  Filled 2013-12-05 (×2): qty 1

## 2013-12-05 MED ORDER — DIPHENHYDRAMINE HCL 50 MG/ML IJ SOLN: 25.0000 mg | INTRAMUSCULAR | Status: DC | PRN

## 2013-12-05 MED ORDER — PHENYLEPHRINE 40 MCG/ML (10ML) SYRINGE FOR IV PUSH (FOR BLOOD PRESSURE SUPPORT)
PREFILLED_SYRINGE | INTRAVENOUS | Status: AC
  Filled 2013-12-05: qty 5

## 2013-12-05 MED ORDER — DOCUSATE SODIUM 100 MG PO CAPS
100.0000 mg | ORAL_CAPSULE | Freq: Two times a day (BID) | ORAL | Status: DC | PRN
  Filled 2013-12-05: qty 1

## 2013-12-05 MED ORDER — LANOLIN HYDROUS EX OINT: 1.0000 "application " | TOPICAL_OINTMENT | CUTANEOUS | Status: DC | PRN

## 2013-12-05 MED ORDER — METOCLOPRAMIDE HCL 5 MG/ML IJ SOLN: 10.0000 mg | Freq: Three times a day (TID) | INTRAMUSCULAR | Status: DC | PRN

## 2013-12-05 MED ORDER — KETOROLAC TROMETHAMINE 60 MG/2ML IM SOLN
60.0000 mg | Freq: Once | INTRAMUSCULAR | Status: AC | PRN
  Filled 2013-12-05: qty 2

## 2013-12-05 MED ORDER — ONDANSETRON HCL 4 MG/2ML IJ SOLN: 4.0000 mg | INTRAMUSCULAR | Status: DC | PRN

## 2013-12-05 MED ORDER — SENNOSIDES-DOCUSATE SODIUM 8.6-50 MG PO TABS
2.0000 | ORAL_TABLET | ORAL | Status: DC
  Administered 2013-12-06 (×2): 2 via ORAL
  Filled 2013-12-05 (×2): qty 2

## 2013-12-05 MED ORDER — TETANUS-DIPHTH-ACELL PERTUSSIS 5-2.5-18.5 LF-MCG/0.5 IM SUSP: 0.5000 mL | Freq: Once | INTRAMUSCULAR | Status: DC

## 2013-12-05 MED ORDER — 0.9 % SODIUM CHLORIDE (POUR BTL) OPTIME
TOPICAL | Status: DC | PRN
  Administered 2013-12-05: 1000 mL

## 2013-12-05 MED ORDER — KETOROLAC TROMETHAMINE 30 MG/ML IJ SOLN
30.0000 mg | Freq: Four times a day (QID) | INTRAMUSCULAR | Status: AC | PRN
  Administered 2013-12-05: 30 mg via INTRAVENOUS

## 2013-12-05 MED ORDER — ONDANSETRON HCL 4 MG/2ML IJ SOLN: 4.0000 mg | Freq: Three times a day (TID) | INTRAMUSCULAR | Status: DC | PRN

## 2013-12-05 MED ORDER — PRENATAL MULTIVITAMIN CH
1.0000 | ORAL_TABLET | Freq: Every day | ORAL | Status: DC
  Administered 2013-12-05 – 2013-12-07 (×3): 1 via ORAL
  Filled 2013-12-05 (×3): qty 1

## 2013-12-05 MED ORDER — FERROUS SULFATE 325 (65 FE) MG PO TABS
325.0000 mg | ORAL_TABLET | Freq: Three times a day (TID) | ORAL | Status: DC
  Administered 2013-12-05 – 2013-12-07 (×8): 325 mg via ORAL
  Filled 2013-12-05 (×8): qty 1

## 2013-12-05 MED ORDER — LACTATED RINGERS IV SOLN
INTRAVENOUS | Status: DC | PRN
  Administered 2013-12-05 (×2): via INTRAVENOUS

## 2013-12-05 MED ORDER — ONDANSETRON HCL 4 MG PO TABS: 4.0000 mg | ORAL_TABLET | ORAL | Status: DC | PRN

## 2013-12-05 MED ORDER — SODIUM BICARBONATE 8.4 % IV SOLN
INTRAVENOUS | Status: AC
  Filled 2013-12-05: qty 50

## 2013-12-05 MED ORDER — MEPERIDINE HCL 25 MG/ML IJ SOLN
INTRAMUSCULAR | Status: AC
  Filled 2013-12-05: qty 1

## 2013-12-05 MED ORDER — ONDANSETRON HCL 4 MG/2ML IJ SOLN
INTRAMUSCULAR | Status: AC
  Filled 2013-12-05: qty 2

## 2013-12-05 MED ORDER — KETOROLAC TROMETHAMINE 30 MG/ML IJ SOLN
INTRAMUSCULAR | Status: AC
  Administered 2013-12-05: 30 mg via INTRAVENOUS
  Filled 2013-12-05: qty 1

## 2013-12-05 MED ORDER — KETOROLAC TROMETHAMINE 30 MG/ML IJ SOLN: 30.0000 mg | Freq: Four times a day (QID) | INTRAMUSCULAR | Status: AC | PRN

## 2013-12-05 MED ORDER — MORPHINE SULFATE 0.5 MG/ML IJ SOLN
INTRAMUSCULAR | Status: AC
  Filled 2013-12-05: qty 10

## 2013-12-05 MED ORDER — OXYTOCIN 10 UNIT/ML IJ SOLN
40.0000 [IU] | INTRAMUSCULAR | Status: DC | PRN
  Administered 2013-12-05: 40 [IU] via INTRAVENOUS

## 2013-12-05 MED ORDER — SCOPOLAMINE 1 MG/3DAYS TD PT72
1.0000 | MEDICATED_PATCH | Freq: Once | TRANSDERMAL | Status: DC
  Administered 2013-12-05: 1.5 mg via TRANSDERMAL

## 2013-12-05 MED ORDER — CEFAZOLIN SODIUM-DEXTROSE 2-3 GM-% IV SOLR
INTRAVENOUS | Status: AC
  Filled 2013-12-05: qty 50

## 2013-12-05 MED ORDER — MEPERIDINE HCL 25 MG/ML IJ SOLN
INTRAMUSCULAR | Status: DC | PRN
  Administered 2013-12-05: 12.5 mg via INTRAVENOUS

## 2013-12-05 MED ORDER — METOCLOPRAMIDE HCL 5 MG/ML IJ SOLN: 10.0000 mg | Freq: Once | INTRAMUSCULAR | Status: DC | PRN

## 2013-12-05 MED ORDER — SODIUM BICARBONATE 8.4 % IV SOLN
INTRAVENOUS | Status: DC | PRN
  Administered 2013-12-05 (×2): 5 mL via EPIDURAL
  Administered 2013-12-05: 3 mL via EPIDURAL

## 2013-12-05 MED ORDER — WITCH HAZEL-GLYCERIN EX PADS: 1.0000 "application " | MEDICATED_PAD | CUTANEOUS | Status: DC | PRN

## 2013-12-05 MED ORDER — NALOXONE HCL 0.4 MG/ML IJ SOLN: 0.4000 mg | INTRAMUSCULAR | Status: DC | PRN

## 2013-12-05 MED ORDER — OXYCODONE-ACETAMINOPHEN 5-325 MG PO TABS
1.0000 | ORAL_TABLET | ORAL | Status: DC | PRN
  Administered 2013-12-05 – 2013-12-07 (×4): 1 via ORAL
  Filled 2013-12-05 (×4): qty 1

## 2013-12-05 MED ORDER — SIMETHICONE 80 MG PO CHEW
80.0000 mg | CHEWABLE_TABLET | ORAL | Status: DC | PRN
  Administered 2013-12-05: 80 mg via ORAL
  Filled 2013-12-05: qty 1

## 2013-12-05 MED ORDER — BUPIVACAINE HCL (PF) 0.5 % IJ SOLN
INTRAMUSCULAR | Status: DC | PRN
  Administered 2013-12-05: 30 mL

## 2013-12-05 MED ORDER — MORPHINE SULFATE (PF) 0.5 MG/ML IJ SOLN
INTRAMUSCULAR | Status: DC | PRN
  Administered 2013-12-05: 3 mg via EPIDURAL

## 2013-12-05 MED ORDER — OXYTOCIN 10 UNIT/ML IJ SOLN
INTRAMUSCULAR | Status: AC
  Filled 2013-12-05: qty 4

## 2013-12-05 MED ORDER — DIBUCAINE 1 % RE OINT: 1.0000 "application " | TOPICAL_OINTMENT | RECTAL | Status: DC | PRN

## 2013-12-05 MED ORDER — NALBUPHINE HCL 10 MG/ML IJ SOLN
5.0000 mg | INTRAMUSCULAR | Status: DC | PRN
  Administered 2013-12-05: 10 mg via INTRAVENOUS
  Administered 2013-12-05: 5 mg via INTRAVENOUS
  Filled 2013-12-05 (×3): qty 1

## 2013-12-05 MED ORDER — SCOPOLAMINE 1 MG/3DAYS TD PT72
MEDICATED_PATCH | TRANSDERMAL | Status: AC
  Administered 2013-12-05: 1.5 mg via TRANSDERMAL
  Filled 2013-12-05: qty 1

## 2013-12-05 MED ORDER — MEASLES, MUMPS & RUBELLA VAC ~~LOC~~ INJ
0.5000 mL | INJECTION | Freq: Once | SUBCUTANEOUS | Status: DC
  Filled 2013-12-05: qty 0.5

## 2013-12-05 MED ORDER — LIDOCAINE-EPINEPHRINE (PF) 2 %-1:200000 IJ SOLN
INTRAMUSCULAR | Status: AC
  Filled 2013-12-05: qty 20

## 2013-12-05 MED ORDER — MAGNESIUM HYDROXIDE 400 MG/5ML PO SUSP: 30.0000 mL | ORAL | Status: DC | PRN

## 2013-12-05 MED ORDER — OXYTOCIN 40 UNITS IN LACTATED RINGERS INFUSION - SIMPLE MED: 62.5000 mL/h | INTRAVENOUS | Status: AC

## 2013-12-05 MED ORDER — NALOXONE HCL 1 MG/ML IJ SOLN
1.0000 ug/kg/h | INTRAVENOUS | Status: DC | PRN
  Filled 2013-12-05: qty 2

## 2013-12-05 MED ORDER — FENTANYL CITRATE 0.05 MG/ML IJ SOLN
INTRAMUSCULAR | Status: AC
  Administered 2013-12-05: 50 ug via INTRAVENOUS
  Filled 2013-12-05: qty 2

## 2013-12-05 MED ORDER — SIMETHICONE 80 MG PO CHEW
80.0000 mg | CHEWABLE_TABLET | ORAL | Status: DC
  Administered 2013-12-06 (×2): 80 mg via ORAL
  Filled 2013-12-05 (×2): qty 1

## 2013-12-05 MED ORDER — DIPHENHYDRAMINE HCL 25 MG PO CAPS
25.0000 mg | ORAL_CAPSULE | Freq: Four times a day (QID) | ORAL | Status: DC | PRN
  Filled 2013-12-05: qty 1

## 2013-12-05 MED ORDER — MEPERIDINE HCL 25 MG/ML IJ SOLN: 6.2500 mg | INTRAMUSCULAR | Status: DC | PRN

## 2013-12-05 MED ORDER — CEFAZOLIN SODIUM-DEXTROSE 2-3 GM-% IV SOLR
2.0000 g | Freq: Once | INTRAVENOUS | Status: AC
  Administered 2013-12-05: 2 g via INTRAVENOUS
  Filled 2013-12-05: qty 50

## 2013-12-05 MED ORDER — MENTHOL 3 MG MT LOZG: 1.0000 | LOZENGE | OROMUCOSAL | Status: DC | PRN

## 2013-12-05 MED ORDER — NALBUPHINE HCL 10 MG/ML IJ SOLN: 5.0000 mg | INTRAMUSCULAR | Status: DC | PRN

## 2013-12-05 MED ORDER — ZOLPIDEM TARTRATE 5 MG PO TABS: 5.0000 mg | ORAL_TABLET | Freq: Every evening | ORAL | Status: DC | PRN

## 2013-12-05 MED ORDER — FENTANYL CITRATE 0.05 MG/ML IJ SOLN
25.0000 ug | INTRAMUSCULAR | Status: DC | PRN
  Administered 2013-12-05 (×2): 50 ug via INTRAVENOUS

## 2013-12-05 MED ORDER — SODIUM CHLORIDE 0.9 % IJ SOLN: 3.0000 mL | INTRAMUSCULAR | Status: DC | PRN

## 2013-12-05 MED ORDER — BUPIVACAINE HCL (PF) 0.5 % IJ SOLN
INTRAMUSCULAR | Status: AC
  Filled 2013-12-05: qty 30

## 2013-12-05 MED ORDER — ONDANSETRON HCL 4 MG/2ML IJ SOLN
INTRAMUSCULAR | Status: DC | PRN
  Administered 2013-12-05: 4 mg via INTRAVENOUS

## 2013-12-05 SURGICAL SUPPLY — 38 items
BENZOIN TINCTURE PRP APPL 2/3 (GAUZE/BANDAGES/DRESSINGS) ×3 IMPLANT
CLAMP CORD UMBIL (MISCELLANEOUS) ×3 IMPLANT
CLOSURE WOUND 1/2 X4 (GAUZE/BANDAGES/DRESSINGS) ×1
CLOTH BEACON ORANGE TIMEOUT ST (SAFETY) ×3 IMPLANT
DRAPE LG THREE QUARTER DISP (DRAPES) ×3 IMPLANT
DRSG OPSITE POSTOP 4X10 (GAUZE/BANDAGES/DRESSINGS) ×3 IMPLANT
DURAPREP 26ML APPLICATOR (WOUND CARE) ×3 IMPLANT
ELECT REM PT RETURN 9FT ADLT (ELECTROSURGICAL) ×3
ELECTRODE REM PT RTRN 9FT ADLT (ELECTROSURGICAL) ×1 IMPLANT
EXTRACTOR VACUUM M CUP 4 TUBE (SUCTIONS) IMPLANT
EXTRACTOR VACUUM M CUP 4' TUBE (SUCTIONS)
GLOVE BIO SURGEON STRL SZ7 (GLOVE) ×3 IMPLANT
GLOVE BIOGEL PI IND STRL 7.0 (GLOVE) ×1 IMPLANT
GLOVE BIOGEL PI INDICATOR 7.0 (GLOVE) ×2
GOWN STRL REUS W/TWL LRG LVL3 (GOWN DISPOSABLE) ×6 IMPLANT
KIT ABG SYR 3ML LUER SLIP (SYRINGE) IMPLANT
NEEDLE HYPO 22GX1.5 SAFETY (NEEDLE) ×3 IMPLANT
NEEDLE HYPO 25X5/8 SAFETYGLIDE (NEEDLE) ×3 IMPLANT
NS IRRIG 1000ML POUR BTL (IV SOLUTION) ×3 IMPLANT
PACK C SECTION WH (CUSTOM PROCEDURE TRAY) ×3 IMPLANT
PAD ABD 7.5X8 STRL (GAUZE/BANDAGES/DRESSINGS) ×3 IMPLANT
PAD OB MATERNITY 4.3X12.25 (PERSONAL CARE ITEMS) ×3 IMPLANT
RTRCTR C-SECT PINK 25CM LRG (MISCELLANEOUS) ×3 IMPLANT
SPONGE GAUZE 4X4 12PLY STER LF (GAUZE/BANDAGES/DRESSINGS) ×3 IMPLANT
SPONGE LAP 18X18 X RAY DECT (DISPOSABLE) ×3 IMPLANT
STAPLER VISISTAT 35W (STAPLE) IMPLANT
STRIP CLOSURE SKIN 1/2X4 (GAUZE/BANDAGES/DRESSINGS) ×2 IMPLANT
SUT PDS AB 0 CT1 27 (SUTURE) IMPLANT
SUT PDS AB 0 CTX 36 PDP370T (SUTURE) IMPLANT
SUT PLAIN 2 0 XLH (SUTURE) ×3 IMPLANT
SUT VIC AB 0 CT1 36 (SUTURE) ×6 IMPLANT
SUT VIC AB 0 CTX 36 (SUTURE) ×4
SUT VIC AB 0 CTX36XBRD ANBCTRL (SUTURE) ×2 IMPLANT
SUT VIC AB 4-0 KS 27 (SUTURE) ×3 IMPLANT
SYR 30ML LL (SYRINGE) ×3 IMPLANT
TOWEL OR 17X24 6PK STRL BLUE (TOWEL DISPOSABLE) ×3 IMPLANT
TRAY FOLEY CATH 14FR (SET/KITS/TRAYS/PACK) IMPLANT
WATER STERILE IRR 1000ML POUR (IV SOLUTION) IMPLANT

## 2013-12-05 NOTE — Anesthesia Postprocedure Evaluation (Signed)
  Anesthesia Post-op Note Anesthesia Post Note  Patient: Anne Murillo  Procedure(s) Performed: Procedure(s) (LRB): Primary Cesarean Section Delivery Baby Girl @ 0136, Apgars 9/9 (N/A)  Anesthesia type: Epidural  Patient location: PACU  Post pain: Pain level controlled  Post assessment: Post-op Vital signs reviewed  Last Vitals:  Filed Vitals:   12/05/13 0400  BP: 131/75  Pulse: 95  Temp:   Resp: 18    Post vital signs: stable  Level of consciousness: awake  Complications: No apparent anesthesia complications

## 2013-12-05 NOTE — Addendum Note (Signed)
Addendum created 12/05/13 0720 by Ignacia Bayley, CRNA   Modules edited: Notes Section   Notes Section:  File: [id number]

## 2013-12-05 NOTE — Transfer of Care (Signed)
Immediate Anesthesia Transfer of Care Note  Patient: Anne Murillo  Procedure(s) Performed: Procedure(s): Primary Cesarean Section Delivery Baby Girl @ 0136, Apgars 9/9 (N/A)  Patient Location: PACU  Anesthesia Type:Epidural  Level of Consciousness: awake, alert , oriented and patient cooperative  Airway & Oxygen Therapy: Patient Spontanous Breathing  Post-op Assessment: Report given to PACU RN and Post -op Vital signs reviewed and stable  Post vital signs: Reviewed and stable  Complications: No apparent anesthesia complications

## 2013-12-05 NOTE — Anesthesia Postprocedure Evaluation (Signed)
  Anesthesia Post-op Note  Patient: Anne Murillo  Procedure(s) Performed: Procedure(s): Primary Cesarean Section Delivery Baby Girl @ 0136, Apgars 9/9 (N/A)  Patient Location: Mother/Baby  Anesthesia Type:Epidural  Level of Consciousness: awake  Airway and Oxygen Therapy: Patient Spontanous Breathing  Post-op Pain: mild  Post-op Assessment: Patient's Cardiovascular Status Stable and Respiratory Function Stable  Post-op Vital Signs: stable  Complications: No apparent anesthesia complications

## 2013-12-05 NOTE — Progress Notes (Signed)
UR completed 

## 2013-12-05 NOTE — Progress Notes (Signed)
Anne Murillo is a 28 y.o. G1P0 at [redacted]w[redacted]d admitted for induction of labor due to chronic HTN.  Subjective: Pt doing well. Resting with epidural.  +FM.    Objective: BP 130/59  Pulse 71  Temp(Src) 99.1 F (37.3 C) (Oral)  Resp 16  Ht 5' 2.99" (1.6 m)  Wt 189 lb (85.73 kg)  BMI 33.49 kg/m2  SpO2 98%  LMP 02/26/2013 I/O last 3 completed shifts: In: -  Out: 850 [Urine:850] Total I/O In: -  Out: 425 [Urine:425]  FHT:  FHR: 150 bpm, variability: minimal with periods of moderate,  accelerations:  Present,  decelerations:  Present occasional variables and occasional lates. Good scalp stim UC:   irregular, every 2-5 minutes  SVE:   Dilation: 5 Effacement (%): 70 Station: 0;-1 Exam by:: Dr.Beck + Molding and caput  Labs: Lab Results  Component Value Date   WBC 10.3 12/04/2013   HGB 12.0 12/04/2013   HCT 36.5 12/04/2013   MCV 82.6 12/04/2013   PLT 253 12/04/2013    Assessment / Plan: IOL for CHTN at [redacted]w[redacted]d, no cervical change and FHR is still concerning with recurrent late decelerations.  Recommended cesarean delivery.  The risks of cesarean section discussed with the patient included but were not limited to: bleeding which may require transfusion or reoperation; infection which may require antibiotics; injury to bowel, bladder, ureters or other surrounding organs; injury to the fetus; need for additional procedures including hysterectomy in the event of a life-threatening hemorrhage; placental abnormalities wth subsequent pregnancies, incisional problems, thromboembolic phenomenon and other postoperative/anesthesia complications. The patient concurred with the proposed plan, giving informed written consent for the procedure. Anesthesia and OR aware. Preoperative prophylactic antibiotics and SCDs ordered on call to the OR.  To OR when ready. This was discussed with patient and her husband.   Osborne Oman, MD 12/05/2013, 12:24 AM

## 2013-12-05 NOTE — Op Note (Signed)
Anne Murillo PROCEDURE DATE: 12/05/2013  PREOPERATIVE DIAGNOSES: Intrauterine pregnancy at [redacted]w[redacted]d weeks gestation; nonreassuring FHR pattern with recurrent late decelerations  POSTOPERATIVE DIAGNOSES: The same  PROCEDURE: Primary Low Transverse Cesarean Section  SURGEON:  Dr. Verita Schneiders  ASSISTANT:  Dr. Ebbie Latus  ANESTHESIOLOGIST: Dr. Assunta Gambles  INDICATIONS: Anne Murillo is a 28 y.o. G1P0 at [redacted]w[redacted]d here for cesarean section secondary to the indications listed under preoperative diagnoses; please see preoperative note for further details.  The risks of cesarean section were discussed with the patient including but were not limited to: bleeding which may require transfusion or reoperation; infection which may require antibiotics; injury to bowel, bladder, ureters or other surrounding organs; injury to the fetus; need for additional procedures including hysterectomy in the event of a life-threatening hemorrhage; placental abnormalities wth subsequent pregnancies, incisional problems, thromboembolic phenomenon and other postoperative/anesthesia complications.   The patient concurred with the proposed plan, giving informed written consent for the procedure.    FINDINGS:  Viable female infant in cephalic presentation.  Apgars 9 and 9.  Clear amniotic fluid.  Intact placenta, three vessel cord.  Hysterotomy extensions bilaterally resulting in increased blood loss.  Duplicated urachal remnant noted; the medial tubular structure was noted to be twice the size of the normal appearing lateral urachus.  Normal uterus, fallopian tubes and ovaries bilaterally.  ANESTHESIA: Epidural INTRAVENOUS FLUIDS: 3500 ml ESTIMATED BLOOD LOSS: 1300 ml URINE OUTPUT:  100 ml SPECIMENS: Placenta sent to pathology COMPLICATIONS: None immediate  PROCEDURE IN DETAIL:  The patient preoperatively received intravenous antibiotics and had sequential compression devices applied to her lower extremities.  She was  then taken to the operating room where the epidural anesthesia was dosed up to surgical level and was found to be adequate. She was then placed in a dorsal supine position with a leftward tilt, and prepped and draped in a sterile manner.  A foley catheter was placed into her bladder and attached to constant gravity.  After an adequate timeout was performed, a Pfannenstiel skin incision was made with scalpel and carried through to the underlying layer of fascia. The fascia was incised in the midline, and this incision was extended bilaterally using the Mayo scissors.  Kocher clamps were applied to the superior aspect of the fascial incision and the underlying rectus muscles were dissected off bluntly. A similar process was carried out on the inferior aspect of the fascial incision. The rectus muscles were separated in the midline bluntly and the peritoneum was entered bluntly. Attention was turned to the lower uterine segment where a low transverse hysterotomy was made with a scalpel and extended bilaterally bluntly.  The infant was successfully delivered, the cord was clamped and cut and the infant was handed over to awaiting neonatology team. Uterine massage was then administered, and the placenta delivered intact with a three-vessel cord. The uterus was then cleared of clot and debris.  The hysterotomy was closed with 0 Vicryl in a running locked fashion, and an imbricating layer was also placed with 0 Vicryl.  There was increased bleeding due to bilateral extension of the hysterotomy. The pelvis was cleared of all clot and debris. Hemostasis was confirmed on all surfaces.  The peritoneum and the muscles were reapproximated using 0 Vicryl interrupted stitches. The fascia was then closed using 0 Vicryl in a running fashion.  The subcutaneous layer was irrigated, and 30 ml of 0.5% Marcaine was injected subcutaneously around the incision.  The skin was closed with a 4-0 Vicryl subcuticular stitch. The  patient  tolerated the procedure well. Sponge, lap, instrument and needle counts were correct x 2.  She was taken to the recovery room in stable condition.   Verita Schneiders, MD, [place] Attending [place], [facility]

## 2013-12-06 ENCOUNTER — Encounter (HOSPITAL_COMMUNITY): Payer: Self-pay | Admitting: Obstetrics & Gynecology

## 2013-12-06 MED ORDER — DOCUSATE SODIUM 100 MG PO CAPS
100.0000 mg | ORAL_CAPSULE | Freq: Every day | ORAL | Status: DC
  Administered 2013-12-06 – 2013-12-07 (×2): 100 mg via ORAL
  Filled 2013-12-06: qty 1

## 2013-12-06 NOTE — Progress Notes (Signed)
I was present for the exam and agree with above. FeSO4.  [place], CNM 12/06/2013 10:42 AM

## 2013-12-06 NOTE — Progress Notes (Signed)
Subjective: Postpartum Day 1: Cesarean Delivery Patient reports tolerating PO, + flatus and no problems voiding.    Objective: Vital signs in last 24 hours: Temp:  [98.2 F (36.8 C)-99.3 F (37.4 C)] 98.8 F (37.1 C) (03/25 0510) Pulse Rate:  [82-130] 89 (03/25 0510) Resp:  [16-18] 17 (03/25 0510) BP: (109-139)/(67-78) 112/73 mmHg (03/25 0510) SpO2:  [93 %-99 %] 99 % (03/25 0510)  Physical Exam:  General: alert, cooperative and no distress Lochia: appropriate Uterine Fundus: firm Incision: healing well, no significant drainage, no dehiscence, no significant erythema DVT Evaluation: No evidence of DVT seen on physical exam. Negative Homan's sign. No cords or calf tenderness. No significant calf/ankle edema.   Recent Labs  12/05/13 0600 12/05/13 1208  HGB 9.5* 8.7*  HCT 28.8* 26.6*    Assessment/Plan: Status post Cesarean section. Doing well postoperatively.  Continue current care. - Stool softner, NO BM - Benadryl for itching  Phill Myron 12/06/2013, 8:02 AM

## 2013-12-06 NOTE — Lactation Note (Signed)
This note was copied from the chart of Girl Alonah Falkner. Lactation Consultation Note Mom concerned that baby wants to stay on the breast for an hour.  Reviewed nutritive sucking and how to keep the baby awake and sucking while at the breast.  Review supply and demand, feeding cues, tummy size and need to continue to breast feed frequently to bring mature milk in and to have a plentiful supply. Patient Name: Girl Nusaiba Stroupe Today's Date: 12/06/2013 Reason for consult: Follow-up assessment   Maternal Data Infant to breast within first hour of birth: Yes Has patient been taught Hand Expression?: Yes Does the patient have breastfeeding experience prior to this delivery?: No  Feeding Feeding Type: Breast Fed Length of feed: 30 min  LATCH Score/Interventions Latch: Grasps breast easily, tongue down, lips flanged, rhythmical sucking. Intervention(s): Skin to skin;Teach feeding cues;Waking techniques  Audible Swallowing: A few with stimulation Intervention(s): Skin to skin;Hand expression Intervention(s): Skin to skin;Hand expression  Type of Nipple: Everted at rest and after stimulation  Comfort (Breast/Nipple): Soft / non-tender     Hold (Positioning): No assistance needed to correctly position infant at breast. Intervention(s): Breastfeeding basics reviewed;Support Pillows;Position options;Skin to skin  LATCH Score: 9  Lactation Tools Discussed/Used WIC Program: Yes   Consult Status Consult Status: Follow-up Follow-up type: Call as needed    Jarold Motto 12/06/2013, 2:25 PM

## 2013-12-07 MED ORDER — DSS 100 MG PO CAPS: 100.0000 mg | ORAL_CAPSULE | Freq: Two times a day (BID) | ORAL | Status: DC | PRN

## 2013-12-07 MED ORDER — IBUPROFEN 600 MG PO TABS: 600.0000 mg | ORAL_TABLET | Freq: Four times a day (QID) | ORAL | Status: DC

## 2013-12-07 MED ORDER — OXYCODONE-ACETAMINOPHEN 5-325 MG PO TABS: 1.0000 | ORAL_TABLET | ORAL | Status: DC | PRN

## 2013-12-07 MED ORDER — FERROUS SULFATE 220 (44 FE) MG/5ML PO ELIX: 220.0000 mg | ORAL_SOLUTION | Freq: Every day | ORAL | Status: DC

## 2013-12-07 NOTE — Discharge Instructions (Signed)
Postpartum Care After Cesarean Delivery °After you deliver your newborn (postpartum period), the usual stay in the hospital is 24 72 hours. If there were problems with your labor or delivery, or if you have other medical problems, you might be in the hospital longer.  °While you are in the hospital, you will receive help and instructions on how to care for yourself and your newborn during the postpartum period.  °While you are in the hospital: °· It is normal for you to have pain or discomfort from the incision in your abdomen. Be sure to tell your nurses when you are having pain, where the pain is located, and what makes the pain worse. °· If you are breastfeeding, you may feel uncomfortable contractions of your uterus for a couple of weeks. This is normal. The contractions help your uterus get back to normal size. °· It is normal to have some bleeding after delivery. °· For the first 1 3 days after delivery, the flow is red and the amount may be similar to a period. °· It is common for the flow to start and stop. °· In the first few days, you may pass some small clots. Let your nurses know if you begin to pass large clots or your flow increases. °· Do not  flush blood clots down the toilet before having the nurse look at them. °· During the next 3 10 days after delivery, your flow should become more watery and pink or brown-tinged in color. °· Ten to fourteen days after delivery, your flow should be a small amount of yellowish-white discharge. °· The amount of your flow will decrease over the first few weeks after delivery. Your flow may stop in 6 8 weeks. Most women have had their flow stop by 12 weeks after delivery. °· You should change your sanitary pads frequently. °· Wash your hands thoroughly with soap and water for at least 20 seconds after changing pads, using the toilet, or before holding or feeding your newborn. °· Your intravenous (IV) tubing will be removed when you are drinking enough fluids. °· The  urine drainage tube (urinary catheter) that was inserted before delivery may be removed within 6 8 hours after delivery or when feeling returns to your legs. You should feel like you need to empty your bladder within the first 6 8 hours after the catheter has been removed. °· In case you become weak, lightheaded, or faint, call your nurse before you get out of bed for the first time and before you take a shower for the first time. °· Within the first few days after delivery, your breasts may begin to feel tender and full. This is called engorgement. Breast tenderness usually goes away within 48 72 hours after engorgement occurs. You may also notice milk leaking from your breasts. If you are not breastfeeding, do not stimulate your breasts. Breast stimulation can make your breasts produce more milk. °· Spending as much time as possible with your newborn is very important. During this time, you and your newborn can feel close and get to know each other. Having your newborn stay in your room (rooming in) will help to strengthen the bond with your newborn. It will give you time to get to know your newborn and become comfortable caring for your newborn. °· Your hormones change after delivery. Sometimes the hormone changes can temporarily cause you to feel sad or tearful. These feelings should not last more than a few days. If these feelings last longer   than that, you should talk to your caregiver. °· If desired, talk to your caregiver about methods of family planning or contraception. °· Talk to your caregiver about immunizations. Your caregiver may want you to have the following immunizations before leaving the hospital: °· Tetanus, diphtheria, and pertussis (Tdap) or tetanus and diphtheria (Td) immunization. It is very important that you and your family (including grandparents) or others caring for your newborn are up-to-date with the Tdap or Td immunizations. The Tdap or Td immunization can help protect your newborn  from getting ill. °· Rubella immunization. °· Varicella (chickenpox) immunization. °· Influenza immunization. You should receive this annual immunization if you did not receive the immunization during your pregnancy. °Document Released: 05/25/2012 Document Reviewed: 05/25/2012 °Exit[employer]® Patient Information ©2014 [employer]. ° °

## 2013-12-07 NOTE — Discharge Summary (Signed)
Physician Obstetric Discharge Summary  Patient ID: Anne Murillo MRN: [id number] DOB/AGE: 1986-08-21 28 y.o.  Reason for Admission: IOL for chronic HTN Prenatal Procedures: none Intrapartum Procedures: cesarean: low cervical, transverse and GBS prophylaxis Postpartum Procedures: none Complications-Operative and Postpartum: none  Delivery Note PREOPERATIVE DIAGNOSES: Intrauterine pregnancy at [redacted]w[redacted]d weeks gestation; nonreassuring FHR pattern with recurrent late decelerations  POSTOPERATIVE DIAGNOSES: The same  PROCEDURE: Primary Low Transverse Cesarean Section  SURGEON: Dr. Verita Schneiders  ASSISTANT: Dr. Ebbie Latus  ANESTHESIOLOGIST: Dr. Assunta Gambles   INDICATIONS: Anne Murillo is a 28 y.o. G1P0 at [redacted]w[redacted]d here for cesarean section secondary to the indications listed under preoperative diagnoses; please see preoperative note for further details. The risks of cesarean section were discussed with the patient including but were not limited to: bleeding which may require transfusion or reoperation; infection which may require antibiotics; injury to bowel, bladder, ureters or other surrounding organs; injury to the fetus; need for additional procedures including hysterectomy in the event of a life-threatening hemorrhage; placental abnormalities wth subsequent pregnancies, incisional problems, thromboembolic phenomenon and other postoperative/anesthesia complications. The patient concurred with the proposed plan, giving informed written consent for the procedure.   FINDINGS: Viable female infant in cephalic presentation. Apgars 9 and 9. Clear amniotic fluid. Intact placenta, three vessel cord. Hysterotomy extensions bilaterally resulting in increased blood loss. Duplicated urachal remnant noted; the medial tubular structure was noted to be twice the size of the normal appearing lateral urachus. Normal uterus, fallopian tubes and ovaries bilaterally.   ANESTHESIA: Epidural  INTRAVENOUS FLUIDS: 3500  ml  ESTIMATED BLOOD LOSS: 1300 ml  URINE OUTPUT: 100 ml  SPECIMENS: Placenta sent to pathology  COMPLICATIONS: None immediate   H/H:  Lab Results  Component Value Date/Time   HGB 8.7* 12/05/2013 12:08 PM   HCT 26.6* 12/05/2013 12:08 PM    Brief Hospital Course: Anne Murillo is a [id number] who underwent cesarean section on 12/03/2013 - 12/05/2013.  Patient had an uncomplicated surgery; for further details of this surgery, please refer to the operative note.  Patient had an uncomplicated postpartum course.  By time of discharge on POD/PPD#2, her pain was controlled on oral pain medications; she had appropriate lochia and was ambulating, voiding without difficulty, tolerating regular diet and passing flatus.   She was deemed stable for discharge to home.    Physical Exam:  General: alert, cooperative and no distress Lochia: appropriate Uterine Fundus: firm Incision: healing well, no significant drainage, no dehiscence, no significant erythema, honeycomb dressing in place DVT Evaluation: No evidence of DVT seen on physical exam. Negative Homan's sign. No cords or calf tenderness. No significant calf/ankle edema.  Discharge Diagnoses: Term Pregnancy-delivered  Discharge Information: Date: 12/07/2013 Activity: pelvic rest Diet: routine Baby feeding: plans to breastfeed Contraception: condoms, Advised more reliabe birth control for the next 9 months to prevent unplanned pregnancy until her c-section incision have healed.  Medications: PNV, Ibuprofen, Colace and Percocet Discharged Condition: good Instructions: refer to practice specific booklet Discharge to: home  Signed: Phill Myron, M.D. FM PGY-1  12/07/2013, 9:47 AM  I have seen and examined this patient and agree with above documentation in the resident's note.   Ebbie Latus, M.D. [facility] Fellow 12/07/2013 11:53 AM

## 2013-12-07 NOTE — Discharge Summary (Signed)
Attestation of Attending Supervision of Fellow: Evaluation and management procedures were performed by the Fellow under my supervision and collaboration.  I have reviewed the Fellow's note and chart, and I agree with the management and plan.    

## 2013-12-27 NOTE — H&P (Signed)
Attestation of Attending Supervision of Advanced Practitioner: Evaluation and management procedures were performed by the PA/NP/CNM/OB Fellow under my supervision/collaboration. Chart reviewed and agree with management and plan.  Jonnie Kind 12/27/2013 10:17 PM

## 2014-01-11 ENCOUNTER — Encounter: Payer: Self-pay | Admitting: Obstetrics & Gynecology

## 2014-01-11 ENCOUNTER — Ambulatory Visit (INDEPENDENT_AMBULATORY_CARE_PROVIDER_SITE_OTHER): Payer: Medicaid Other | Admitting: Obstetrics & Gynecology

## 2014-01-11 NOTE — Patient Instructions (Signed)
Contraceptive Barrier Methods A barrier method is a type of birth control (contraception) that is used to prevent pregnancy. These methods include:   Female condom.   Female condom.   Diaphragm.   Cervical cap.   Sponge.   Spermicide.  Your health care provider can help you decide what form of contraception is best for you. Always keep in mind the risks of sexually transmitted infections (STIs).  FEMALE CONDOM A female condom is a thin sheath (latex or rubber) that is worn over the penis during sexual intercourse. The condom prevents pregnancy by catching and stopping the sperm from reaching the uterus. Condoms may come with a spermicide on them, and they can only be worn once. Condoms should not be used with petroleum jelly, lotions, or oils. These things decrease their effectiveness. Condoms can be used with water-based lubricants. Condoms help protect against STIs. Latex and polyurethane condoms provide the best available protection against many STIs, including HIV.  FEMALE CONDOM The female condom is a soft, loose-fitting sheath that is put into the vagina before sexual intercourse. It prevents pregnancy by catching the sperm in the condom and blocking the passage of sperm to the uterus. It is intended for one-time use only. A female partner should not use a condom at the same time. The female and female condoms may stick together and break. A female condom can be inserted as long as 8 hours before intercourse. Condoms help protect against STIs.  DIAPHRAGM A diaphragm is a soft, latex, dome-shaped barrier that is placed in the vagina with spermicidal jelly before sexual intercourse. It covers the cervix, kills sperm, and blocks the passage of semen into the cervix. The diaphragm can be inserted up to 2 hours before sex. If it is inserted more than 2 hours before intercourse, then spermicide must be applied again. The diaphragm should be left in the vagina for 6 8 hours after intercourse. It must  be fitted by a health care provider. This method does not protect against STIs.  CERVICAL CAP A cervical cap is a round, soft, latex or plastic cup that is put in the vagina and fits over the cervix. It may be inserted as long as 6 hours before sexual activity. It must be left in place for at least 6 hours after intercourse and can be left in place for as long as 48 hours. It provides continuous protection as long as it is in place, regardless of the number of intercourse acts. The cervical cap cannot be used during your period. It must be fitted by a health care provider. Cervical caps do not protect against STIs. SPONGE A sponge is a soft, circular piece of polyurethane foam that has spermicide in it. The sponge has a loop for removal. It is inserted into the vagina after wetting it and is placed over the cervix before sexual intercourse. The foam is designed to trap and absorb sperm before it enters the cervix. The spermicide kills or immobilizes sperm. The sponge offers an immediate and continuous presence of spermicide throughout a 24-hour period regardless of the number of intercourse acts. The sponge should be left in place for at least 6 hours after sex. It should not be left in for more than 24 hours, and it cannot be reused. The sponge does not protect against STIs. SPERMICIDES Spermicides are chemicals that kill or block sperm from entering the cervix and uterus. They come in the form of creams, jellies, suppositories, foam, film, or tablets. The film, tablets,  and suppositories should be inserted 10 to 30 minutes before sexual intercourse so they can dissolve. They are inserted into the vagina with an applicator before having sexual intercourse. This must be repeated every time you have sexual intercourse. The use of spermicides does not protect against STIs. Document Released: 06/28/2007 Document Revised: 05/03/2013 Document Reviewed: 02/12/2013 [facility] Patient Information 2014 [place].

## 2014-01-11 NOTE — Progress Notes (Signed)
Patient ID: Anne Murillo, female   DOB: 24-Jun-1986, 28 y.o.   MRN: [id number] Subjective:     [name] is a 28 y.o. female who presents for a postpartum visit. She is 5 weeks postpartum following a low cervical transverse Cesarean section. I have fully reviewed the prenatal and intrapartum course. The delivery was at 40 gestational weeks. Outcome: primary cesarean section, low transverse incision. Anesthesia: epidural. Postpartum course has been good. Baby's course has been good. Baby is feeding by breast. Bleeding no bleeding. Bowel function is normal. Bladder function is normal. Patient is not sexually active. Contraception method is condoms. Postpartum depression screening: negative.  The following portions of the patient's history were reviewed and updated as appropriate: allergies, current medications, past family history, past medical history, past social history, past surgical history and problem list.  Review of Systems Pertinent items are noted in HPI.   Objective:    BP 114/68  Pulse 85  Temp(Src) 97.3 F (36.3 C) (Oral)  Ht 5' 1.5" (1.562 m)  Wt 165 lb (74.844 kg)  BMI 30.68 kg/m2  Breastfeeding? Yes  General:  alert, cooperative and no distress   Breasts:     Lungs:    Heart:     Abdomen: soft, non-tender; bowel sounds normal; no masses,  no organomegaly and incision intact   Vulva:  not evaluated  Vagina: not evaluated  Cervix:     Corpus: not examined  Adnexa:  not evaluated  Rectal Exam: Not performed.        Assessment:     normal postpartum exam. Pap smear not done at today's visit.   Plan:    1. Contraception: condoms and vaginal spermicide 2.Prenatal vitamins 3. Follow up as needed.   Woodroe Mode, MD 01/11/2014

## 2014-07-16 ENCOUNTER — Encounter: Payer: Self-pay | Admitting: Obstetrics & Gynecology

## 2015-02-13 ENCOUNTER — Ambulatory Visit (INDEPENDENT_AMBULATORY_CARE_PROVIDER_SITE_OTHER): Payer: 59 | Admitting: Medical

## 2015-02-13 ENCOUNTER — Encounter: Payer: Self-pay | Admitting: Medical

## 2015-02-13 VITALS — BP 110/70 | HR 85 | Temp 98.1°F | Resp 15 | Wt 186.0 lb

## 2015-02-13 DIAGNOSIS — K5900 Constipation, unspecified: Secondary | ICD-10-CM

## 2015-02-13 DIAGNOSIS — R102 Pelvic and perineal pain: Secondary | ICD-10-CM | POA: Diagnosis not present

## 2015-02-13 DIAGNOSIS — N898 Other specified noninflammatory disorders of vagina: Secondary | ICD-10-CM

## 2015-02-13 DIAGNOSIS — R1084 Generalized abdominal pain: Secondary | ICD-10-CM

## 2015-02-13 LAB — CBC WITH DIFFERENTIAL/PLATELET
Basophils Absolute: 0 K/uL (ref 0.0–0.1)
Basophils Relative: 0 % (ref 0–1)
Eosinophils Absolute: 0.1 K/uL (ref 0.0–0.7)
Eosinophils Relative: 2 % (ref 0–5)
HCT: 40.2 % (ref 36.0–46.0)
Hemoglobin: 13.3 g/dL (ref 12.0–15.0)
Lymphocytes Relative: 37 % (ref 12–46)
Lymphs Abs: 2.2 K/uL (ref 0.7–4.0)
MCH: 27.3 pg (ref 26.0–34.0)
MCHC: 33.1 g/dL (ref 30.0–36.0)
MCV: 82.5 fL (ref 78.0–100.0)
MPV: 10.2 fL (ref 8.6–12.4)
Monocytes Absolute: 0.5 K/uL (ref 0.1–1.0)
Monocytes Relative: 8 % (ref 3–12)
Neutro Abs: 3.1 K/uL (ref 1.7–7.7)
Neutrophils Relative %: 53 % (ref 43–77)
Platelets: 215 K/uL (ref 150–400)
RBC: 4.87 MIL/uL (ref 3.87–5.11)
RDW: 14.4 % (ref 11.5–15.5)
WBC: 5.9 K/uL (ref 4.0–10.5)

## 2015-02-13 LAB — POCT URINE PREGNANCY: Preg Test, Ur: NEGATIVE

## 2015-02-13 LAB — POCT URINALYSIS DIPSTICK
BILIRUBIN UA: NEGATIVE
GLUCOSE UA: NEGATIVE
KETONES UA: NEGATIVE
LEUKOCYTES UA: NEGATIVE
NITRITE UA: NEGATIVE
PROTEIN UA: NEGATIVE
RBC UA: NEGATIVE
Spec Grav, UA: >=1.03
Urobilinogen, UA: NEGATIVE
pH, UA: 6

## 2015-02-13 LAB — COMPREHENSIVE METABOLIC PANEL
ALBUMIN: 4.4 g/dL (ref 3.5–5.2)
ALT: 18 U/L (ref 0–35)
AST: 20 U/L (ref 0–37)
Alkaline Phosphatase: 58 U/L (ref 39–117)
BUN: 11 mg/dL (ref 6–23)
CALCIUM: 9.5 mg/dL (ref 8.4–10.5)
CHLORIDE: 104 meq/L (ref 96–112)
CO2: 24 mEq/L (ref 19–32)
Creat: 0.77 mg/dL (ref 0.50–1.10)
Glucose, Bld: 85 mg/dL (ref 70–99)
Potassium: 4.1 mEq/L (ref 3.5–5.3)
Sodium: 137 mEq/L (ref 135–145)
Total Bilirubin: 1 mg/dL (ref 0.2–1.2)
Total Protein: 7 g/dL (ref 6.0–8.3)

## 2015-02-13 LAB — POCT WET PREP (WET MOUNT): CLUE CELLS WET PREP WHIFF POC: NEGATIVE

## 2015-02-13 LAB — LIPASE: LIPASE: 21 U/L (ref 0–75)

## 2015-02-13 MED ORDER — FLUCONAZOLE 150 MG PO TABS: 150.0000 mg | ORAL_TABLET | Freq: Once | ORAL | Status: DC

## 2015-02-13 MED ORDER — LINACLOTIDE 145 MCG PO CAPS: 145.0000 ug | ORAL_CAPSULE | Freq: Every day | ORAL | Status: DC

## 2015-02-13 NOTE — Progress Notes (Signed)
Subjective: Here as a new patient today.  [place]  Here with interpreter Kerrin Champagne from [employer].  Here for c/o abdominal pain, generalized, x 2 months.   When she presses on the abdomen, gets pain everywhere.  Pain is daily, intermittent.    GI - poop is hard to come out, sometimes hard stool.  Has BM 3-4 x/week.  Denies blood in stool.  Does get gassy and bloated at times.  No hx/o hemorrhoids.  Food intake doesn't change the pain.  Denies fever, no nausea or vomiting.  No diarrhea.  +fatigue.    GU - denies urinary issues, no frequency, urgency, dysuria, no odor to urine, no hx/o UTI.   Gyn - last pregnancy- last baby born over a year.  Gyn hx/o is 1 live birth, 1 pregnancy by C -section.   Is sexually active, married, no concern for pregnancy currently, no current birth control.  LMP Jan 15, 2015.  Denies vaginal bleeding.  Does note some vaginal discharge x 2 week.  No recent breast tenderness.  She notes that periods are regular in general.  Has hx/o c - section, no other abdominal surgery.    No current treatment for symptoms.   No prior similar.    Objective: BP 110/70 mmHg  Pulse 85  Temp(Src) 98.1 F (36.7 C) (Oral)  Resp 15  Wt 186 lb (84.369 kg)  General appearance: alert, no distress, WD/WN, AA female Oral cavity: MMM, no lesions Neck: supple, no lymphadenopathy, no thyromegaly, no masses Heart: RRR, normal S1, S2, no murmurs Lungs: CTA bilaterally, no wheezes, rhonchi, or rales Abdomen: +increased bs, soft, tender in left side throughout, lower and RLQ/pelvis, non distended, no masses, no hepatomegaly, no splenomegaly Pulses: 2+ symmetric, upper and lower extremities, normal cap refill Gyn: normal female external genitalia, no rash, pink moist mucosa, white thick curdish discharge, cervix normal appearing, no adnexal mass or tenderness, uterus WNL, exam chaperoned by nurse   Assessment: Encounter Diagnoses  Name Primary?  .  Generalized abdominal pain Yes  . Pelvic pain in female   . Vaginal discharge   . Constipation, unspecified constipation type     Plan: abdominal and pelvic pain.  Discussed symptoms, possible causes.   UA unremarkable.  Urine pregnancy negative.  Labs today.   discussed fiber and water intake for likely constipation. Begin Diflucan for yeast, begin samples of Linzess for constipation.   F/u pending labs.

## 2015-02-14 LAB — GC/CHLAMYDIA PROBE AMP
CT PROBE, AMP APTIMA: NEGATIVE
GC Probe RNA: NEGATIVE

## 2015-04-08 ENCOUNTER — Ambulatory Visit (INDEPENDENT_AMBULATORY_CARE_PROVIDER_SITE_OTHER): Payer: 59 | Admitting: Medical

## 2015-04-08 VITALS — BP 100/80 | HR 80 | Temp 98.4°F | Resp 15 | Wt 185.0 lb

## 2015-04-08 DIAGNOSIS — Z719 Counseling, unspecified: Secondary | ICD-10-CM | POA: Diagnosis not present

## 2015-04-08 DIAGNOSIS — K5900 Constipation, unspecified: Secondary | ICD-10-CM | POA: Diagnosis not present

## 2015-04-08 DIAGNOSIS — Z7182 Exercise counseling: Secondary | ICD-10-CM

## 2015-04-08 DIAGNOSIS — R140 Abdominal distension (gaseous): Secondary | ICD-10-CM

## 2015-04-08 MED ORDER — LINACLOTIDE 145 MCG PO CAPS: 145.0000 ug | ORAL_CAPSULE | Freq: Every day | ORAL | Status: DC

## 2015-04-08 NOTE — Progress Notes (Signed)
Subjective: Here for f/u. I saw her as a new patient in June, and I see her husband as a patient.  He is translating today.  They are originally from [place].  Here for recheck on abdominal pain.  Last visit she was having abdominal pain, generalized, bloating.  She took 16 days of Linzess samples which made a big difference, but didn't realize she had prescription at the pharmacy for the same, so has been out of the medication since middle of June.  poop is hard to come out, sometimes hard stool.  Has BM 3-4 x/week.  Denies blood in stool.  Does get gassy and bloated at times.  No hx/o hemorrhoids.  Food intake doesn't change the pain.  Denies fever, no nausea or vomiting.  No diarrhea.  Denies blood in stool, no mucous, BMs have been pretty good, but still has the bloating.    She also reports unhappiness with the look of her abdomen since last pregnancy.  Not exercising.  GU - denies urinary issues, no frequency, urgency, dysuria, no odor to urine, no hx/o UTI.   Gyn - last pregnancy- last baby born over a year.  Gyn hx/o is 1 live birth, 1 pregnancy by C -section.   Is sexually active, married, no concern for pregnancy currently, no current birth control.  Denies vaginal bleeding or discharge, , no breast tenderness.  She notes that periods are regular in general.  Has hx/o c - section, no other abdominal surgery.    No current treatment for symptoms.   No prior similar.    Objective: BP 100/80 mmHg  Pulse 80  Temp(Src) 98.4 F (36.9 C) (Oral)  Resp 15  Wt 185 lb (83.915 kg)  General appearance: alert, no distress, WD/WN, AA female Abdomen: +bs, soft, slight middle abdomen tenderness, othewrise no masses, no hepatomegaly, no splenomegaly   Assessment: Encounter Diagnoses  Name Primary?  . Abdominal bloating Yes  . Constipation, unspecified constipation type   . Exercise counseling     Plan: unfortunately she didn't realize there was a prescription at the  pharmacy, this was lost in translation apparently last visit.  Restart Linzess, discussed fiber and water intake. Discussed routine exercise, diet discretion, working on weight loss and core strengthening and toning of abdomen.   Gave several exercise to do regularly as well as walking daily for exercise If still having belly pains after several weeks of medication, consider abdominal and pelvic imaging.

## 2015-04-17 ENCOUNTER — Telehealth: Payer: Self-pay | Admitting: Medical

## 2015-04-19 NOTE — Telephone Encounter (Signed)
P.A. Rolan Lipa approved til 10/18/15, faxed pharmacy, called pt and she speaks very little English, she will have husband call

## 2015-04-23 NOTE — Telephone Encounter (Signed)
Husband informed

## 2015-05-02 ENCOUNTER — Ambulatory Visit (INDEPENDENT_AMBULATORY_CARE_PROVIDER_SITE_OTHER): Payer: 59 | Admitting: Medical

## 2015-05-02 ENCOUNTER — Encounter: Payer: Self-pay | Admitting: Medical

## 2015-05-02 VITALS — BP 122/82 | HR 84 | Temp 98.2°F | Resp 18 | Wt 186.0 lb

## 2015-05-02 DIAGNOSIS — R319 Hematuria, unspecified: Secondary | ICD-10-CM

## 2015-05-02 DIAGNOSIS — K5909 Other constipation: Secondary | ICD-10-CM | POA: Diagnosis not present

## 2015-05-02 DIAGNOSIS — R300 Dysuria: Secondary | ICD-10-CM

## 2015-05-02 DIAGNOSIS — N3001 Acute cystitis with hematuria: Secondary | ICD-10-CM

## 2015-05-02 LAB — POCT URINALYSIS DIPSTICK
Bilirubin, UA: NEGATIVE
Glucose, UA: NEGATIVE
KETONES UA: NEGATIVE
Nitrite, UA: NEGATIVE
Protein, UA: NEGATIVE
Spec Grav, UA: 1.025
UROBILINOGEN UA: NEGATIVE
pH, UA: 6

## 2015-05-02 LAB — POCT URINE PREGNANCY: Preg Test, Ur: NEGATIVE

## 2015-05-02 MED ORDER — SULFAMETHOXAZOLE-TRIMETHOPRIM 800-160 MG PO TABS: 1.0000 | ORAL_TABLET | Freq: Two times a day (BID) | ORAL | Status: DC

## 2015-05-02 MED ORDER — PHENAZOPYRIDINE HCL 200 MG PO TABS: 200.0000 mg | ORAL_TABLET | Freq: Three times a day (TID) | ORAL | Status: DC | PRN

## 2015-05-02 NOTE — Progress Notes (Signed)
Subjective: Chief Complaint  Patient presents with  . Urinary Tract Infection    complaints of blood in urine and pain in lower abd for 1 week, denies history of kidney stone or UTI, thinks it is related to an ulcer? denies fever chills nausea or vomiting   Here today with her husband who translates.  Having 1 week hx/o blood in urine, lower abdominal pain.  She does have pain and burning with urination.  No prior UTI.    No fever, chills, nausea, vomiting.  LMP 04/26/15 she thinks.   After 2 days period seemed to stop, then started getting the urine problem.   No back pain.   Is taking Linzess daily.   Doing fine.   No vaginal discharge, no pain with intercourse.   No contraception or birth control.   Doing fine with constipation on Linzess.  ROS as in subjective otherwise   Objective: BP 122/82 mmHg  Pulse 84  Temp(Src) 98.2 F (36.8 C) (Oral)  Resp 18  Wt 186 lb (84.369 kg)  LMP 04/26/2015  Gen: wd, wn, nad Abdomen: +suprapubic tenderness, no mass, no organomegaly, soft Back: no CVA tenderness GU deferred    Assessment: Encounter Diagnoses  Name Primary?  . Acute cystitis with hematuria Yes  . Hematuria   . Dysuria   . Other constipation     Plan: UA and symptoms suggest UTI.  advised increased water intake, cranberry juice for a few days, begin Bactrim, Pyridium prn and we will call with culture results.   discussed possible complications, pyelo, or other symptoms that would prompt visit to the ED.    Constipation much improved on Linzess.

## 2015-05-02 NOTE — Addendum Note (Signed)
Addended by: Louie Bun on: 05/02/2015 09:50 AM   Modules accepted: Orders

## 2015-05-05 LAB — URINE CULTURE: Colony Count: >=100000

## 2015-05-07 ENCOUNTER — Encounter: Payer: Self-pay | Admitting: Internal Medicine

## 2015-06-04 ENCOUNTER — Ambulatory Visit (INDEPENDENT_AMBULATORY_CARE_PROVIDER_SITE_OTHER): Payer: 59 | Admitting: Medical

## 2015-06-04 ENCOUNTER — Encounter: Payer: Self-pay | Admitting: Medical

## 2015-06-04 VITALS — BP 112/70 | HR 80 | Temp 98.4°F | Resp 18 | Wt 187.6 lb

## 2015-06-04 DIAGNOSIS — R4789 Other speech disturbances: Secondary | ICD-10-CM

## 2015-06-04 DIAGNOSIS — Z3201 Encounter for pregnancy test, result positive: Secondary | ICD-10-CM

## 2015-06-04 DIAGNOSIS — Z789 Other specified health status: Secondary | ICD-10-CM

## 2015-06-04 DIAGNOSIS — K5909 Other constipation: Secondary | ICD-10-CM

## 2015-06-04 DIAGNOSIS — K5900 Constipation, unspecified: Secondary | ICD-10-CM | POA: Diagnosis not present

## 2015-06-04 DIAGNOSIS — Z23 Encounter for immunization: Secondary | ICD-10-CM | POA: Diagnosis not present

## 2015-06-04 LAB — HCG, QUANTITATIVE, PREGNANCY: hCG, Beta Chain, Quant, S: 5981.1 m[IU]/mL

## 2015-06-04 LAB — POCT URINALYSIS DIPSTICK
Bilirubin, UA: NEGATIVE
GLUCOSE UA: NEGATIVE
Ketones, UA: NEGATIVE
Leukocytes, UA: NEGATIVE
Nitrite, UA: NEGATIVE
PH UA: 6
PROTEIN UA: NEGATIVE
RBC UA: NEGATIVE
UROBILINOGEN UA: NEGATIVE

## 2015-06-04 LAB — POCT URINE PREGNANCY: PREG TEST UR: POSITIVE — AB

## 2015-06-04 MED ORDER — PRENATAL VITAMINS 28-0.8 MG PO TABS: 1.0000 | ORAL_TABLET | Freq: Every day | ORAL | Status: DC

## 2015-06-04 NOTE — Addendum Note (Signed)
Addended by: Patience Musca F on: 06/04/2015 10:10 AM   Modules accepted: Orders

## 2015-06-04 NOTE — Addendum Note (Signed)
Addended by: Patience Musca F on: 06/04/2015 10:03 AM   Modules accepted: Orders

## 2015-06-04 NOTE — Progress Notes (Signed)
   Subjective: Chief Complaint  Patient presents with  . possible pregnancy   Here with interpreter.   Husband and child are out in the lobby.  She is [place].   Her dialect is [place], but speaks [place], and [place] interpreter is here with her today.   I see her husband as a patient as well.    She is here for possible pregnancy.    Her LMP was July 10th.  Prior to July periods were regular.  However, she has not had a period since July 10th.  She does note having blood in urine for a day or 2 in August but this cleared up.  Her only recent symptom has been breast tenderness and occasionally lower abdominal discomfort that is mild.  She feels fine today and last few days.   No vaginal discharge, no fever, no other c/o.   constipation has been controlled on Linzess.  She is not taking a prenatal vitamin.   Her last pregnancy a little over 21 months ago was through [facility] teaching service.    Not on OCPs.  Is actively trying to get pregnant.   Last week did home pregnancy test which was +.  No other c/o.   Past Medical History  Diagnosis Date  . Medical history non-contributory    ROS as in subjective   Objective: BP 112/70 mmHg  Pulse 80  Temp(Src) 98.4 F (36.9 C) (Oral)  Resp 18  Wt 187 lb 9.6 oz (85.095 kg)  LMP 03/24/2015  Gen: wd, wn, nad, AA female Heart: RRR, normal S1, S2, no murmurs lungs clear Abdomen: +bs, soft, nontender, no mass, no organomegaly pulses normal Ext: no edema    Assessment: Encounter Diagnoses  Name Primary?  . Pregnancy test positive Yes  . Need for prophylactic vaccination and inoculation against influenza   . Non-English speaking patient   . Chronic constipation     Plan: Discussed pregnancy precautions, diet, prenatal vitamins, exercise, importance of prenatal care.   Her last child who is a little over a year old was delivered through teaching service at [facility].   We will go ahead and make referral  to obstetrician.   Answered her questions.  Counseled on the influenza virus vaccine.  Vaccine information sheet given.  Influenza vaccine given after consent obtained.  She has been doing fine on linzess but advised she stop this for now, let OB/gyn decide if ok for her to c/t this.   Discussed diet/constipation precautions.  Patient voiced understanding of diagnosis, recommendations, and treatment plan.

## 2015-06-05 ENCOUNTER — Other Ambulatory Visit: Payer: Self-pay | Admitting: Medical

## 2015-06-05 ENCOUNTER — Encounter: Payer: Self-pay | Admitting: Internal Medicine

## 2015-06-05 DIAGNOSIS — Z3201 Encounter for pregnancy test, result positive: Secondary | ICD-10-CM

## 2015-06-05 NOTE — Addendum Note (Signed)
Addended by: Patience Musca F on: 06/05/2015 03:11 PM   Modules accepted: Orders

## 2015-06-06 ENCOUNTER — Encounter: Payer: Self-pay | Admitting: Internal Medicine

## 2015-06-13 ENCOUNTER — Ambulatory Visit (HOSPITAL_COMMUNITY)
Admission: RE | Admit: 2015-06-13 | Discharge: 2015-06-13 | Disposition: A | Discharge status: Home / Self Care | Payer: Medicaid Other | Source: Ambulatory Visit | Attending: Medical | Admitting: Medical

## 2015-06-13 ENCOUNTER — Other Ambulatory Visit: Payer: Self-pay | Admitting: Medical

## 2015-06-13 DIAGNOSIS — D259 Leiomyoma of uterus, unspecified: Secondary | ICD-10-CM | POA: Insufficient documentation

## 2015-06-13 DIAGNOSIS — Z3A01 Less than 8 weeks gestation of pregnancy: Secondary | ICD-10-CM | POA: Insufficient documentation

## 2015-06-13 DIAGNOSIS — Z36 Encounter for antenatal screening of mother: Secondary | ICD-10-CM | POA: Insufficient documentation

## 2015-06-13 DIAGNOSIS — O208 Other hemorrhage in early pregnancy: Secondary | ICD-10-CM | POA: Insufficient documentation

## 2015-06-13 DIAGNOSIS — Z3201 Encounter for pregnancy test, result positive: Secondary | ICD-10-CM

## 2015-06-13 DIAGNOSIS — O3411 Maternal care for benign tumor of corpus uteri, first trimester: Secondary | ICD-10-CM | POA: Insufficient documentation

## 2015-06-20 ENCOUNTER — Telehealth: Payer: Self-pay | Admitting: Medical

## 2015-06-20 ENCOUNTER — Encounter: Payer: Self-pay | Admitting: Medical

## 2015-06-20 NOTE — Telephone Encounter (Signed)
Husband called stating he needs letter for Medicaid stating due date per the ultrasound due to discrepancy on LMP  This was typed

## 2015-07-01 ENCOUNTER — Telehealth: Payer: Self-pay | Admitting: Internal Medicine

## 2015-07-01 NOTE — Telephone Encounter (Signed)
Pt can be seen still at [facility] tomorrow 07/02/15 with Dr. Rogue Bussing. I have done referral Through [facility] and placed it in blue bin up front to be SCANNED INTO PT CHART.  Referral #  [id number]    Fax # [phone]    Starting October 1st they stop taking medicaid secondary but because appointment was made before then they will still take patient on tomorrow for appt

## 2015-07-02 ENCOUNTER — Other Ambulatory Visit: Payer: Self-pay

## 2015-07-03 LAB — CYTOLOGY - PAP

## 2015-07-20 ENCOUNTER — Ambulatory Visit (HOSPITAL_COMMUNITY)
Admission: AD | Admit: 2015-07-20 | Discharge: 2015-07-20 | Disposition: A | Discharge status: Home / Self Care | Payer: Medicaid Other | Source: Ambulatory Visit | Attending: Obstetrics and Gynecology | Admitting: Obstetrics and Gynecology

## 2015-07-20 ENCOUNTER — Inpatient Hospital Stay (HOSPITAL_COMMUNITY): Payer: Medicaid Other

## 2015-07-20 ENCOUNTER — Encounter (HOSPITAL_COMMUNITY): Payer: Self-pay | Admitting: *Deleted

## 2015-07-20 ENCOUNTER — Inpatient Hospital Stay (HOSPITAL_COMMUNITY): Payer: Medicaid Other | Admitting: Anesthesiology

## 2015-07-20 ENCOUNTER — Encounter (HOSPITAL_COMMUNITY)
Admission: AD | Disposition: A | Discharge status: Home / Self Care | Payer: Self-pay | Source: Ambulatory Visit | Attending: Obstetrics and Gynecology

## 2015-07-20 DIAGNOSIS — O021 Missed abortion: Secondary | ICD-10-CM | POA: Insufficient documentation

## 2015-07-20 HISTORY — PX: DILATION AND EVACUATION: SHX1459

## 2015-07-20 LAB — URINALYSIS, ROUTINE W REFLEX MICROSCOPIC
BILIRUBIN URINE: NEGATIVE
Glucose, UA: NEGATIVE mg/dL
Ketones, ur: NEGATIVE mg/dL
LEUKOCYTES UA: NEGATIVE
NITRITE: NEGATIVE
PH: 7 (ref 5.0–8.0)
Protein, ur: NEGATIVE mg/dL
SPECIFIC GRAVITY, URINE: 1.02 (ref 1.005–1.030)
Urobilinogen, UA: 1 mg/dL (ref 0.0–1.0)

## 2015-07-20 LAB — CBC
HCT: 38.2 % (ref 36.0–46.0)
Hemoglobin: 13.1 g/dL (ref 12.0–15.0)
MCH: 28.3 pg (ref 26.0–34.0)
MCHC: 34.3 g/dL (ref 30.0–36.0)
MCV: 82.5 fL (ref 78.0–100.0)
Platelets: 217 10*3/uL (ref 150–400)
RBC: 4.63 MIL/uL (ref 3.87–5.11)
RDW: 14 % (ref 11.5–15.5)
WBC: 8.4 10*3/uL (ref 4.0–10.5)

## 2015-07-20 LAB — WET PREP, GENITAL
Clue Cells Wet Prep HPF POC: NONE SEEN
Trich, Wet Prep: NONE SEEN
Yeast Wet Prep HPF POC: NONE SEEN

## 2015-07-20 LAB — URINE MICROSCOPIC-ADD ON

## 2015-07-20 LAB — TYPE AND SCREEN
ABO/RH(D): O POS
Antibody Screen: NEGATIVE

## 2015-07-20 SURGERY — DILATION AND EVACUATION, UTERUS
Anesthesia: General | Site: Uterus

## 2015-07-20 MED ORDER — FENTANYL CITRATE (PF) 100 MCG/2ML IJ SOLN
INTRAMUSCULAR | Status: DC | PRN
  Administered 2015-07-20: 50 ug via INTRAVENOUS
  Administered 2015-07-20: 100 ug via INTRAVENOUS
  Administered 2015-07-20: 50 ug via INTRAVENOUS

## 2015-07-20 MED ORDER — FAMOTIDINE IN NACL 20-0.9 MG/50ML-% IV SOLN
20.0000 mg | Freq: Once | INTRAVENOUS | Status: AC
  Administered 2015-07-20: 20 mg via INTRAVENOUS
  Filled 2015-07-20: qty 50

## 2015-07-20 MED ORDER — LACTATED RINGERS IV SOLN
INTRAVENOUS | Status: DC | PRN
  Administered 2015-07-20: 21:00:00 via INTRAVENOUS

## 2015-07-20 MED ORDER — KETOROLAC TROMETHAMINE 30 MG/ML IJ SOLN
INTRAMUSCULAR | Status: DC | PRN
  Administered 2015-07-20: 30 mg via INTRAVENOUS

## 2015-07-20 MED ORDER — LIDOCAINE HCL (CARDIAC) 20 MG/ML IV SOLN
INTRAVENOUS | Status: DC | PRN
  Administered 2015-07-20: 80 mg via INTRAVENOUS

## 2015-07-20 MED ORDER — CITRIC ACID-SODIUM CITRATE 334-500 MG/5ML PO SOLN
30.0000 mL | Freq: Once | ORAL | Status: AC
  Administered 2015-07-20: 30 mL via ORAL
  Filled 2015-07-20: qty 15

## 2015-07-20 MED ORDER — HYDROCODONE-ACETAMINOPHEN 7.5-325 MG PO TABS: 1.0000 | ORAL_TABLET | Freq: Once | ORAL | Status: DC | PRN

## 2015-07-20 MED ORDER — DEXAMETHASONE SODIUM PHOSPHATE 10 MG/ML IJ SOLN
INTRAMUSCULAR | Status: DC | PRN
  Administered 2015-07-20: 4 mg via INTRAVENOUS

## 2015-07-20 MED ORDER — METOCLOPRAMIDE HCL 5 MG/ML IJ SOLN: 10.0000 mg | Freq: Once | INTRAMUSCULAR | Status: DC | PRN

## 2015-07-20 MED ORDER — BUTORPHANOL TARTRATE 1 MG/ML IJ SOLN
1.0000 mg | Freq: Once | INTRAMUSCULAR | Status: AC
  Administered 2015-07-20: 1 mg via INTRAVENOUS
  Filled 2015-07-20: qty 1

## 2015-07-20 MED ORDER — MIDAZOLAM HCL 2 MG/2ML IJ SOLN
INTRAMUSCULAR | Status: DC | PRN
  Administered 2015-07-20: 2 mg via INTRAVENOUS

## 2015-07-20 MED ORDER — SODIUM CHLORIDE 0.9 % IV SOLN
INTRAVENOUS | Status: DC
  Administered 2015-07-20: 20:00:00 via INTRAVENOUS

## 2015-07-20 MED ORDER — PROPOFOL 10 MG/ML IV BOLUS
INTRAVENOUS | Status: DC | PRN
  Administered 2015-07-20: 170 mg via INTRAVENOUS

## 2015-07-20 MED ORDER — METHYLERGONOVINE MALEATE 0.2 MG/ML IJ SOLN
INTRAMUSCULAR | Status: DC | PRN
  Administered 2015-07-20: 0.2 mg via INTRAMUSCULAR

## 2015-07-20 MED ORDER — MEPERIDINE HCL 25 MG/ML IJ SOLN: 6.2500 mg | INTRAMUSCULAR | Status: DC | PRN

## 2015-07-20 MED ORDER — FENTANYL CITRATE (PF) 100 MCG/2ML IJ SOLN
INTRAMUSCULAR | Status: AC
  Filled 2015-07-20: qty 4

## 2015-07-20 MED ORDER — ONDANSETRON HCL 4 MG/2ML IJ SOLN
INTRAMUSCULAR | Status: DC | PRN
  Administered 2015-07-20: 4 mg via INTRAVENOUS

## 2015-07-20 MED ORDER — MIDAZOLAM HCL 2 MG/2ML IJ SOLN
INTRAMUSCULAR | Status: AC
  Filled 2015-07-20: qty 4

## 2015-07-20 MED ORDER — FENTANYL CITRATE (PF) 100 MCG/2ML IJ SOLN: 25.0000 ug | INTRAMUSCULAR | Status: DC | PRN

## 2015-07-20 SURGICAL SUPPLY — 18 items
CATH ROBINSON RED A/P 16FR (CATHETERS) ×3 IMPLANT
CLOTH BEACON ORANGE TIMEOUT ST (SAFETY) ×3 IMPLANT
DECANTER SPIKE VIAL GLASS SM (MISCELLANEOUS) ×3 IMPLANT
GLOVE BIO SURGEON STRL SZ7 (GLOVE) ×3 IMPLANT
GLOVE BIOGEL PI IND STRL 7.0 (GLOVE) ×1 IMPLANT
GLOVE BIOGEL PI INDICATOR 7.0 (GLOVE) ×2
GOWN STRL REUS W/TWL LRG LVL3 (GOWN DISPOSABLE) ×6 IMPLANT
KIT BERKELEY 1ST TRIMESTER 3/8 (MISCELLANEOUS) ×3 IMPLANT
NS IRRIG 1000ML POUR BTL (IV SOLUTION) ×3 IMPLANT
PACK VAGINAL MINOR WOMEN LF (CUSTOM PROCEDURE TRAY) ×3 IMPLANT
PAD OB MATERNITY 4.3X12.25 (PERSONAL CARE ITEMS) ×3 IMPLANT
PAD PREP 24X48 CUFFED NSTRL (MISCELLANEOUS) ×3 IMPLANT
SET BERKELEY SUCTION TUBING (SUCTIONS) ×3 IMPLANT
TOWEL OR 17X24 6PK STRL BLUE (TOWEL DISPOSABLE) ×6 IMPLANT
VACURETTE 10 RIGID CVD (CANNULA) IMPLANT
VACURETTE 7MM CVD STRL WRAP (CANNULA) IMPLANT
VACURETTE 8 RIGID CVD (CANNULA) IMPLANT
VACURETTE 9 RIGID CVD (CANNULA) ×3 IMPLANT

## 2015-07-20 NOTE — Transfer of Care (Signed)
Immediate Anesthesia Transfer of Care Note  Patient: Anne Murillo  Procedure(s) Performed: Procedure(s): DILATATION AND EVACUATION (N/A)  Patient Location: PACU  Anesthesia Type:General  Level of Consciousness: awake, alert  and oriented  Airway & Oxygen Therapy: Patient Spontanous Breathing and Patient connected to nasal cannula oxygen  Post-op Assessment: Report given to RN and Post -op Vital signs reviewed and stable  Post vital signs: Reviewed and stable  Last Vitals:  Filed Vitals:   07/20/15 2115  BP: 118/73  Pulse: 60  Temp:   Resp:     Complications: No apparent anesthesia complications

## 2015-07-20 NOTE — Op Note (Signed)
07/20/2015  9:51 PM  PATIENT:  Anne Murillo  29 y.o. female  PRE-OPERATIVE DIAGNOSIS:  D&E  POST-OPERATIVE DIAGNOSIS:  D&E  PROCEDURE:  Procedure(s): DILATATION AND EVACUATION (N/A)  SURGEON:  Surgeon(s) and Role:    * Bobbye Charleston, MD - Primary   ANESTHESIA:   general  EBL:  Total I/O In: -  Out: 300 [Urine:200; Blood:100]   LOCAL MEDICATIONS USED:  NONE  SPECIMEN:  No Specimen  DISPOSITION OF SPECIMEN:  PATHOLOGY  COUNTS:  YES  TOURNIQUET:  * No tourniquets in log *  DICTATION: .Dragon Dictation  PLAN OF CARE: Admit to inpatient   PATIENT DISPOSITION:  PACU - hemodynamically stable.   Delay start of Pharmacological VTE agent (>24hrs) due to surgical blood loss or risk of bleeding: not applicable  Medications: Methergine  Complications: None  Findings:  9 week size uterus to 8 size post procedure.  Good crie was achieved.  After adequate anesthesia was achieved, the patient was prepped and draped in the usual sterile fashion.  The speculum was placed in the vagina and the cervix stabilized with a single-tooth tenaculum.  The cervix was dilated with [name] dilators and the 9 mm curette was used to remove contents of the uterus.  Alternating sharp curettage with a curette and suction curettage was performed until all contents were removed and good crie was achieved.  All instruments were removed from the vagina.  The patient tolerated the procedure well.    Londyn Wotton A

## 2015-07-20 NOTE — MAU Note (Signed)
Patient called out to desk for assistance. Upon entering room, patient has serosanguinous fluid flowing from vagina. Provider notified.

## 2015-07-20 NOTE — Discharge Instructions (Signed)
DISCHARGE INSTRUCTIONS: D&C / D&E The following instructions have been prepared to help you care for yourself upon your return home.   May take Ibuprofen after 3:50 A.M.   Personal hygiene:  Use sanitary pads for vaginal drainage, not tampons.  Shower the day after your procedure.  NO tub baths, pools or Jacuzzis for 2-3 weeks.  Wipe front to back after using the bathroom.  Activity and limitations:  Do NOT drive or operate any equipment for 24 hours. The effects of anesthesia are still present and drowsiness may result.  Do NOT rest in bed all day.  Walking is encouraged.  Walk up and down stairs slowly.  You may resume your normal activity in one to two days or as indicated by your physician.  Sexual activity: NO intercourse for at least 2 weeks after the procedure, or as indicated by your physician.  Diet: Eat a light meal as desired this evening. You may resume your usual diet tomorrow.  Return to work: You may resume your work activities in one to two days or as indicated by your doctor.  What to expect after your surgery: Expect to have vaginal bleeding/discharge for 2-3 days and spotting for up to 10 days. It is not unusual to have soreness for up to 1-2 weeks. You may have a slight burning sensation when you urinate for the first day. Mild cramps may continue for a couple of days. You may have a regular period in 2-6 weeks.  Call your doctor for any of the following:  Excessive vaginal bleeding, saturating and changing one pad every hour.  Inability to urinate 6 hours after discharge from hospital.  Pain not relieved by pain medication.  Fever of 100.4 F or greater.  Unusual vaginal discharge or odor.

## 2015-07-20 NOTE — Anesthesia Preprocedure Evaluation (Addendum)
Anesthesia Evaluation  Patient identified by MRN, date of birth, ID band Patient awake    Reviewed: Allergy & Precautions, NPO status , Patient's Chart, lab work & pertinent test results  Airway Mallampati: III  TM Distance: >3 FB Neck ROM: Full    Dental no notable dental hx. (+) Teeth Intact   Pulmonary neg pulmonary ROS,    Pulmonary exam normal breath sounds clear to auscultation       Cardiovascular hypertension, Normal cardiovascular exam Rhythm:Regular Rate:Normal     Neuro/Psych negative neurological ROS  negative psych ROS   GI/Hepatic negative GI ROS, Neg liver ROS,   Endo/Other  Obesity  Renal/GU negative Renal ROS  negative genitourinary   Musculoskeletal negative musculoskeletal ROS (+)   Abdominal   Peds  Hematology negative hematology ROS (+)   Anesthesia Other Findings   Reproductive/Obstetrics (+) Pregnancy 11 weeks incomplete Ab                            Anesthesia Physical Anesthesia Plan  ASA: II and emergent  Anesthesia Plan: General   Post-op Pain Management:    Induction: Intravenous  Airway Management Planned: LMA  Additional Equipment:   Intra-op Plan:   Post-operative Plan: Extubation in OR  Informed Consent: I have reviewed the patients History and Physical, chart, labs and discussed the procedure including the risks, benefits and alternatives for the proposed anesthesia with the patient or authorized representative who has indicated his/her understanding and acceptance.   Dental advisory given  Plan Discussed with: CRNA, Anesthesiologist and Surgeon  Anesthesia Plan Comments:         Anesthesia Quick Evaluation

## 2015-07-20 NOTE — MAU Note (Signed)
Permit signed. Dr Royce Macadamia in to see pt. Pt given new gown and pad.

## 2015-07-20 NOTE — Anesthesia Procedure Notes (Signed)
Procedure Name: LMA Insertion Date/Time: 07/20/2015 9:34 PM Performed by: Harrie Cazarez, Sheron Nightingale Pre-anesthesia Checklist: Patient identified, Patient being monitored, Timeout performed, Emergency Drugs available and Suction available Patient Re-evaluated:Patient Re-evaluated prior to inductionOxygen Delivery Method: Circle system utilized, Nasal cannula, Simple face mask, Ambu bag and Non-rebreather mask Preoxygenation: Pre-oxygenation with 100% oxygen Intubation Type: IV induction LMA: LMA inserted LMA Size: 4.0 Number of attempts: 1 Placement Confirmation: positive ETCO2 and breath sounds checked- equal and bilateral Dental Injury: Teeth and Oropharynx as per pre-operative assessment

## 2015-07-20 NOTE — MAU Provider Note (Signed)
  History     CSN: [id number]  Arrival date and time: 07/20/15 1721   First Provider Initiated Contact with Patient 07/20/15 1800      Chief Complaint  Patient presents with  . Abdominal Pain   HPI  Anne Murillo is a 29 y.o. G2P1001 at [redacted]w[redacted]d. She presents with bleeding since yesterday. It is bright red, occ clots, mostly on tissue wiping, occ in panties. She is having cramping. She has urinary frequency, no urgency or dysuria.  OB History    Gravida Para Term Preterm AB TAB SAB Ectopic Multiple Living   2 1 1       1       Past Medical History  Diagnosis Date  . Medical history non-contributory     Past Surgical History  Procedure Laterality Date  . Cesarean section N/A 12/05/2013    Procedure: Primary Cesarean Section Delivery Baby Girl @ 0136, Apgars 9/9;  Surgeon: Osborne Oman, MD;  Location: [facility];  Service: Obstetrics;  Laterality: N/A;    History reviewed. No pertinent family history.  Social History  Substance Use Topics  . Smoking status: Never Smoker   . Smokeless tobacco: Never Used  . Alcohol Use: No    Allergies: No Known Allergies  Prescriptions prior to admission  Medication Sig Dispense Refill Last Dose  . Prenatal Vit-Fe Fumarate-FA (PRENATAL VITAMINS) 28-0.8 MG TABS Take 1 tablet by mouth daily. 30 tablet 11     Review of Systems  Constitutional: Negative for fever and chills.  Gastrointestinal: Positive for abdominal pain. Negative for nausea, vomiting, diarrhea and constipation.  Genitourinary: Positive for frequency. Negative for dysuria and urgency.       +bleeding   Physical Exam   Blood pressure 110/66, pulse 70, temperature 98.3 F (36.8 C), temperature source Oral, resp. rate 16, last menstrual period 03/24/2015, currently breastfeeding.  Physical Exam  Nursing note and vitals reviewed. Constitutional: She is oriented to person, place, and time. She appears well-developed and well-nourished.  Cardiovascular: Normal rate.    Respiratory: Effort normal.  GI: Soft. There is no tenderness.  Genitourinary:  Pelvic exam: Ext gen- nl anatomy, skin intact Vagina- small amt mucoid bloody discharge cleaned with 3 swabs Cx- closed Uterus-nontender, ~10 wk size Adn- non tender, no masses palp  Musculoskeletal: Normal range of motion.  Neurological: She is alert and oriented to person, place, and time.  Skin: Skin is warm and dry.  Psychiatric: She has a normal mood and affect. Her behavior is normal.    MAU Course  Procedures  MDM U/S revealed 8 4/7 wks CRL , no cardiac activity. Results discussed with the pt and her husband. Options reviewed, they would like to go to OR for definitive tx. Last ate/drank at 1pm. Dr Philis Pique informed of pt status, she will check with OR for time. Start IV, may give Stadol 1 mg IV after consent signed.    Assessment and Plan  Missed abortion at 8 4/7 wks To OR for D&E by Dr Dalbert Batman, Airyanna Dipalma M. 07/20/2015, 6:13 PM

## 2015-07-20 NOTE — Brief Op Note (Signed)
07/20/2015  9:51 PM  PATIENT:  Anne Murillo  29 y.o. female  PRE-OPERATIVE DIAGNOSIS:  D&E  POST-OPERATIVE DIAGNOSIS:  D&E  PROCEDURE:  Procedure(s): DILATATION AND EVACUATION (N/A)  SURGEON:  Surgeon(s) and Role:    * Bobbye Charleston, MD - Primary   ANESTHESIA:   general  EBL:  Total I/O In: -  Out: 300 [Urine:200; Blood:100]   LOCAL MEDICATIONS USED:  NONE  SPECIMEN:  No Specimen  DISPOSITION OF SPECIMEN:  PATHOLOGY  COUNTS:  YES  TOURNIQUET:  * No tourniquets in log *  DICTATION: .Dragon Dictation  PLAN OF CARE: Admit to inpatient   PATIENT DISPOSITION:  PACU - hemodynamically stable.   Delay start of Pharmacological VTE agent (>24hrs) due to surgical blood loss or risk of bleeding: not applicable  Medications: Methergine  Complications: None  Findings:  9 week size uterus to 8 size post procedure.  Good crie was achieved.  After adequate anesthesia was achieved, the patient was prepped and draped in the usual sterile fashion.  The speculum was placed in the vagina and the cervix stabilized with a single-tooth tenaculum.  The cervix was dilated with [name] dilators and the 9 mm curette was used to remove contents of the uterus.  Alternating sharp curettage with a curette and suction curettage was performed until all contents were removed and good crie was achieved.  All instruments were removed from the vagina.  The patient tolerated the procedure well.    Anne Murillo A

## 2015-07-20 NOTE — MAU Note (Signed)
Patient presents at [redacted] weeks gestation with c/o lower abdominal pain since 2100 last night. Also states she is seeing blood in her urine.

## 2015-07-20 NOTE — Progress Notes (Signed)
To or 

## 2015-07-20 NOTE — H&P (Signed)
Please see full H&P by NP.  Briefly pt is [name] by [place] at 8 weeks today.  Cervix is closed per np but pt very uncomfortable and desires d&E.  All R/B./Alt d/w pt and she desires to proceed.  Blood type is O+.  Anne Murillo is a 29 y.o. G2P1001 at [redacted]w[redacted]d. She presents with bleeding since yesterday. It is bright red, occ clots, mostly on tissue wiping, occ in panties. She is having cramping. She has urinary frequency, no urgency or dysuria.  OB History    Gravida Para Term Preterm AB TAB SAB Ectopic Multiple Living   2 1 1       1       Past Medical History  Diagnosis Date  . Medical history non-contributory     Past Surgical History  Procedure Laterality Date  . Cesarean section N/A 12/05/2013    Procedure: Primary Cesarean Section Delivery Baby Girl @ 0136, Apgars 9/9; Surgeon: Osborne Oman, MD; Location: [facility]; Service: Obstetrics; Laterality: N/A;    History reviewed. No pertinent family history.  Social History  Substance Use Topics  . Smoking status: Never Smoker   . Smokeless tobacco: Never Used  . Alcohol Use: No    Allergies: No Known Allergies  Prescriptions prior to admission  Medication Sig Dispense Refill Last Dose  . Prenatal Vit-Fe Fumarate-FA (PRENATAL VITAMINS) 28-0.8 MG TABS Take 1 tablet by mouth daily. 30 tablet 11     Review of Systems  Constitutional: Negative for fever and chills.  Gastrointestinal: Positive for abdominal pain. Negative for nausea, vomiting, diarrhea and constipation.  Genitourinary: Positive for frequency. Negative for dysuria and urgency.   +bleeding   Physical Exam   Blood pressure 110/66, pulse 70, temperature 98.3 F (36.8 C), temperature source Oral, resp. rate 16, last menstrual period 03/24/2015, currently breastfeeding.  Physical Exam  Nursing note and vitals reviewed. Constitutional: She is oriented to person, place, and time. She  appears well-developed and well-nourished.  Cardiovascular: Normal rate.  Respiratory: Effort normal.  GI: Soft. There is no tenderness.  Genitourinary:  Pelvic exam: Ext gen- nl anatomy, skin intact Vagina- small amt mucoid bloody discharge cleaned with 3 swabs Cx- closed Uterus-nontender, ~10 wk size Adn- non tender, no masses palp  Musculoskeletal: Normal range of motion.  Neurological: She is alert and oriented to person, place, and time.  Skin: Skin is warm and dry.  Psychiatric: She has a normal mood and affect. Her behavior is normal.    MAU Course  Procedures  MDM U/S revealed 8 4/7 wks CRL , no cardiac activity. Results discussed with the pt and her husband. Options reviewed, they would like to go to OR for definitive tx. Last ate/drank at 1pm. Dr Philis Pique informed of pt status, she will check with OR for time. Start IV, may give Stadol 1 mg IV after consent signed.    Assessment and Plan  Missed abortion at 8 4/7 wks To OR for D&E by Dr Philis Pique

## 2015-07-20 NOTE — Anesthesia Postprocedure Evaluation (Signed)
  Anesthesia Post-op Note  Patient: Anne Murillo  Procedure(s) Performed: Procedure(s): DILATATION AND EVACUATION (N/A)  Patient Location: PACU  Anesthesia Type:General  Level of Consciousness: awake, alert  and oriented  Airway and Oxygen Therapy: Patient Spontanous Breathing  Post-op Pain: none  Post-op Assessment: Post-op Vital signs reviewed, Patient's Cardiovascular Status Stable, Respiratory Function Stable, Patent Airway, No signs of Nausea or vomiting and Pain level controlled              Post-op Vital Signs: Reviewed and stable  Last Vitals:  Filed Vitals:   07/20/15 2200  BP: 112/72  Pulse: 89  Temp: 36.9 C  Resp: 16    Complications: No apparent anesthesia complications

## 2015-07-21 LAB — ABO/RH: ABO/RH(D): O POS

## 2015-07-22 ENCOUNTER — Encounter (HOSPITAL_COMMUNITY): Payer: Self-pay | Admitting: Obstetrics and Gynecology

## 2015-07-22 ENCOUNTER — Telehealth: Payer: Self-pay | Admitting: Medical

## 2015-07-22 LAB — GC/CHLAMYDIA PROBE AMP (~~LOC~~) NOT AT ARMC
Chlamydia: NEGATIVE
Neisseria Gonorrhea: NEGATIVE

## 2015-07-22 NOTE — Telephone Encounter (Signed)
Can you please call or send card to the family.  She had a miscarriage.  Please let them know we are sorry for their loss.

## 2015-11-26 ENCOUNTER — Ambulatory Visit (INDEPENDENT_AMBULATORY_CARE_PROVIDER_SITE_OTHER): Payer: BLUE CROSS/BLUE SHIELD | Admitting: Medical

## 2015-11-26 ENCOUNTER — Encounter: Payer: Self-pay | Admitting: Medical

## 2015-11-26 VITALS — BP 118/80 | HR 84 | Wt 187.0 lb

## 2015-11-26 DIAGNOSIS — N926 Irregular menstruation, unspecified: Secondary | ICD-10-CM

## 2015-11-26 DIAGNOSIS — R1030 Lower abdominal pain, unspecified: Secondary | ICD-10-CM | POA: Diagnosis not present

## 2015-11-26 DIAGNOSIS — K5900 Constipation, unspecified: Secondary | ICD-10-CM

## 2015-11-26 DIAGNOSIS — Z3201 Encounter for pregnancy test, result positive: Secondary | ICD-10-CM

## 2015-11-26 DIAGNOSIS — G4489 Other headache syndrome: Secondary | ICD-10-CM

## 2015-11-26 LAB — POCT URINALYSIS DIPSTICK
BILIRUBIN UA: NEGATIVE
Blood, UA: NEGATIVE
GLUCOSE UA: NEGATIVE
KETONES UA: NEGATIVE
Leukocytes, UA: NEGATIVE
Nitrite, UA: NEGATIVE
SPEC GRAV UA: 1.025
UROBILINOGEN UA: NEGATIVE
pH, UA: 6

## 2015-11-26 LAB — POCT URINE PREGNANCY: Preg Test, Ur: POSITIVE — AB

## 2015-11-26 MED ORDER — PRENATAL VITAMINS 28-0.8 MG PO TABS: 1.0000 | ORAL_TABLET | Freq: Every day | ORAL | Status: DC

## 2015-11-26 NOTE — Progress Notes (Signed)
Subjective: Chief Complaint  Patient presents with  . pregancy test    had miscarriage in november. positive pregnancy test in office today   Here with husband who translates.  LMP was 10/20/15.  They are trying to get pregnant, wants pregnancy test today.    She ended up having miscarriage and D&C back in 07/2015.    She does have some nausea, breast tenderness.   Has hx/o 2 prior pregnancies, 1 live birth, 1 miscarriage.  She has some headache started yesterday.   Has some lower abdominal pain, gets some constipation, no burning with urination, no urinary frequency or urgency, no back pain, no blood in urine or stool. No fever. No other aggravating or relieving factors. No other complaint.  Past Medical History  Diagnosis Date  . Medical history non-contributory    ROS as in subjective   Objective: BP 118/80 mmHg  Pulse 84  Wt 187 lb (84.823 kg)  LMP 10/20/2015  Breastfeeding? No  General appearance: alert, no distress, WD/WN HEENT: normocephalic, sclerae anicteric, TMs pearly, nares patent, no discharge or erythema, pharynx normal Oral cavity: MMM, no lesions Neck: supple, no lymphadenopathy, no thyromegaly, no masses Heart: RRR, normal S1, S2, no murmurs Lungs: CTA bilaterally, no wheezes, rhonchi, or rales Abdomen: +bs, soft, tender along lower abdomen generalized, non distended, no masses, no hepatomegaly, no splenomegaly Pulses: 2+ symmetric, upper and lower extremities, normal cap refill No extremity edema    Assessment: Encounter Diagnoses  Name Primary?  . Missed period Yes  . Pregnancy test positive   . Lower abdominal pain   . Constipation, unspecified constipation type   . Headache syndrome      Plan: +pregnancy test today.  Will check hcG quant.  Of note she had miscarriage back in 07/2015, had to have D&C.  They are actively trying to get pregnant.  Begin prenatal vitamins.  UA reviewed today.  She has lower abdominal tenderness, but has constipation as  well.  discussed hydration, fiber intake.   Can use Colace and Tylenol OTC prn.   If worse or not improving in the next few days let me know regarding abdominal pain.  F/u pending lab.   Anne Murillo was seen today for pregancy test.  Diagnoses and all orders for this visit:  Missed period -     POCT urine pregnancy -     hCG, quantitative, pregnancy  Pregnancy test positive -     hCG, quantitative, pregnancy  Lower abdominal pain -     POCT urinalysis dipstick  Constipation, unspecified constipation type  Headache syndrome  Other orders -     Prenatal Vit-Fe Fumarate-FA (PRENATAL VITAMINS) 28-0.8 MG TABS; Take 1 tablet by mouth daily.

## 2015-11-26 NOTE — Patient Instructions (Signed)
Encounter Diagnoses  Name Primary?  . Missed period Yes  . Pregnancy test positive   . Lower abdominal pain   . Constipation, unspecified constipation type   . Headache syndrome    Begin prenatal vitamin daily We will call with lab results and likely referral to Dr. Otis Dials can use Tylenol OTC for headache/pain You can use Colace OTC as a stool softener if needed for constipation Get plenty of fiber and water in the diet daily The urine today does not show infection If the abdominal pain worsens in the next few days , then recheck

## 2015-11-27 LAB — HCG, QUANTITATIVE, PREGNANCY: hCG, Beta Chain, Quant, S: 30297.9 m[IU]/mL — ABNORMAL HIGH

## 2015-12-23 ENCOUNTER — Inpatient Hospital Stay (HOSPITAL_COMMUNITY)
Admission: AD | Admit: 2015-12-23 | Discharge: 2015-12-23 | Disposition: A | Discharge status: Home / Self Care | Payer: Medicaid Other | Source: Ambulatory Visit | Attending: Family Medicine | Admitting: Family Medicine

## 2015-12-23 ENCOUNTER — Inpatient Hospital Stay (HOSPITAL_COMMUNITY): Payer: Medicaid Other

## 2015-12-23 ENCOUNTER — Encounter (HOSPITAL_COMMUNITY): Payer: Self-pay | Admitting: *Deleted

## 2015-12-23 DIAGNOSIS — Z3A09 9 weeks gestation of pregnancy: Secondary | ICD-10-CM | POA: Insufficient documentation

## 2015-12-23 DIAGNOSIS — R102 Pelvic and perineal pain: Secondary | ICD-10-CM

## 2015-12-23 DIAGNOSIS — Z3491 Encounter for supervision of normal pregnancy, unspecified, first trimester: Secondary | ICD-10-CM

## 2015-12-23 DIAGNOSIS — O26891 Other specified pregnancy related conditions, first trimester: Secondary | ICD-10-CM | POA: Insufficient documentation

## 2015-12-23 DIAGNOSIS — K5900 Constipation, unspecified: Secondary | ICD-10-CM

## 2015-12-23 DIAGNOSIS — O3481 Maternal care for other abnormalities of pelvic organs, first trimester: Secondary | ICD-10-CM

## 2015-12-23 DIAGNOSIS — N83202 Unspecified ovarian cyst, left side: Secondary | ICD-10-CM | POA: Diagnosis not present

## 2015-12-23 DIAGNOSIS — N83209 Unspecified ovarian cyst, unspecified side: Secondary | ICD-10-CM

## 2015-12-23 DIAGNOSIS — O218 Other vomiting complicating pregnancy: Secondary | ICD-10-CM | POA: Diagnosis not present

## 2015-12-23 DIAGNOSIS — O219 Vomiting of pregnancy, unspecified: Secondary | ICD-10-CM

## 2015-12-23 LAB — CBC
HCT: 36.9 % (ref 36.0–46.0)
HEMOGLOBIN: 12.6 g/dL (ref 12.0–15.0)
MCH: 27.9 pg (ref 26.0–34.0)
MCHC: 34.1 g/dL (ref 30.0–36.0)
MCV: 81.8 fL (ref 78.0–100.0)
Platelets: 210 10*3/uL (ref 150–400)
RBC: 4.51 MIL/uL (ref 3.87–5.11)
RDW: 14 % (ref 11.5–15.5)
WBC: 6.4 10*3/uL (ref 4.0–10.5)

## 2015-12-23 LAB — URINALYSIS, ROUTINE W REFLEX MICROSCOPIC
BILIRUBIN URINE: NEGATIVE
Glucose, UA: 250 mg/dL — AB
Hgb urine dipstick: NEGATIVE
Ketones, ur: 15 mg/dL — AB
Leukocytes, UA: NEGATIVE
NITRITE: NEGATIVE
PROTEIN: NEGATIVE mg/dL
SPECIFIC GRAVITY, URINE: 1.025 (ref 1.005–1.030)
pH: 5.5 (ref 5.0–8.0)

## 2015-12-23 LAB — WET PREP, GENITAL
CLUE CELLS WET PREP: NONE SEEN
Sperm: NONE SEEN
Trich, Wet Prep: NONE SEEN
Yeast Wet Prep HPF POC: NONE SEEN

## 2015-12-23 LAB — HCG, QUANTITATIVE, PREGNANCY: HCG, BETA CHAIN, QUANT, S: 167495 m[IU]/mL — AB (ref <5)

## 2015-12-23 MED ORDER — GLYCOPYRROLATE 1 MG PO TABS
2.0000 mg | ORAL_TABLET | Freq: Once | ORAL | Status: AC
  Administered 2015-12-23: 2 mg via ORAL
  Filled 2015-12-23: qty 2

## 2015-12-23 MED ORDER — POLYETHYLENE GLYCOL 3350 17 GM/SCOOP PO POWD: 17.0000 g | Freq: Every day | ORAL | Status: DC

## 2015-12-23 MED ORDER — GLYCOPYRROLATE 2 MG PO TABS: 2.0000 mg | ORAL_TABLET | Freq: Three times a day (TID) | ORAL | Status: DC | PRN

## 2015-12-23 MED ORDER — PROMETHAZINE HCL 25 MG PO TABS
25.0000 mg | ORAL_TABLET | Freq: Once | ORAL | Status: AC
  Administered 2015-12-23: 25 mg via ORAL
  Filled 2015-12-23: qty 1

## 2015-12-23 MED ORDER — DOCUSATE SODIUM 100 MG PO CAPS: 100.0000 mg | ORAL_CAPSULE | Freq: Two times a day (BID) | ORAL | Status: DC | PRN

## 2015-12-23 MED ORDER — PROMETHAZINE HCL 25 MG PO TABS: 12.5000 mg | ORAL_TABLET | Freq: Four times a day (QID) | ORAL | Status: DC | PRN

## 2015-12-23 MED ORDER — RANITIDINE HCL 150 MG PO TABS: 150.0000 mg | ORAL_TABLET | Freq: Two times a day (BID) | ORAL | Status: DC

## 2015-12-23 MED ORDER — GI COCKTAIL ~~LOC~~
30.0000 mL | Freq: Once | ORAL | Status: AC
  Administered 2015-12-23: 30 mL via ORAL
  Filled 2015-12-23: qty 30

## 2015-12-23 NOTE — MAU Note (Signed)
Abdominal pain/cramping X 2 weeks, but really worse since yesterday. No bleeding.

## 2015-12-23 NOTE — MAU Provider Note (Signed)
Chief Complaint: Abdominal Pain   First Provider Initiated Contact with Patient 12/23/15 [phone]      SUBJECTIVE HPI: Anne Murillo is a 30 y.o. G3P1001 at [redacted]w[redacted]d by LMP who presents to maternity admissions reporting cramping intermittent abdominal pain in her lower abdomen, mostly on the left side, and burning pain in her upper abdomen that is constant.  She has not tried any treatments.  She does report constipation with last bowel movement yesterday that was hard and uncomfortable. She has not used any treatments recently for constipation.  Her pain started 3 days ago and is worsening.  She also reports daily nausea with occasional vomiting and excess saliva which is causing her nausea to be worse.  She does not have any medications at home for nausea, and has tried small frequent bland meals and it is not helping.  She denies vaginal bleeding, vaginal itching/burning, urinary symptoms, h/a, dizziness, or fever/chills.     HPI  Past Medical History  Diagnosis Date  . Medical history non-contributory    Past Surgical History  Procedure Laterality Date  . Cesarean section N/A 12/05/2013    Procedure: Primary Cesarean Section Delivery Baby Girl @ 0136, Apgars 9/9;  Surgeon: Osborne Oman, MD;  Location: [facility];  Service: Obstetrics;  Laterality: N/A;  . Dilation and evacuation N/A 07/20/2015    Procedure: DILATATION AND EVACUATION;  Surgeon: Bobbye Charleston, MD;  Location: [facility];  Service: Gynecology;  Laterality: N/A;   Social History   Social History  . Marital Status: Married    Spouse Name: N/A  . Number of Children: N/A  . Years of Education: N/A   Occupational History  . Not on file.   Social History Main Topics  . Smoking status: Never Smoker   . Smokeless tobacco: Never Used  . Alcohol Use: No  . Drug Use: No  . Sexual Activity: Not Currently    Birth Control/ Protection: None   Other Topics Concern  . Not on file   Social History Narrative   No current  facility-administered medications on file prior to encounter.   Current Outpatient Prescriptions on File Prior to Encounter  Medication Sig Dispense Refill  . Prenatal Vit-Fe Fumarate-FA (PRENATAL VITAMINS) 28-0.8 MG TABS Take 1 tablet by mouth daily. 30 tablet 11   No Known Allergies  ROS:  Review of Systems  Constitutional: Negative for fever, chills and fatigue.  Respiratory: Negative for shortness of breath.   Cardiovascular: Negative for chest pain.  Gastrointestinal: Positive for nausea, vomiting and abdominal pain.  Genitourinary: Positive for pelvic pain. Negative for dysuria, flank pain, vaginal bleeding, vaginal discharge, difficulty urinating and vaginal pain.  Neurological: Negative for dizziness and headaches.  Psychiatric/Behavioral: Negative.      I have reviewed patient's Past Medical Hx, Surgical Hx, Family Hx, Social Hx, medications and allergies.   Physical Exam  Patient Vitals for the past 24 hrs:  BP Temp Temp src Pulse Resp Weight  12/23/15 1245 125/82 mmHg - - 66 16 -  12/23/15 0915 130/80 mmHg 98.2 F (36.8 C) Oral 105 18 -  12/23/15 0912 - - - - - 185 lb (83.915 kg)   Constitutional: Well-developed, well-nourished female in no acute distress.  Cardiovascular: normal rate Respiratory: normal effort GI: Abd soft, non-tender. Pos BS x 4 MS: Extremities nontender, no edema, normal ROM Neurologic: Alert and oriented x 4.  GU: Neg CVAT.  PELVIC EXAM: Cervix pink, visually closed, without lesion, scant white creamy discharge, vaginal walls and external  genitalia normal Bimanual exam: Cervix 0/long/high, firm, anterior, neg CMT, uterus nontender, nonenlarged, adnexa without tenderness, enlargement, or mass   LAB RESULTS Results for orders placed or performed during the hospital encounter of 12/23/15 (from the past 24 hour(s))  Urinalysis, Routine w reflex microscopic (not at [facility])     Status: Abnormal   Collection Time: 12/23/15  9:05 AM  Result Value  Ref Range   Color, Urine YELLOW YELLOW   APPearance CLEAR CLEAR   Specific Gravity, Urine 1.025 1.005 - 1.030   pH 5.5 5.0 - 8.0   Glucose, UA 250 (A) NEGATIVE mg/dL   Hgb urine dipstick NEGATIVE NEGATIVE   Bilirubin Urine NEGATIVE NEGATIVE   Ketones, ur 15 (A) NEGATIVE mg/dL   Protein, ur NEGATIVE NEGATIVE mg/dL   Nitrite NEGATIVE NEGATIVE   Leukocytes, UA NEGATIVE NEGATIVE  Wet prep, genital     Status: Abnormal   Collection Time: 12/23/15 10:10 AM  Result Value Ref Range   Yeast Wet Prep HPF POC NONE SEEN NONE SEEN   Trich, Wet Prep NONE SEEN NONE SEEN   Clue Cells Wet Prep HPF POC NONE SEEN NONE SEEN   WBC, Wet Prep HPF POC FEW (A) NONE SEEN   Sperm NONE SEEN   CBC     Status: None   Collection Time: 12/23/15 10:18 AM  Result Value Ref Range   WBC 6.4 4.0 - 10.5 K/uL   RBC 4.51 3.87 - 5.11 MIL/uL   Hemoglobin 12.6 12.0 - 15.0 g/dL   HCT 36.9 36.0 - 46.0 %   MCV 81.8 78.0 - 100.0 fL   MCH 27.9 26.0 - 34.0 pg   MCHC 34.1 30.0 - 36.0 g/dL   RDW 14.0 11.5 - 15.5 %   Platelets 210 150 - 400 K/uL  hCG, quantitative, pregnancy     Status: Abnormal   Collection Time: 12/23/15 10:18 AM  Result Value Ref Range   hCG, Beta Chain, Quant, S 167495 (H) <5 mIU/mL    --/--/O POS, O POS (11/05 2005)  IMAGING US Ob Comp Less 14 Wks  12/23/2015  CLINICAL DATA:  Abdominal pain EXAM: OBSTETRIC <14 WK [place] AND TRANSVAGINAL OB US TECHNIQUE: Both transabdominal and transvaginal ultrasound examinations were performed for complete evaluation of the gestation as well as the maternal uterus, adnexal regions, and pelvic cul-de-sac. Transvaginal technique was performed to assess early pregnancy. COMPARISON:  None. FINDINGS: Intrauterine gestational sac: Single Yolk sac:  Yes Embryo:  Yes Cardiac Activity: Yes Heart Rate: 168  bpm CRL:  2.9 cm  9 w   5 d                  [place] EDC: 07/22/2016 Subchorionic hemorrhage:  None Maternal uterus/adnexae: Subchorionic hemorrhage: None Right ovary: Normal Left  ovary: There are 2 large cyst within the right ovary. The largest measures 6.4 x 4.0 x 6.0 cm. Other :Small posterior fibroid arising from the lower uterine segment is identified measuring 12 x 8 x 14 mm. Free fluid:  None IMPRESSION: 1. Single living intrauterine gestation. The estimated gestational age is 50 weeks and 5 days. 2. Left ovary cyst. This is almost certainly benign, but follow up ultrasound is recommended in 1 year according to the Society of Radiologists in [place] Statement (D Clovis Riley et al. Management of Asymptomatic Ovarian and Other Adnexal Cysts Imaged at [place]: Society of Radiologists in [place] Statement 2010. Radiology 256 (Sept 2010): 943-954.). 3. Small posterior uterine fibroid. Electronically Signed  By: Kerby Moors M.D.   On: 12/23/2015 11:19    MAU Management/MDM: Ordered labs and [place] and reviewed results.  IUP noted on [place] today with large left ovarian cyst, likely benign.  [facility] to [facility] as [facility] for prenatal care. Rest/heat/Tylenol for pain. Treatments in MAU included Phenergan 25 mg PO and Robinul 0.2 mg PO with improvement in nausea symptoms. Rx for Phenergan 12.5-25 mg PO, Zantac 150 mg PO BID, Robinul 0.2 mg TID PRN, Colace 100 mg BID, and Miralax 17 g dose daily. Pt stable at time of discharge.  ASSESSMENT 1. Normal IUP (intrauterine pregnancy) on prenatal ultrasound, first trimester   2. Pelvic pain affecting pregnancy in first trimester, antepartum   3. Constipation, unspecified constipation type   4. Nausea and vomiting during pregnancy prior to [redacted] weeks gestation   5. Ovarian cyst affecting pregnancy in first trimester, antepartum     PLAN Discharge home   Medication List    TAKE these medications        docusate sodium 100 MG capsule  Commonly known as:  COLACE  Take 1 capsule (100 mg total) by mouth 2 (two) times daily as needed.     glycopyrrolate 2 MG tablet  Commonly known as:  ROBINUL   Take 1 tablet (2 mg total) by mouth 3 (three) times daily as needed.     polyethylene glycol powder powder  Commonly known as:  GLYCOLAX/MIRALAX  Take 17 g by mouth daily.     Prenatal Vitamins 28-0.8 MG Tabs  Take 1 tablet by mouth daily.     promethazine 25 MG tablet  Commonly known as:  PHENERGAN  Take 0.5-1 tablets (12.5-25 mg total) by mouth every 6 (six) hours as needed for nausea.     ranitidine 150 MG tablet  Commonly known as:  ZANTAC  Take 1 tablet (150 mg total) by mouth 2 (two) times daily.           Follow-up Information    Please follow up.   Why:  Start prenatal care as soon as possible      Fatima Blank Certified Nurse-Midwife 12/23/2015  1:15 PM

## 2015-12-23 NOTE — Discharge Instructions (Signed)

## 2015-12-24 LAB — GC/CHLAMYDIA PROBE AMP (~~LOC~~) NOT AT ARMC
CHLAMYDIA, DNA PROBE: NEGATIVE
NEISSERIA GONORRHEA: NEGATIVE

## 2015-12-24 LAB — HIV ANTIBODY (ROUTINE TESTING W REFLEX): HIV Screen 4th Generation wRfx: NONREACTIVE

## 2016-01-06 ENCOUNTER — Other Ambulatory Visit (HOSPITAL_COMMUNITY): Payer: Self-pay | Admitting: Nurse Practitioner

## 2016-01-06 DIAGNOSIS — Z3A13 13 weeks gestation of pregnancy: Secondary | ICD-10-CM

## 2016-01-06 DIAGNOSIS — Z3682 Encounter for antenatal screening for nuchal translucency: Secondary | ICD-10-CM

## 2016-01-10 ENCOUNTER — Encounter (HOSPITAL_COMMUNITY): Payer: Self-pay | Admitting: Nurse Practitioner

## 2016-01-21 ENCOUNTER — Ambulatory Visit (HOSPITAL_COMMUNITY)
Admission: RE | Admit: 2016-01-21 | Discharge: 2016-01-21 | Disposition: A | Discharge status: Home / Self Care | Payer: Medicaid Other | Source: Ambulatory Visit | Attending: Nurse Practitioner | Admitting: Nurse Practitioner

## 2016-01-21 ENCOUNTER — Encounter (HOSPITAL_COMMUNITY): Payer: Self-pay

## 2016-01-21 ENCOUNTER — Other Ambulatory Visit (HOSPITAL_COMMUNITY): Payer: Self-pay | Admitting: Nurse Practitioner

## 2016-01-21 DIAGNOSIS — O34219 Maternal care for unspecified type scar from previous cesarean delivery: Secondary | ICD-10-CM | POA: Diagnosis not present

## 2016-01-21 DIAGNOSIS — Z3A13 13 weeks gestation of pregnancy: Secondary | ICD-10-CM | POA: Insufficient documentation

## 2016-01-21 DIAGNOSIS — Z36 Encounter for antenatal screening of mother: Secondary | ICD-10-CM | POA: Diagnosis present

## 2016-01-21 DIAGNOSIS — Z3682 Encounter for antenatal screening for nuchal translucency: Secondary | ICD-10-CM

## 2016-02-03 ENCOUNTER — Other Ambulatory Visit (HOSPITAL_COMMUNITY): Payer: Self-pay | Admitting: Physician Assistant

## 2016-02-03 DIAGNOSIS — Z3689 Encounter for other specified antenatal screening: Secondary | ICD-10-CM

## 2016-02-04 ENCOUNTER — Other Ambulatory Visit (HOSPITAL_COMMUNITY): Payer: Self-pay

## 2016-03-02 ENCOUNTER — Other Ambulatory Visit (HOSPITAL_COMMUNITY): Payer: Self-pay | Admitting: Physician Assistant

## 2016-03-02 ENCOUNTER — Ambulatory Visit (HOSPITAL_COMMUNITY)
Admission: RE | Admit: 2016-03-02 | Discharge: 2016-03-02 | Disposition: A | Discharge status: Home / Self Care | Payer: Medicaid Other | Source: Ambulatory Visit | Attending: Physician Assistant | Admitting: Physician Assistant

## 2016-03-02 DIAGNOSIS — Z3A19 19 weeks gestation of pregnancy: Secondary | ICD-10-CM | POA: Insufficient documentation

## 2016-03-02 DIAGNOSIS — Z3689 Encounter for other specified antenatal screening: Secondary | ICD-10-CM

## 2016-03-02 DIAGNOSIS — O34219 Maternal care for unspecified type scar from previous cesarean delivery: Secondary | ICD-10-CM | POA: Insufficient documentation

## 2016-03-02 DIAGNOSIS — Z36 Encounter for antenatal screening of mother: Secondary | ICD-10-CM | POA: Insufficient documentation

## 2016-04-09 ENCOUNTER — Inpatient Hospital Stay (HOSPITAL_COMMUNITY)
Admission: AD | Admit: 2016-04-09 | Discharge: 2016-04-09 | Disposition: A | Discharge status: Home / Self Care | Payer: Medicaid Other | Source: Ambulatory Visit | Attending: Obstetrics and Gynecology | Admitting: Obstetrics and Gynecology

## 2016-04-09 ENCOUNTER — Encounter (HOSPITAL_COMMUNITY): Payer: Self-pay | Admitting: *Deleted

## 2016-04-09 DIAGNOSIS — O9989 Other specified diseases and conditions complicating pregnancy, childbirth and the puerperium: Secondary | ICD-10-CM | POA: Diagnosis not present

## 2016-04-09 DIAGNOSIS — R109 Unspecified abdominal pain: Secondary | ICD-10-CM

## 2016-04-09 DIAGNOSIS — Z3A24 24 weeks gestation of pregnancy: Secondary | ICD-10-CM | POA: Diagnosis not present

## 2016-04-09 DIAGNOSIS — R102 Pelvic and perineal pain unspecified side: Secondary | ICD-10-CM

## 2016-04-09 DIAGNOSIS — N949 Unspecified condition associated with female genital organs and menstrual cycle: Secondary | ICD-10-CM

## 2016-04-09 DIAGNOSIS — O26892 Other specified pregnancy related conditions, second trimester: Secondary | ICD-10-CM | POA: Diagnosis not present

## 2016-04-09 DIAGNOSIS — O26899 Other specified pregnancy related conditions, unspecified trimester: Secondary | ICD-10-CM

## 2016-04-09 DIAGNOSIS — R1032 Left lower quadrant pain: Secondary | ICD-10-CM | POA: Diagnosis present

## 2016-04-09 LAB — URINALYSIS, ROUTINE W REFLEX MICROSCOPIC
BILIRUBIN URINE: NEGATIVE
Glucose, UA: 500 mg/dL — AB
HGB URINE DIPSTICK: NEGATIVE
KETONES UR: NEGATIVE mg/dL
Leukocytes, UA: NEGATIVE
NITRITE: NEGATIVE
PROTEIN: NEGATIVE mg/dL
Specific Gravity, Urine: 1.02 (ref 1.005–1.030)
pH: 6 (ref 5.0–8.0)

## 2016-04-09 NOTE — MAU Note (Signed)
Pt C/O lower abd pain x 3 days, is intermittent, doesn't feel like contractions but is crampy.  Denies bleeding or LOF.

## 2016-04-09 NOTE — Discharge Instructions (Signed)
Douleur au ligament rond Le ligament rond est un cordon de muscle et de [employer] aide  soutenir l'utrus. Cela peut devenir une source de douleur pendant la grossesse si elle devient tire ou tordue  mesure que le bb grandit. La douleur commence gnralement au deuxime trimestre de la grossesse, et elle peut aller et venir jusqu' ce que le bb soit livr. Ce n'est pas un problme grave, et cela ne cause pas de mal au bb. La douleur du ligament rond est gnralement [employer] courte, forte et [place], mais elle peut aussi tre une douleur sourde, persistante et douloureuse. La douleur se ressent [employer] ct infrieur de l'abdomen ou dans l'aine. Il commence gnralement profondment dans l'aine et se dplace vers l'extrieur de la zone de la hanche. La douleur peut se produire avec: Un changement brusque de position. Rouler au lit. Toux ou ternuements. Activit physique. INSTRUCTIONS DE Ruben Reason votre condition pour [employer]. Suivez ces tapes pour vous aider  rsoudre votre douleur: Lorsque la douleur commence, dtendez-vous. Alors essaye: S'asseoir. Flexionnez vos genoux jusqu' votre abdomen. [place] de votre ct Teaching laboratory technician votre abdomen et un autre oreiller entre vos [place]. Assis dans un bain chaud pendant 15-20 minutes ou jusqu' ce que la douleur disparaisse. Prenez les mdicaments sur ordonnance et les mdicaments d'ordonnance uniquement comme indiqu par votre fournisseur de soins de sant. Dplacez-vous lentement lorsque vous vous asseyez et vous tenez. vitez de longues promenades si elles causent de Engineer, technical sales. Arrtez ou diminuez vos activits physiques si elles causent de Engineer, technical sales. DEMANDER DES SOINS MDICAUX SI: Votre douleur ne disparat pas Engineer, building services. Vous ressentez [employer] que vous n'aviez pas auparavant. Votre mdicament ne vous aide pas. RECHERCHE SOINS MDICAUX IMMDIATE SI: Vous dveloppez une fivre ou  des frissons. Vous dveloppez des contractions utrines. Vous dveloppez un saignement vaginal. Vous dveloppez des [employer] ou des vomissements. Vous dveloppez la diarrhe. Vous avez de la douleur lorsque vous urinez.  Cette information n'est pas destine  remplacer les conseils qui vous ont t fournis par votre fournisseur de soins de sant. Assurez-vous de Counsellor de vos questions avec votre fournisseur de soins de sant.  Document sorti: 26/05/2008 Document rvis: 07/17/2012 Document rvis: 11/07/2014 Elsevier Interactive Patient Education  [employer].

## 2016-04-09 NOTE — MAU Provider Note (Signed)
History    CSN: [id number]  Arrival date and time: 04/09/16 [id number]   Chief Complaint  Patient presents with  . Abdominal Pain   HPI Anne Murillo is a G3P1001 @[redacted]w[redacted]d  who presents to maternity admissions with 3 days of abdominal cramping ("like having a period"). Pain began suddenly and comes and goes. It has gotten worse over the last 3 days. Pain is worst in LLQ. Tried tylenol without improvement. States pain is not positional. States pain is similar to pain she had early in her pregnancy.  She reports having diarrhea on Monday but denies N/V, fever and HA. No sick contacts that she's aware of. Denies chest pain, changes in vision, shortness of breath, edema. Besides the abdominal pain she reports feeling well. Denies h/o chronic disease such as diabetes or HTN.   Reports good fetal movement. Denies vaginal bleeding, discharge.    OB History    Gravida Para Term Preterm AB Living   3 1 1   1 1    SAB TAB Ectopic Multiple Live Births   1              Past Medical History:  Diagnosis Date  . Medical history non-contributory     Past Surgical History:  Procedure Laterality Date  . CESAREAN SECTION N/A 12/05/2013   Procedure: Primary Cesarean Section Delivery Baby Girl @ 0136, Apgars 9/9;  Surgeon: Osborne Oman, MD;  Location: [facility];  Service: Obstetrics;  Laterality: N/A;  . DILATION AND EVACUATION N/A 07/20/2015   Procedure: DILATATION AND EVACUATION;  Surgeon: Bobbye Charleston, MD;  Location: [facility];  Service: Gynecology;  Laterality: N/A;    History reviewed. No pertinent family history.  Social History  Substance Use Topics  . Smoking status: Never Smoker  . Smokeless tobacco: Never Used  . Alcohol use No    Allergies: No Known Allergies  Prescriptions Prior to Admission  Medication Sig Dispense Refill Last Dose  . docusate sodium (COLACE) 100 MG capsule Take 1 capsule (100 mg total) by mouth 2 (two) times daily as needed. 30 capsule 2 Taking  .  glycopyrrolate (ROBINUL) 2 MG tablet Take 1 tablet (2 mg total) by mouth 3 (three) times daily as needed. 30 tablet 3 Taking  . polyethylene glycol powder (GLYCOLAX/MIRALAX) powder Take 17 g by mouth daily. 255 g 0 Taking  . Prenatal Vit-Fe Fumarate-FA (PRENATAL VITAMINS) 28-0.8 MG TABS Take 1 tablet by mouth daily. 30 tablet 11 Taking  . promethazine (PHENERGAN) 25 MG tablet Take 0.5-1 tablets (12.5-25 mg total) by mouth every 6 (six) hours as needed for nausea. 30 tablet 2 Taking  . ranitidine (ZANTAC) 150 MG tablet Take 1 tablet (150 mg total) by mouth 2 (two) times daily. 60 tablet 2 Taking    ROS: negative except for that included in the HPI Physical Exam   Blood pressure 115/66, pulse 112, temperature 98.9 F (37.2 C), temperature source Oral, resp. rate 18, last menstrual period 10/20/2015.   Physical Exam  Constitutional: She is oriented to person, place, and time. She appears well-developed and well-nourished.  HENT:  Head: Normocephalic and atraumatic.  Neck: Normal range of motion.  Cardiovascular: Normal rate, regular rhythm and normal heart sounds.   Respiratory: Effort normal and breath sounds normal.  GI: Soft. Bowel sounds are normal. She exhibits no mass. There is tenderness. There is no rebound and no guarding.  Neurological: She is alert and oriented to person, place, and time.  Skin: Skin is warm and dry.  Psychiatric:  She has a normal mood and affect. Her behavior is normal. Judgment and thought content normal.     MAU Course  Procedures  Cervical exam: closed, thick, and high    Assessment and Plan  In summary, Anne Murillo is a 30 yo F at [redacted]w[redacted]d who presents with 3 days of left lower quadrant abdominal/pelvic pain. Cervical exam and fetal monitoring are reassuring. Most likely round ligament pain.  1. Pain of round ligament affecting pregnancy, antepartum   2. Abdominal pain affecting pregnancy    Treat like muscle strain: warm baths, heating pads,  stretching, abdominal support.   Casimer Leek, MD, PGY-1, MPH 04/09/2016, 12:03 PM   Midwife attestation:  I have seen and examined this patient; I agree with above documentation in the resident's note.   Anne Murillo is a 30 y.o. G3P1011 reporting LLQ pain that increases with movement/position change x 3-4 days. The pain is sharp, intermittent, and unrelieved by Tylenol.  +FM, denies LOF, VB, contractions, vaginal discharge.  PE: BP 118/74   Pulse 93   Temp 98.9 F (37.2 C) (Oral)   Resp 16   LMP 10/20/2015  Gen: calm comfortable, NAD Resp: normal effort, no distress Abd: gravid  ROS, labs, PMH reviewed NST reactive  MDM:  No evidence of preterm labor, no evidence of infection or acute abdomen. Pain is in left inguinal area and increases with movement, likely musculoskeletal or round ligament pain. Rest/ice/heat/warm bath/Tylenol for pain.  Pt stable at time of discharge.  A/P: Follow up with prenatal visits as scheduled.  Return to MAU as needed for emergencies.  LEFTWICH-KIRBY, Lattie Haw, CNM  9:34 PM

## 2016-05-28 ENCOUNTER — Inpatient Hospital Stay (HOSPITAL_COMMUNITY)
Admission: AD | Admit: 2016-05-28 | Discharge: 2016-05-28 | Disposition: A | Discharge status: Home / Self Care | Payer: Medicaid Other | Source: Ambulatory Visit | Attending: Obstetrics & Gynecology | Admitting: Obstetrics & Gynecology

## 2016-05-28 ENCOUNTER — Encounter (HOSPITAL_COMMUNITY): Payer: Self-pay | Admitting: *Deleted

## 2016-05-28 DIAGNOSIS — R109 Unspecified abdominal pain: Secondary | ICD-10-CM | POA: Diagnosis present

## 2016-05-28 DIAGNOSIS — Z3A31 31 weeks gestation of pregnancy: Secondary | ICD-10-CM | POA: Diagnosis not present

## 2016-05-28 DIAGNOSIS — O2340 Unspecified infection of urinary tract in pregnancy, unspecified trimester: Secondary | ICD-10-CM

## 2016-05-28 DIAGNOSIS — O2343 Unspecified infection of urinary tract in pregnancy, third trimester: Secondary | ICD-10-CM | POA: Diagnosis not present

## 2016-05-28 LAB — URINE MICROSCOPIC-ADD ON

## 2016-05-28 LAB — URINALYSIS, ROUTINE W REFLEX MICROSCOPIC
Bilirubin Urine: NEGATIVE
Glucose, UA: 500 mg/dL — AB
Ketones, ur: NEGATIVE mg/dL
Nitrite: POSITIVE — AB
Protein, ur: 100 mg/dL — AB
SPECIFIC GRAVITY, URINE: 1.025 (ref 1.005–1.030)
pH: 6 (ref 5.0–8.0)

## 2016-05-28 MED ORDER — CEPHALEXIN 500 MG PO CAPS: 500.0000 mg | ORAL_CAPSULE | Freq: Four times a day (QID) | ORAL | 0 refills | Status: AC

## 2016-05-28 NOTE — MAU Provider Note (Signed)
History     CSN: [id number]  Arrival date and time: 05/28/16 1111   None     Chief Complaint  Patient presents with  . Abdominal Pain  . Back Pain  . Dysuria   Anne Murillo is a 30 y.o. G3P1011 at [redacted]w[redacted]d who presents with abdominal pain. States started having gradual onset of b/l lower cramping abdominal pain last night that radiated into lower back, now rates 9 out of 10. Started having dysuria with urinary urgency and frequency this am. Was concerned because had miscarriage in the past and abdominal pain felt similar. Denies LOF, vaginal discharge or bleeding. Endorses good fetal movement. Denies fever/chills, n/v.    OB History    Gravida Para Term Preterm AB Living   3 1 1   1 1    SAB TAB Ectopic Multiple Live Births   1       1      Past Medical History:  Diagnosis Date  . Medical history non-contributory     Past Surgical History:  Procedure Laterality Date  . CESAREAN SECTION N/A 12/05/2013   Procedure: Primary Cesarean Section Delivery Baby Girl @ 0136, Apgars 9/9;  Surgeon: Osborne Oman, MD;  Location: [facility];  Service: Obstetrics;  Laterality: N/A;  . DILATION AND EVACUATION N/A 07/20/2015   Procedure: DILATATION AND EVACUATION;  Surgeon: Bobbye Charleston, MD;  Location: [facility];  Service: Gynecology;  Laterality: N/A;    History reviewed. No pertinent family history.  Social History  Substance Use Topics  . Smoking status: Never Smoker  . Smokeless tobacco: Never Used  . Alcohol use No    Allergies: No Known Allergies  Prescriptions Prior to Admission  Medication Sig Dispense Refill Last Dose  . Prenatal Vit-Fe Fumarate-FA (PRENATAL VITAMINS) 28-0.8 MG TABS Take 1 tablet by mouth daily. (Patient taking differently: Take 1 tablet by mouth daily. ) 30 tablet 11 Past Week at Unknown time  . docusate sodium (COLACE) 100 MG capsule Take 1 capsule (100 mg total) by mouth 2 (two) times daily as needed. (Patient not taking: Reported on 05/28/2016) 30  capsule 2 Not Taking at Unknown time  . glycopyrrolate (ROBINUL) 2 MG tablet Take 1 tablet (2 mg total) by mouth 3 (three) times daily as needed. (Patient not taking: Reported on 05/28/2016) 30 tablet 3 Not Taking at Unknown time  . polyethylene glycol powder (GLYCOLAX/MIRALAX) powder Take 17 g by mouth daily. (Patient not taking: Reported on 05/28/2016) 255 g 0 Not Taking at Unknown time  . promethazine (PHENERGAN) 25 MG tablet Take 0.5-1 tablets (12.5-25 mg total) by mouth every 6 (six) hours as needed for nausea. (Patient not taking: Reported on 05/28/2016) 30 tablet 2 Not Taking at Unknown time  . ranitidine (ZANTAC) 150 MG tablet Take 1 tablet (150 mg total) by mouth 2 (two) times daily. (Patient not taking: Reported on 05/28/2016) 60 tablet 2 Not Taking at Unknown time    Review of Systems  Constitutional: Negative for chills and fever.  Respiratory: Negative for shortness of breath.   Cardiovascular: Negative for chest pain.  Gastrointestinal: Positive for abdominal pain (b/l lower). Negative for constipation, diarrhea, heartburn, nausea and vomiting.  Genitourinary: Positive for dysuria, frequency and urgency. Negative for flank pain and hematuria.  Musculoskeletal: Positive for back pain (b/l lower).   Physical Exam   Blood pressure 121/70, pulse (!) 121, temperature 98.7 F (37.1 C), temperature source Oral, resp. rate 18, last menstrual period 10/20/2015.  Physical Exam  Constitutional: She is oriented  to person, place, and time. She appears well-developed and well-nourished. No distress.  HENT:  Head: Normocephalic and atraumatic.  Cardiovascular: Normal rate, regular rhythm and normal heart sounds.   No murmur heard. Respiratory: Effort normal and breath sounds normal. No respiratory distress.  GI: Soft. Bowel sounds are normal. There is tenderness (TTP over round ligaments). There is no rebound and no guarding.  No CVA tenderness  Neurological: She is alert and oriented to  person, place, and time.  Skin: Skin is warm and dry.  Psychiatric: She has a normal mood and affect.    MAU Course  Procedures  MDM Urinalysis with many bacteria and 6-30 squamous cells    Component Value Date/Time   COLORURINE YELLOW 05/28/2016 1123   APPEARANCEUR HAZY (A) 05/28/2016 1123   LABSPEC 1.025 05/28/2016 1123   PHURINE 6.0 05/28/2016 1123   GLUCOSEU 500 (A) 05/28/2016 1123   HGBUR MODERATE (A) 05/28/2016 1123   BILIRUBINUR NEGATIVE 05/28/2016 1123   BILIRUBINUR n 11/26/2015 1212   KETONESUR NEGATIVE 05/28/2016 1123   PROTEINUR 100 (A) 05/28/2016 1123   UROBILINOGEN negative 11/26/2015 1212   UROBILINOGEN 1.0 07/20/2015 1745   NITRITE POSITIVE (A) 05/28/2016 1123   LEUKOCYTESUR SMALL (A) 05/28/2016 1123     Assessment and Plan  IUP@31 .4 UTI - keflex 500mg  QID x 7 days - given return precautions and labor precautions - 3 hr GTT as outpatient -send urine culutre  Bufford Lope PGY-1 05/28/2016, 12:48 PM   OB FELLOW DISCHARGE ATTESTATION  I have seen and examined this patient and agree with above documentation in the resident's note.   Jacquiline Doe, MD 1:55 PM

## 2016-05-28 NOTE — Discharge Instructions (Signed)
Pregnancy and Urinary Tract Infection °A urinary tract infection (UTI) is a bacterial infection of the urinary tract. Infection of the urinary tract can include the ureters, kidneys (pyelonephritis), bladder (cystitis), and urethra (urethritis). All pregnant women should be screened for bacteria in the urinary tract. Identifying and treating a UTI will decrease the risk of preterm labor and developing more serious infections in both the mother and baby. °CAUSES °Bacteria germs cause almost all UTIs.  °RISK FACTORS °Many factors can increase your chances of getting a UTI during pregnancy. These include: °· Having a short urethra. °· Poor toilet and hygiene habits. °· Sexual intercourse. °· Blockage of urine along the urinary tract. °· Problems with the pelvic muscles or nerves. °· Diabetes. °· Obesity. °· Bladder problems after having several children. °· Previous history of UTI. °SIGNS AND SYMPTOMS  °· Pain, burning, or a stinging feeling when urinating. °· Suddenly feeling the need to urinate right away (urgency). °· Loss of bladder control (urinary incontinence). °· Frequent urination, more than is common with pregnancy. °· Lower abdominal or back discomfort. °· Cloudy urine. °· Blood in the urine (hematuria). °· Fever.  °When the kidneys are infected, the symptoms may be: °· Back pain. °· Flank pain on the right side more so than the left. °· Fever. °· Chills. °· Nausea. °· Vomiting. °DIAGNOSIS  °A urinary tract infection is usually diagnosed through urine tests. Additional tests and procedures are sometimes done. These may include: °· Ultrasound exam of the kidneys, ureters, bladder, and urethra. °· Looking in the bladder with a lighted tube (cystoscopy). °TREATMENT °Typically, UTIs can be treated with antibiotic medicines.  °HOME CARE INSTRUCTIONS  °· Only take over-the-counter or prescription medicines as directed by your health care provider. If you were prescribed antibiotics, take them as directed. Finish  them even if you start to feel better. °· Drink enough fluids to keep your urine clear or pale yellow. °· Do not have sexual intercourse until the infection is gone and your health care provider says it is okay. °· Make sure you are tested for UTIs throughout your pregnancy. These infections often come back.  °Preventing a UTI in the Future °· Practice good toilet habits. Always wipe from front to back. Use the tissue only once. °· Do not hold your urine. Empty your bladder as soon as possible when the urge comes. °· Do not douche or use deodorant sprays. °· Wash with soap and warm water around the genital area and the anus. °· Empty your bladder before and after sexual intercourse. °· Wear underwear with a cotton crotch. °· Avoid caffeine and carbonated drinks. They can irritate the bladder. °· Drink cranberry juice or take cranberry pills. This may decrease the risk of getting a UTI. °· Do not drink alcohol. °· Keep all your appointments and tests as scheduled.  °SEEK MEDICAL CARE IF:  °· Your symptoms get worse. °· You are still having fevers 2 or more days after treatment begins. °· You have a rash. °· You feel that you are having problems with medicines prescribed. °· You have abnormal vaginal discharge. °SEEK IMMEDIATE MEDICAL CARE IF:  °· You have back or flank pain. °· You have chills. °· You have blood in your urine. °· You have nausea and vomiting. °· You have contractions of your uterus. °· You have a gush of fluid from the vagina. °MAKE SURE YOU: °· Understand these instructions.   °· Will watch your condition.   °· Will get help right away if you are not doing   pain.  · You have chills.  · You have blood in your urine.  · You have nausea and vomiting.  · You have contractions of your uterus.  · You have a gush of fluid from the vagina.  MAKE SURE YOU:  · Understand these instructions.    · Will watch your condition.    · Will get help right away if you are not doing well or get worse.       This information is not intended to replace advice given to you by your health care provider. Make sure you discuss any questions you have with your health care provider.     Document Released: 12/26/2010 Document Revised: 06/21/2013 Document Reviewed: 03/30/2013  Elsevier Interactive Patient Education ©2016 Elsevier  Inc.

## 2016-05-28 NOTE — MAU Note (Signed)
Pt C/O lower abd & lower back pain since last night, denies bleeding or LOF.  Noted dysuria this a.m., denies vomiting or diarrhea.

## 2016-05-30 LAB — CULTURE, OB URINE: Culture: >=100000 — AB

## 2016-05-31 ENCOUNTER — Encounter: Payer: Self-pay | Admitting: Advanced Practice Midwife

## 2016-07-06 ENCOUNTER — Encounter: Payer: Self-pay | Admitting: Obstetrics and Gynecology

## 2016-07-06 ENCOUNTER — Ambulatory Visit (INDEPENDENT_AMBULATORY_CARE_PROVIDER_SITE_OTHER): Payer: Medicaid Other | Admitting: Obstetrics and Gynecology

## 2016-07-06 ENCOUNTER — Telehealth: Payer: Self-pay | Admitting: *Deleted

## 2016-07-06 VITALS — BP 132/72 | HR 105 | Wt 197.2 lb

## 2016-07-06 DIAGNOSIS — Z98891 History of uterine scar from previous surgery: Secondary | ICD-10-CM

## 2016-07-06 DIAGNOSIS — O322XX0 Maternal care for transverse and oblique lie, not applicable or unspecified: Secondary | ICD-10-CM

## 2016-07-06 DIAGNOSIS — B951 Streptococcus, group B, as the cause of diseases classified elsewhere: Secondary | ICD-10-CM | POA: Insufficient documentation

## 2016-07-06 DIAGNOSIS — O2343 Unspecified infection of urinary tract in pregnancy, third trimester: Secondary | ICD-10-CM | POA: Diagnosis not present

## 2016-07-06 DIAGNOSIS — O10913 Unspecified pre-existing hypertension complicating pregnancy, third trimester: Secondary | ICD-10-CM

## 2016-07-06 DIAGNOSIS — O10919 Unspecified pre-existing hypertension complicating pregnancy, unspecified trimester: Secondary | ICD-10-CM

## 2016-07-06 DIAGNOSIS — A491 Streptococcal infection, unspecified site: Secondary | ICD-10-CM | POA: Diagnosis not present

## 2016-07-06 DIAGNOSIS — O403XX0 Polyhydramnios, third trimester, not applicable or unspecified: Secondary | ICD-10-CM

## 2016-07-06 DIAGNOSIS — O2340 Unspecified infection of urinary tract in pregnancy, unspecified trimester: Secondary | ICD-10-CM

## 2016-07-06 DIAGNOSIS — Z603 Acculturation difficulty: Secondary | ICD-10-CM | POA: Diagnosis not present

## 2016-07-06 DIAGNOSIS — O329XX0 Maternal care for malpresentation of fetus, unspecified, not applicable or unspecified: Secondary | ICD-10-CM | POA: Diagnosis not present

## 2016-07-06 DIAGNOSIS — O0993 Supervision of high risk pregnancy, unspecified, third trimester: Secondary | ICD-10-CM | POA: Insufficient documentation

## 2016-07-06 DIAGNOSIS — O34219 Maternal care for unspecified type scar from previous cesarean delivery: Secondary | ICD-10-CM

## 2016-07-06 LAB — POCT URINALYSIS DIP (DEVICE)
GLUCOSE, UA: 100 mg/dL — AB
Hgb urine dipstick: NEGATIVE
Nitrite: POSITIVE — AB
PROTEIN: 100 mg/dL — AB
SPECIFIC GRAVITY, URINE: 1.02 (ref 1.005–1.030)
Urobilinogen, UA: 1 mg/dL (ref 0.0–1.0)
pH: 5.5 (ref 5.0–8.0)

## 2016-07-06 MED ORDER — CEPHALEXIN 500 MG PO CAPS: 500.0000 mg | ORAL_CAPSULE | Freq: Four times a day (QID) | ORAL | 0 refills | Status: AC

## 2016-07-06 NOTE — Telephone Encounter (Signed)
Attempted to call patient using [place] interpreter [phone]. Per Dr Ilda Basset, patient needs twice weekly testing starting this Thursday 10/26. He has inboxed the front office to schedule this. Message was left at both home and mobile number stating that I am calling about an appointment for her this Thursday. Please call with any questions.

## 2016-07-06 NOTE — Progress Notes (Signed)
New OB Note  07/06/2016   Clinic: [facility]  Chief Complaint: transfer of care  Transfer of Care Patient: Yes, [facility]  History of Present Illness: Ms. Schilz is a 30 y.o. G3P1011 @ 37/1 weeks ([place] 11/8, based onPatient's last menstrual period was 10/20/2015.=9wk u/s), with the above CC. Preg complicated by has Language barrier, cultural differences; Group B streptococcal infection; Chronic hypertension affecting pregnancy; History of cesarean delivery; Supervision of high risk pregnancy in third trimester; Polyhydramnios affecting pregnancy in third trimester; and Malpresentation of fetus on her problem list.   No s/s of labor or decreased FM  ROS: A 12-point review of systems was performed and negative, except as stated in the above HPI.  OBGYN History: As per HPI. OB History  Gravida Para Term Preterm AB Living  3 1 1   1 1   SAB TAB Ectopic Multiple Live Births  1       1    # Outcome Date GA Lbr Len/2nd Weight Sex Delivery Anes PTL Lv  3 Current           2 Term 12/05/13 [redacted]w[redacted]d  8 lb 1.8 oz (3.68 kg) F CS-LTranv EPI  LIV  1 SAB             Obstetric Comments  11/2013 pLTCS for NRFHT in 1st stage due to lates. 3680gm   Any issues with any prior pregnancies: Yes, cHTN and above Any prior children are healthy, doing well, without any problems or issues: yes History of pap smears: neg 2017 pap smear  Past Medical History: Past Medical History:  Diagnosis Date  . Benign essential HTN   . Irregular heart rhythm 04/24/2013    Past Surgical History: Past Surgical History:  Procedure Laterality Date  . CESAREAN SECTION N/A 12/05/2013   Procedure: Primary Cesarean Section Delivery Baby Girl @ 0136, Apgars 9/9;  Surgeon: Osborne Oman, MD;  Location: [facility];  Service: Obstetrics;  Laterality: N/A;  . DILATION AND EVACUATION N/A 07/20/2015   Procedure: DILATATION AND EVACUATION;  Surgeon: Bobbye Charleston, MD;  Location: [facility];  Service: Gynecology;   Laterality: N/A;    Family History:  History reviewed. No pertinent family history.  Social History:  Social History   Social History  . Marital status: Married    Spouse name: N/A  . Number of children: N/A  . Years of education: N/A   Occupational History  . Not on file.   Social History Main Topics  . Smoking status: Never Smoker  . Smokeless tobacco: Never Used  . Alcohol use No  . Drug use: No  . Sexual activity: Not Currently    Birth control/ protection: None   Other Topics Concern  . Not on file   Social History Narrative  . No narrative on file    Allergy: No Known Allergies  Health Maintenance:  Mammogram Up to Date: not applicable  Current Outpatient Medications: PNV  Physical Exam:   BP 132/72   Pulse (!) 105   Wt 197 lb 3.2 oz (89.4 kg)   LMP 10/20/2015   BMI 36.66 kg/m  Body mass index is 36.66 kg/m. Fundal height: 37 FHTs: 140s  General appearance: Well nourished, well developed female in no acute distress.   Laboratory: [facility] labs reviewed  Imaging:  10/9: breech. efw 3037gm, AFI 28, normal AC. BPP 8/8  Assessment: pt stable  Plan: 1. Supervision of high risk pregnancy in third trimester Routine care.   2. Polyhydramnios in  third trimester complication, single or unspecified fetus -patient for NST/AFI today-->AFI 21 but patient still not vertex (see below). 145 baseline, +accels,no decel, mod variability. toco with occasional UCs. 20-30 minutes for time -S/p neg 1st trimester and afp screening with negative mfm anatomy scan. 3hr GTT testing done even though 1hr GCT negative x 2, due to poly.   3. History of cesarean delivery D/w pt re: history. I recommend rpt c-section given her history of failed IOL with caput and molding noted at 5cm/70% effaced for a 3680gm infant and this fetus weight at least similarly.  Pt to consider options and appears that she's had TOLAC consult at [facility] prior but can have her sign TOLAC consent here  in clinic next week.   4. Malpresentation Transverse head maternal right with spine up and AFI 21. D/w her re: ECV and r/b/a and pt would like to wait a week and see. Labor precautions given  5. cHTN Will start 2x/week testing. Pt on no meds Repeat growth for 11/6  6. GBS pos Tx in labor  7. +nitrites UCx sent and pt given keflex  interpreter used.   Problem list reviewed and updated.  Follow up in 3d for NST and 1wk for NST/AFI  >50% of 30 min visit spent on counseling and coordination of care.     Durene Romans MD Attending Center for [facility])

## 2016-07-08 LAB — CULTURE, OB URINE

## 2016-07-09 ENCOUNTER — Ambulatory Visit (INDEPENDENT_AMBULATORY_CARE_PROVIDER_SITE_OTHER): Payer: Medicaid Other | Admitting: *Deleted

## 2016-07-09 DIAGNOSIS — O403XX0 Polyhydramnios, third trimester, not applicable or unspecified: Secondary | ICD-10-CM

## 2016-07-10 NOTE — Progress Notes (Signed)
NST performed today was reviewed and was found to be reactive.  Continue recommended antenatal testing and prenatal care.  

## 2016-07-13 ENCOUNTER — Ambulatory Visit: Payer: Self-pay

## 2016-07-13 ENCOUNTER — Ambulatory Visit (HOSPITAL_COMMUNITY): Payer: Medicaid Other

## 2016-07-13 ENCOUNTER — Encounter: Payer: Self-pay | Admitting: Student

## 2016-07-13 ENCOUNTER — Ambulatory Visit (INDEPENDENT_AMBULATORY_CARE_PROVIDER_SITE_OTHER): Payer: Medicaid Other | Admitting: Student

## 2016-07-13 VITALS — BP 125/77 | HR 108 | Wt 196.6 lb

## 2016-07-13 DIAGNOSIS — O403XX0 Polyhydramnios, third trimester, not applicable or unspecified: Secondary | ICD-10-CM

## 2016-07-13 DIAGNOSIS — Z98891 History of uterine scar from previous surgery: Secondary | ICD-10-CM

## 2016-07-13 DIAGNOSIS — O10913 Unspecified pre-existing hypertension complicating pregnancy, third trimester: Secondary | ICD-10-CM

## 2016-07-13 DIAGNOSIS — Z3689 Encounter for other specified antenatal screening: Secondary | ICD-10-CM | POA: Diagnosis not present

## 2016-07-13 DIAGNOSIS — O329XX0 Maternal care for malpresentation of fetus, unspecified, not applicable or unspecified: Secondary | ICD-10-CM

## 2016-07-13 DIAGNOSIS — O2343 Unspecified infection of urinary tract in pregnancy, third trimester: Secondary | ICD-10-CM | POA: Diagnosis not present

## 2016-07-13 DIAGNOSIS — Z603 Acculturation difficulty: Secondary | ICD-10-CM

## 2016-07-13 DIAGNOSIS — O322XX0 Maternal care for transverse and oblique lie, not applicable or unspecified: Secondary | ICD-10-CM | POA: Diagnosis not present

## 2016-07-13 DIAGNOSIS — O34219 Maternal care for unspecified type scar from previous cesarean delivery: Secondary | ICD-10-CM | POA: Diagnosis not present

## 2016-07-13 DIAGNOSIS — O0993 Supervision of high risk pregnancy, unspecified, third trimester: Secondary | ICD-10-CM

## 2016-07-13 NOTE — Progress Notes (Signed)
[place] interpreter # [phone] used for encounter.

## 2016-07-13 NOTE — Patient Instructions (Addendum)
Polyhydramnios When a woman becomes pregnant, a sac is formed around the fertilized egg (embryo) and later the growing baby (fetus). This sac is called the amniotic sac. The amniotic sac is filled with fluid. It gets bigger as the pregnancy grows. When there is too much fluid in the sac, it is called polyhydramnios. All babies born with polyhydramnios should be looked at for congenital abnormalities. The amniotic fluid cushions and protects the baby. It also provides the baby with fluids and is crucial to normal development. Your baby breathes this fluid into its lungs and swallows it. This helps promote the healthy growth of the lungs and gastrointestinal tract. Amniotic fluid also helps the baby move around, helping with the normal development of muscle and bone.  CAUSES   Diabetes mellitus.  Downs Syndrome, fetal abnormalities of the intestinal tract and anencephaly (the fetus has no brain) can prevent the fetus from swallowing the amniotic fluid.  One twins passes (transfuses) their blood into the other twin (twin-twin transfusion syndrome).  Medical illness of the mother or heart.  Kidney disease.  Tumor (chorioangioma) of the placenta. SYMPTOMS   The uterus enlarges beyond the size it should be for that particular time of the pregnancy.  The mother may feel more pressure and discomfort than should be expected.  The mother may notice a quick and unexpected enlargement of her stomach. DIAGNOSIS   Your health care provider notices your uterus is beyond the size that is consistent with the time of the pregnancy when he or she measures you.  An ultrasound is then used (abdominally or vaginally) to see if there are twins or more, measure the growth of the baby, looks for birth defects and measures the amount of fluid in the amniotic sac.  Amniotic Fluid Index (AFI). AFI measures the amount of fluid in the amniotic sac in four different areas. If there is more than 24 centimeters, you  have polyhydramnios. TREATMENT   Removing some fluid from the amniotic sac.  Take medications that lower the fluids in your body.  Stop using salt or salty foods because it causes you to keep fluid in your body (retention).  If your health care provider thinks you have polyhydramnios, you will likely need more testing. You will be watched for the rest of your pregnancy. HOME CARE INSTRUCTIONS   Keep all your prenatal appointments. Follow your health care provider's recommendations.  Do not eat a lot of salt and salty foods.  If you have diabetes, keep in it control.  If you have heart or kidney disease, treat the disease as advised by your health care provider. SEEK MEDICAL CARE IF:   You think your uterus has grown too fast in a short period of time.  You feel a great amount of pressure in your lower belly (pelvis) and are more uncomfortable than expected. SEEK IMMEDIATE MEDICAL CARE IF:   You have a gush of fluid or are leaking fluid from your vagina.  You stop feeling the baby move.  You do not feel the baby kicking as much as usual.  You have a hard time keeping your diabetes under control.  You are having problems with your heart or kidney disease.   This information is not intended to replace advice given to you by your health care provider. Make sure you discuss any questions you have with your health care provider.   Document Released: 11/21/2002 Document Revised: 06/21/2013 Document Reviewed: 03/30/2013 Elsevier Interactive Patient Education 2016 [employer]. Cesarean Delivery  Cesarean delivery is the birth of a baby through a cut (incision) in the abdomen and womb (uterus).  LET [facility] CARE PROVIDER KNOW ABOUT:  All medicines you are taking, including vitamins, herbs, eye drops, creams, and over-the-counter medicines.  Previous problems you or members of your family have had with the use of anesthetics.  Any bleeding or blood clotting disorders you  have.  Family history of blood clots or bleeding disorders.  Any history of deep vein thrombosis (DVT) or pulmonary embolism (PE).  Previous surgeries you have had.  Medical conditions you have.  Any allergies you have.  Complicationsinvolving the pregnancy. RISKS AND COMPLICATIONS  Generally, this is a safe procedure. However, as with any procedure, complications can occur. Possible complications include:  Bleeding.  Infection.  Blood clots.  Injury to surrounding organs.  Problems with anesthesia.  Injury to the baby. BEFORE THE PROCEDURE   You may be given an antacid medicine to drink. This will prevent acid contents in your stomach from going into your lungs if you vomit during the surgery.  You may be given an antibiotic medicine to prevent infection. PROCEDURE   To prevent infection of your incision:  Hair may be removed from your pubic area if it is near your incision.  The skin of your pubic area and lower abdomen will be cleaned with a germ-killing solution (antiseptic).  A tube (Foley catheter) will be placed in your bladder to drain your urine from your bladder into a bag. This keeps your bladder empty during surgery.  An IV tube will be placed in your vein.  You may be given medicine to numb the lower half of your body (regional anesthetic). If you were in labor, you may have already had an epidural in place which can be used in both labor and cesarean delivery. You may possibly be given medicine to make you sleep (general anesthetic) though this is not as common.  Your heart rate and your baby's heart rate will be monitored.  An incision will be made in your abdomen that extends to your uterus. There are 2 basic kinds of incisions:  The horizontal (transverse) incision. Horizontal incisions are from side to side and are used for most routine cesarean deliveries.  The vertical incision. The vertical incision is from the top of the abdomen to the bottom  and is less commonly used. It is often done for women who have a serious complication (extreme prematurity) or under emergency situations.  The horizontal and vertical incisions may both be used at the same time. However, this is very uncommon.  An incision is then made in your uterus to deliver the baby.  Your baby will be delivered.  Your health care provider may place the baby on your chest. It is important to keep the baby warm. Your health care provider will dry off the baby, place the baby directly on your bare skin, and cover the baby with warm, dry blankets.  Both incisions will be closed with absorbable stitches. AFTER THE PROCEDURE   If you were awake during the surgery, you will see your baby right away. If you were asleep, you will see your baby as soon as you are awake.  You may breastfeed your baby after surgery.  You may be able to get up and walk the same day as the surgery. If you need to stay in bed for a period of time, you will receive help to turn, cough, and take deep breaths after surgery.  This helps prevent lung problems such as pneumonia.  Do not get out of bed alone the first time after surgery. You will need help getting out of bed until you are able to do this by yourself.  You may be able to shower the day after your cesarean delivery. After the bandage (dressing) is taken off the incision site, a nurse will assist you to shower if you would like help.  You may be directed to take actions to help prevent blood clots in your legs. These may include:  Walking shortly after surgery, with someone assisting you. Moving around after surgery helps to improve blood flow.  Wearing compression stockings or using different types of devices.  Taking medicines to thin your blood (anticoagulants) if you are at high risk for DVT or PE.  Save any blood clots that you pass from your vagina. If you pass a clot while on the toilet, do not flush it. Call for the nurse. Tell  the nurse if you think you are bleeding too much or passing too many clots.  You will be given medicine for pain and nausea as needed. Let your health care providers know if you are hurting. You may also be given an antibiotic to prevent an infection.  Your IV tube will be taken out when you are drinking a reasonable amount of fluids. The Foley catheter is taken out when you are up and walking.  If your blood type is Rh negative and your baby's blood type is Rh positive, you will be given a shot of anti-D immune globulin. This shot prevents you from having Rh problems with a future pregnancy. You should get the shot even if you had your tubes tied (tubal ligation).  If you are allowed to take the baby for a walk, place the baby in the bassinet and push it.   This information is not intended to replace advice given to you by your health care provider. Make sure you discuss any questions you have with your health care provider.   Document Released: 08/31/2005 Document Revised: 05/22/2015 Document Reviewed: 04/27/2012 Elsevier Interactive Patient Education [employer].

## 2016-07-13 NOTE — Progress Notes (Signed)
   PRENATAL VISIT NOTE  Subjective:  Anne Murillo is a 30 y.o. G3P1011 at [redacted]w[redacted]d being seen today for ongoing prenatal care.  She is currently monitored for the following issues for this high-risk pregnancy and has Language barrier, cultural differences; Group B streptococcal infection; Chronic hypertension affecting pregnancy; History of cesarean delivery; Supervision of high risk pregnancy in third trimester; Polyhydramnios affecting pregnancy in third trimester; and UTI in pregnancy on her problem list.  Patient reports occasional contractions.  Contractions: Irregular. Vag. Bleeding: None.  Movement: Present. Denies leaking of fluid.   The following portions of the patient's history were reviewed and updated as appropriate: allergies, current medications, past family history, past medical history, past social history, past surgical history and problem list. Problem list updated.  Objective:   Vitals:   07/13/16 1459  BP: 125/77  Pulse: (!) 108  Weight: 196 lb 9.6 oz (89.2 kg)    Fetal Status: Fetal Heart Rate (bpm): NST   Movement: Present  Presentation: Vertex    Fundal height 40 cm  General:  Alert, oriented and cooperative. Patient is in no acute distress.  Skin: Skin is warm and dry. No rash noted.   Cardiovascular: Normal heart rate noted  Respiratory: Normal respiratory effort, no problems with respiration noted  Abdomen: Soft, gravid, appropriate for gestational age. Pain/Pressure: Present     Pelvic:  Cervical exam performed      Closed/thick/high  Extremities: Normal range of motion.  Edema: None  Mental Status: Normal mood and affect. Normal behavior. Normal judgment and thought content.   Assessment and Plan:  Pregnancy: G3P1011 at [redacted]w[redacted]d  1. Polyhydramnios in third trimester complication, single or unspecified fetus -Delivery @ 39 wks - Fetal nonstress test - US OB Limited  2. Supervision of high risk pregnancy in third trimester   3. Language barrier,  cultural differences -[place] interpreter via language line  5. Transverse lie of fetus, single or unspecified fetus -pt vertex today by ultrasound  6. History of cesarean delivery -Pt desires repeat c/section -msg to [place] to schedule RCS at 39 wks  Term labor symptoms and general obstetric precautions including but not limited to vaginal bleeding, contractions, leaking of fluid and fetal movement were reviewed in detail with the patient. Please refer to After Visit Summary for other counseling recommendations.  Return in about 4 days (around 07/17/2016) for for NST.  Jorje Guild, NP

## 2016-07-15 ENCOUNTER — Encounter (HOSPITAL_COMMUNITY): Payer: Self-pay

## 2016-07-15 NOTE — Pre-Procedure Instructions (Signed)
[phone] interpreter number

## 2016-07-17 ENCOUNTER — Ambulatory Visit (INDEPENDENT_AMBULATORY_CARE_PROVIDER_SITE_OTHER): Payer: Medicaid Other | Admitting: Obstetrics and Gynecology

## 2016-07-17 ENCOUNTER — Other Ambulatory Visit: Payer: Medicaid Other

## 2016-07-17 ENCOUNTER — Encounter (HOSPITAL_COMMUNITY)
Admission: RE | Admit: 2016-07-17 | Discharge: 2016-07-17 | Disposition: A | Discharge status: Home / Self Care | Payer: Medicaid Other | Source: Ambulatory Visit | Attending: Obstetrics and Gynecology | Admitting: Obstetrics and Gynecology

## 2016-07-17 DIAGNOSIS — O403XX0 Polyhydramnios, third trimester, not applicable or unspecified: Secondary | ICD-10-CM | POA: Diagnosis present

## 2016-07-17 LAB — CBC
HEMATOCRIT: 40 % (ref 36.0–46.0)
Hemoglobin: 13.8 g/dL (ref 12.0–15.0)
MCH: 28.7 pg (ref 26.0–34.0)
MCHC: 34.5 g/dL (ref 30.0–36.0)
MCV: 83.2 fL (ref 78.0–100.0)
PLATELETS: 199 10*3/uL (ref 150–400)
RBC: 4.81 MIL/uL (ref 3.87–5.11)
RDW: 15.1 % (ref 11.5–15.5)
WBC: 5.7 10*3/uL (ref 4.0–10.5)

## 2016-07-17 LAB — TYPE AND SCREEN
ABO/RH(D): O POS
Antibody Screen: NEGATIVE

## 2016-07-17 NOTE — Progress Notes (Signed)
Reactive NST today 

## 2016-07-17 NOTE — Progress Notes (Signed)
[place] interpreter # [phone] used for encounter.  C/S scheduled 11/5.

## 2016-07-17 NOTE — Patient Instructions (Signed)
20 Anne Murillo  07/17/2016   Your procedure is scheduled on:  07/19/2016  Enter through the Main Entrance of [facility] at [place] up the phone at the desk and dial [phone].   Call this number if you have problems the morning of surgery: [phone]   Remember:   Do not eat food:After Midnight.  Do not drink clear liquids: After Midnight.  Take these medicines the morning of surgery with A SIP OF WATER: none   Do not wear jewelry, make-up or nail polish.  Do not wear lotions, powders, or perfumes. Do not wear deodorant.  Do not shave 48 hours prior to surgery.  Do not bring valuables to the hospital.  [facility] is not   responsible for any belongings or valuables brought to the hospital.  Contacts, dentures or bridgework may not be worn into surgery.  Leave suitcase in the car. After surgery it may be brought to your room.  For patients admitted to the hospital, checkout time is 11:00 AM the day of              discharge.   Patients discharged the day of surgery will not be allowed to drive             home.  Name and phone number of your driver: na  Special Instructions:   N/A   Please read over the following fact sheets that you were given:   Surgical Site Infection Prevention

## 2016-07-18 LAB — RPR: RPR: NONREACTIVE

## 2016-07-19 ENCOUNTER — Inpatient Hospital Stay (HOSPITAL_COMMUNITY): Payer: Medicaid Other

## 2016-07-20 ENCOUNTER — Other Ambulatory Visit: Payer: Medicaid Other | Admitting: Family Medicine

## 2016-07-20 ENCOUNTER — Inpatient Hospital Stay (HOSPITAL_COMMUNITY): Payer: Medicaid Other | Admitting: Anesthesiology

## 2016-07-20 ENCOUNTER — Ambulatory Visit (HOSPITAL_COMMUNITY): Payer: Medicaid Other

## 2016-07-20 ENCOUNTER — Encounter (HOSPITAL_COMMUNITY): Payer: Self-pay

## 2016-07-20 ENCOUNTER — Encounter (HOSPITAL_COMMUNITY)
Admission: RE | Disposition: A | Discharge status: Home / Self Care | Payer: Self-pay | Source: Ambulatory Visit | Attending: Family Medicine

## 2016-07-20 ENCOUNTER — Inpatient Hospital Stay (HOSPITAL_COMMUNITY)
Admission: RE | Admit: 2016-07-20 | Discharge: 2016-07-23 | DRG: 765 | Disposition: A | Discharge status: Home / Self Care | Payer: Medicaid Other | Source: Ambulatory Visit | Attending: Family Medicine | Admitting: Family Medicine

## 2016-07-20 DIAGNOSIS — O403XX0 Polyhydramnios, third trimester, not applicable or unspecified: Secondary | ICD-10-CM | POA: Diagnosis present

## 2016-07-20 DIAGNOSIS — O1092 Unspecified pre-existing hypertension complicating childbirth: Secondary | ICD-10-CM | POA: Diagnosis not present

## 2016-07-20 DIAGNOSIS — M79609 Pain in unspecified limb: Secondary | ICD-10-CM | POA: Diagnosis not present

## 2016-07-20 DIAGNOSIS — O99824 Streptococcus B carrier state complicating childbirth: Secondary | ICD-10-CM | POA: Diagnosis present

## 2016-07-20 DIAGNOSIS — O1002 Pre-existing essential hypertension complicating childbirth: Secondary | ICD-10-CM | POA: Diagnosis present

## 2016-07-20 DIAGNOSIS — M7989 Other specified soft tissue disorders: Secondary | ICD-10-CM

## 2016-07-20 DIAGNOSIS — O34211 Maternal care for low transverse scar from previous cesarean delivery: Principal | ICD-10-CM | POA: Diagnosis present

## 2016-07-20 DIAGNOSIS — Z3A39 39 weeks gestation of pregnancy: Secondary | ICD-10-CM

## 2016-07-20 DIAGNOSIS — O34219 Maternal care for unspecified type scar from previous cesarean delivery: Secondary | ICD-10-CM | POA: Diagnosis not present

## 2016-07-20 DIAGNOSIS — Z98891 History of uterine scar from previous surgery: Secondary | ICD-10-CM

## 2016-07-20 SURGERY — Surgical Case
Anesthesia: Spinal | Site: Abdomen

## 2016-07-20 MED ORDER — DIPHENHYDRAMINE HCL 25 MG PO CAPS
25.0000 mg | ORAL_CAPSULE | ORAL | Status: DC | PRN
  Filled 2016-07-20: qty 1

## 2016-07-20 MED ORDER — MORPHINE SULFATE (PF) 0.5 MG/ML IJ SOLN: INTRAMUSCULAR | Status: DC | PRN

## 2016-07-20 MED ORDER — NALBUPHINE HCL 10 MG/ML IJ SOLN: 5.0000 mg | Freq: Once | INTRAMUSCULAR | Status: DC | PRN

## 2016-07-20 MED ORDER — NALOXONE HCL 0.4 MG/ML IJ SOLN: 0.4000 mg | INTRAMUSCULAR | Status: DC | PRN

## 2016-07-20 MED ORDER — NALBUPHINE HCL 10 MG/ML IJ SOLN: 5.0000 mg | INTRAMUSCULAR | Status: DC | PRN

## 2016-07-20 MED ORDER — KETOROLAC TROMETHAMINE 30 MG/ML IJ SOLN
30.0000 mg | Freq: Four times a day (QID) | INTRAMUSCULAR | Status: DC | PRN
  Administered 2016-07-20: 30 mg via INTRAMUSCULAR

## 2016-07-20 MED ORDER — OXYTOCIN 40 UNITS IN LACTATED RINGERS INFUSION - SIMPLE MED: 2.5000 [IU]/h | INTRAVENOUS | Status: AC

## 2016-07-20 MED ORDER — BUPIVACAINE HCL (PF) 0.5 % IJ SOLN
INTRAMUSCULAR | Status: AC
Start: 2016-07-20 — End: 2016-07-20
  Filled 2016-07-20: qty 30

## 2016-07-20 MED ORDER — PHENYLEPHRINE HCL 10 MG/ML IJ SOLN
INTRAMUSCULAR | Status: DC | PRN
  Administered 2016-07-20 (×2): 80 ug via INTRAVENOUS

## 2016-07-20 MED ORDER — DIBUCAINE 1 % RE OINT
1.0000 | TOPICAL_OINTMENT | RECTAL | Status: DC | PRN
Start: 2016-07-20 — End: 2016-07-23

## 2016-07-20 MED ORDER — LACTATED RINGERS IV SOLN
INTRAVENOUS | Status: DC | PRN
  Administered 2016-07-20 (×3): via INTRAVENOUS

## 2016-07-20 MED ORDER — MEPERIDINE HCL 25 MG/ML IJ SOLN: 6.2500 mg | INTRAMUSCULAR | Status: DC | PRN

## 2016-07-20 MED ORDER — SIMETHICONE 80 MG PO CHEW
80.0000 mg | CHEWABLE_TABLET | ORAL | Status: DC
  Administered 2016-07-21 – 2016-07-23 (×3): 80 mg via ORAL
  Filled 2016-07-20 (×3): qty 1

## 2016-07-20 MED ORDER — ZOLPIDEM TARTRATE 5 MG PO TABS: 5.0000 mg | ORAL_TABLET | Freq: Every evening | ORAL | Status: DC | PRN

## 2016-07-20 MED ORDER — DIPHENHYDRAMINE HCL 25 MG PO CAPS: 25.0000 mg | ORAL_CAPSULE | ORAL | Status: DC | PRN

## 2016-07-20 MED ORDER — MORPHINE SULFATE-NACL 0.5-0.9 MG/ML-% IV SOSY
PREFILLED_SYRINGE | INTRAVENOUS | Status: DC | PRN
  Administered 2016-07-20: .2 mg via EPIDURAL

## 2016-07-20 MED ORDER — OXYTOCIN 10 UNIT/ML IJ SOLN
INTRAMUSCULAR | Status: DC | PRN
  Administered 2016-07-20: 40 [IU]

## 2016-07-20 MED ORDER — FENTANYL CITRATE (PF) 100 MCG/2ML IJ SOLN
INTRAMUSCULAR | Status: AC
  Filled 2016-07-20: qty 2

## 2016-07-20 MED ORDER — MORPHINE SULFATE-NACL 0.5-0.9 MG/ML-% IV SOSY
PREFILLED_SYRINGE | INTRAVENOUS | Status: AC
  Filled 2016-07-20: qty 1

## 2016-07-20 MED ORDER — DIPHENHYDRAMINE HCL 50 MG/ML IJ SOLN: 12.5000 mg | INTRAMUSCULAR | Status: DC | PRN

## 2016-07-20 MED ORDER — PRENATAL MULTIVITAMIN CH
1.0000 | ORAL_TABLET | Freq: Every day | ORAL | Status: DC
  Administered 2016-07-21 – 2016-07-23 (×3): 1 via ORAL
  Filled 2016-07-20 (×3): qty 1

## 2016-07-20 MED ORDER — INFLUENZA VAC SPLIT QUAD 0.5 ML IM SUSY: 0.5000 mL | PREFILLED_SYRINGE | INTRAMUSCULAR | Status: DC

## 2016-07-20 MED ORDER — KETOROLAC TROMETHAMINE 30 MG/ML IJ SOLN: 30.0000 mg | Freq: Four times a day (QID) | INTRAMUSCULAR | Status: DC | PRN

## 2016-07-20 MED ORDER — OXYCODONE HCL 5 MG PO TABS
5.0000 mg | ORAL_TABLET | Freq: Once | ORAL | Status: DC
Start: 2016-07-20 — End: 2016-07-20

## 2016-07-20 MED ORDER — COCONUT OIL OIL
1.0000 "application " | TOPICAL_OIL | Status: DC | PRN
  Administered 2016-07-21: 1 via TOPICAL
  Filled 2016-07-20: qty 120

## 2016-07-20 MED ORDER — ONDANSETRON HCL 4 MG/2ML IJ SOLN: 4.0000 mg | Freq: Three times a day (TID) | INTRAMUSCULAR | Status: DC | PRN

## 2016-07-20 MED ORDER — LACTATED RINGERS IV SOLN
Freq: Once | INTRAVENOUS | Status: AC
  Administered 2016-07-20: 11:00:00 via INTRAVENOUS

## 2016-07-20 MED ORDER — TETANUS-DIPHTH-ACELL PERTUSSIS 5-2.5-18.5 LF-MCG/0.5 IM SUSP: 0.5000 mL | Freq: Once | INTRAMUSCULAR | Status: DC

## 2016-07-20 MED ORDER — CEFAZOLIN SODIUM-DEXTROSE 2-3 GM-% IV SOLR
INTRAVENOUS | Status: DC | PRN
  Administered 2016-07-20: 2 g via INTRAVENOUS

## 2016-07-20 MED ORDER — DEXAMETHASONE SODIUM PHOSPHATE 4 MG/ML IJ SOLN
INTRAMUSCULAR | Status: AC
  Filled 2016-07-20: qty 1

## 2016-07-20 MED ORDER — NALOXONE HCL 0.4 MG/ML IJ SOLN
0.4000 mg | INTRAMUSCULAR | Status: DC | PRN
Start: 2016-07-20 — End: 2016-07-20

## 2016-07-20 MED ORDER — NALOXONE HCL 2 MG/2ML IJ SOSY: 1.0000 ug/kg/h | PREFILLED_SYRINGE | INTRAVENOUS | Status: DC | PRN

## 2016-07-20 MED ORDER — DEXAMETHASONE SODIUM PHOSPHATE 4 MG/ML IJ SOLN
INTRAMUSCULAR | Status: DC | PRN
  Administered 2016-07-20: 4 mg via INTRAVENOUS

## 2016-07-20 MED ORDER — SENNOSIDES-DOCUSATE SODIUM 8.6-50 MG PO TABS
2.0000 | ORAL_TABLET | ORAL | Status: DC
  Administered 2016-07-21 – 2016-07-23 (×3): 2 via ORAL
  Filled 2016-07-20 (×3): qty 2

## 2016-07-20 MED ORDER — SODIUM CHLORIDE 0.9% FLUSH: 3.0000 mL | INTRAVENOUS | Status: DC | PRN

## 2016-07-20 MED ORDER — OXYCODONE HCL 5 MG PO TABS
5.0000 mg | ORAL_TABLET | ORAL | Status: DC | PRN
  Administered 2016-07-21 – 2016-07-22 (×3): 5 mg via ORAL
  Filled 2016-07-20 (×3): qty 1

## 2016-07-20 MED ORDER — SCOPOLAMINE 1 MG/3DAYS TD PT72: 1.0000 | MEDICATED_PATCH | Freq: Once | TRANSDERMAL | Status: DC

## 2016-07-20 MED ORDER — WITCH HAZEL-GLYCERIN EX PADS: 1.0000 "application " | MEDICATED_PAD | CUTANEOUS | Status: DC | PRN

## 2016-07-20 MED ORDER — OXYCODONE-ACETAMINOPHEN 5-325 MG PO TABS
ORAL_TABLET | ORAL | Status: AC
  Filled 2016-07-20: qty 1

## 2016-07-20 MED ORDER — DIPHENHYDRAMINE HCL 25 MG PO CAPS
25.0000 mg | ORAL_CAPSULE | Freq: Four times a day (QID) | ORAL | Status: DC | PRN
  Administered 2016-07-20: 25 mg via ORAL
  Filled 2016-07-20: qty 1

## 2016-07-20 MED ORDER — ONDANSETRON HCL 4 MG/2ML IJ SOLN
INTRAMUSCULAR | Status: AC
  Filled 2016-07-20: qty 2

## 2016-07-20 MED ORDER — SCOPOLAMINE 1 MG/3DAYS TD PT72
MEDICATED_PATCH | TRANSDERMAL | Status: AC
  Administered 2016-07-20: 1.5 mg via TRANSDERMAL
  Filled 2016-07-20: qty 1

## 2016-07-20 MED ORDER — NALBUPHINE HCL 10 MG/ML IJ SOLN
5.0000 mg | INTRAMUSCULAR | Status: DC | PRN
Start: 2016-07-20 — End: 2016-07-20

## 2016-07-20 MED ORDER — LACTATED RINGERS IV SOLN
INTRAVENOUS | Status: DC
  Administered 2016-07-20 (×2): via INTRAVENOUS

## 2016-07-20 MED ORDER — OXYCODONE HCL 5 MG PO TABS: 10.0000 mg | ORAL_TABLET | ORAL | Status: DC | PRN

## 2016-07-20 MED ORDER — MORPHINE SULFATE (PF) 0.5 MG/ML IJ SOLN
INTRAMUSCULAR | Status: AC
  Filled 2016-07-20: qty 10

## 2016-07-20 MED ORDER — BUPIVACAINE 0.5 % ON-Q PUMP SINGLE CATH 300 ML
300.0000 mL | INJECTION | Status: DC
  Filled 2016-07-20: qty 300

## 2016-07-20 MED ORDER — IBUPROFEN 600 MG PO TABS
600.0000 mg | ORAL_TABLET | Freq: Four times a day (QID) | ORAL | Status: DC
  Administered 2016-07-20 – 2016-07-23 (×11): 600 mg via ORAL
  Filled 2016-07-20 (×12): qty 1

## 2016-07-20 MED ORDER — KETOROLAC TROMETHAMINE 30 MG/ML IJ SOLN
INTRAMUSCULAR | Status: AC
  Filled 2016-07-20: qty 1

## 2016-07-20 MED ORDER — EPHEDRINE 5 MG/ML INJ
INTRAVENOUS | Status: AC
  Filled 2016-07-20: qty 10

## 2016-07-20 MED ORDER — DEXTROSE 5 % IV SOLN
1.0000 ug/kg/h | INTRAVENOUS | Status: DC | PRN
  Filled 2016-07-20: qty 2

## 2016-07-20 MED ORDER — SIMETHICONE 80 MG PO CHEW: 80.0000 mg | CHEWABLE_TABLET | ORAL | Status: DC | PRN

## 2016-07-20 MED ORDER — PHENYLEPHRINE 8 MG IN D5W 100 ML (0.08MG/ML) PREMIX OPTIME
INJECTION | INTRAVENOUS | Status: AC
  Filled 2016-07-20: qty 100

## 2016-07-20 MED ORDER — SCOPOLAMINE 1 MG/3DAYS TD PT72
1.0000 | MEDICATED_PATCH | Freq: Once | TRANSDERMAL | Status: DC
  Administered 2016-07-20: 1.5 mg via TRANSDERMAL

## 2016-07-20 MED ORDER — ONDANSETRON HCL 4 MG/2ML IJ SOLN
INTRAMUSCULAR | Status: DC | PRN
  Administered 2016-07-20: 4 mg via INTRAVENOUS

## 2016-07-20 MED ORDER — OXYCODONE-ACETAMINOPHEN 5-325 MG PO TABS
1.0000 | ORAL_TABLET | Freq: Once | ORAL | Status: AC
  Administered 2016-07-20: 1 via ORAL

## 2016-07-20 MED ORDER — PHENYLEPHRINE 8 MG IN D5W 100 ML (0.08MG/ML) PREMIX OPTIME
INJECTION | INTRAVENOUS | Status: DC | PRN
  Administered 2016-07-20: 60 ug/min via INTRAVENOUS

## 2016-07-20 MED ORDER — BUPIVACAINE HCL (PF) 0.5 % IJ SOLN
INTRAMUSCULAR | Status: DC | PRN
  Administered 2016-07-20: 30 mL

## 2016-07-20 MED ORDER — MENTHOL 3 MG MT LOZG: 1.0000 | LOZENGE | OROMUCOSAL | Status: DC | PRN

## 2016-07-20 MED ORDER — DIPHENHYDRAMINE HCL 50 MG/ML IJ SOLN
12.5000 mg | INTRAMUSCULAR | Status: DC | PRN
Start: 2016-07-20 — End: 2016-07-20

## 2016-07-20 MED ORDER — SIMETHICONE 80 MG PO CHEW
80.0000 mg | CHEWABLE_TABLET | Freq: Three times a day (TID) | ORAL | Status: DC
  Administered 2016-07-21 – 2016-07-23 (×8): 80 mg via ORAL
  Filled 2016-07-20 (×8): qty 1

## 2016-07-20 MED ORDER — LACTATED RINGERS IV SOLN
INTRAVENOUS | Status: DC
  Administered 2016-07-21: via INTRAVENOUS

## 2016-07-20 MED ORDER — OXYTOCIN 10 UNIT/ML IJ SOLN
INTRAMUSCULAR | Status: AC
  Filled 2016-07-20: qty 4

## 2016-07-20 MED ORDER — ACETAMINOPHEN 325 MG PO TABS
650.0000 mg | ORAL_TABLET | ORAL | Status: DC | PRN
  Administered 2016-07-20 – 2016-07-23 (×3): 650 mg via ORAL
  Filled 2016-07-20 (×3): qty 2

## 2016-07-20 MED ORDER — FENTANYL CITRATE (PF) 100 MCG/2ML IJ SOLN
INTRAMUSCULAR | Status: DC | PRN
  Administered 2016-07-20: 10 ug via INTRATHECAL

## 2016-07-20 MED ORDER — BUPIVACAINE IN DEXTROSE 0.75-8.25 % IT SOLN
INTRATHECAL | Status: DC | PRN
  Administered 2016-07-20: 1.6 mL via INTRATHECAL

## 2016-07-20 MED FILL — Bupivacaine HCl Preservative Free (PF) Inj 0.5%: INTRAMUSCULAR | Qty: 30 | Status: AC

## 2016-07-20 SURGICAL SUPPLY — 37 items
BENZOIN TINCTURE PRP APPL 2/3 (GAUZE/BANDAGES/DRESSINGS) ×3 IMPLANT
CHLORAPREP W/TINT 26ML (MISCELLANEOUS) ×3 IMPLANT
CLAMP CORD UMBIL (MISCELLANEOUS) IMPLANT
CLOSURE STERI STRIP 1/2 X4 (GAUZE/BANDAGES/DRESSINGS) ×3 IMPLANT
CLOTH BEACON ORANGE TIMEOUT ST (SAFETY) ×3 IMPLANT
DECANTER SPIKE VIAL GLASS SM (MISCELLANEOUS) ×3 IMPLANT
DRSG OPSITE POSTOP 4X10 (GAUZE/BANDAGES/DRESSINGS) ×3 IMPLANT
ELECT REM PT RETURN 9FT ADLT (ELECTROSURGICAL) ×3
ELECTRODE REM PT RTRN 9FT ADLT (ELECTROSURGICAL) ×1 IMPLANT
EXTRACTOR VACUUM KIWI (MISCELLANEOUS) ×3 IMPLANT
EXTRACTOR VACUUM M CUP 4 TUBE (SUCTIONS) ×2 IMPLANT
EXTRACTOR VACUUM M CUP 4' TUBE (SUCTIONS) ×1
GAUZE SPONGE 4X4 12PLY STRL (GAUZE/BANDAGES/DRESSINGS) ×3 IMPLANT
GLOVE BIOGEL PI IND STRL 7.0 (GLOVE) ×2 IMPLANT
GLOVE BIOGEL PI IND STRL 7.5 (GLOVE) ×2 IMPLANT
GLOVE BIOGEL PI INDICATOR 7.0 (GLOVE) ×4
GLOVE BIOGEL PI INDICATOR 7.5 (GLOVE) ×4
GLOVE ECLIPSE 7.5 STRL STRAW (GLOVE) ×3 IMPLANT
GOWN STRL REUS W/TWL LRG LVL3 (GOWN DISPOSABLE) ×9 IMPLANT
KIT ABG SYR 3ML LUER SLIP (SYRINGE) IMPLANT
LIQUID BAND (GAUZE/BANDAGES/DRESSINGS) ×3 IMPLANT
NEEDLE HYPO 25X5/8 SAFETYGLIDE (NEEDLE) IMPLANT
NS IRRIG 1000ML POUR BTL (IV SOLUTION) ×3 IMPLANT
PACK C SECTION WH (CUSTOM PROCEDURE TRAY) ×3 IMPLANT
PAD ABD 7.5X8 STRL (GAUZE/BANDAGES/DRESSINGS) ×3 IMPLANT
PAD OB MATERNITY 4.3X12.25 (PERSONAL CARE ITEMS) ×3 IMPLANT
PAIN PUMP ON-Q 270MLX4ML 5IN (PAIN MANAGEMENT) ×3 IMPLANT
PENCIL SMOKE EVAC W/HOLSTER (ELECTROSURGICAL) ×3 IMPLANT
RTRCTR C-SECT PINK 25CM LRG (MISCELLANEOUS) ×3 IMPLANT
STRIP CLOSURE SKIN 1/2X4 (GAUZE/BANDAGES/DRESSINGS) ×2 IMPLANT
SUT VIC AB 0 CTX 36 (SUTURE) ×6
SUT VIC AB 0 CTX36XBRD ANBCTRL (SUTURE) ×3 IMPLANT
SUT VIC AB 2-0 CT1 27 (SUTURE) ×2
SUT VIC AB 2-0 CT1 TAPERPNT 27 (SUTURE) ×1 IMPLANT
SUT VIC AB 4-0 KS 27 (SUTURE) ×3 IMPLANT
TOWEL OR 17X24 6PK STRL BLUE (TOWEL DISPOSABLE) ×3 IMPLANT
TRAY FOLEY CATH SILVER 14FR (SET/KITS/TRAYS/PACK) ×3 IMPLANT

## 2016-07-20 NOTE — Anesthesia Procedure Notes (Signed)
Spinal  Staffing Anesthesiologist: Suzette Battiest Performed: anesthesiologist  Preanesthetic Checklist Completed: patient identified, site marked, surgical consent, pre-op evaluation, timeout performed, IV checked, risks and benefits discussed and monitors and equipment checked Spinal Block Patient position: sitting Prep: site prepped and draped and DuraPrep Patient monitoring: heart rate, continuous pulse ox and blood pressure Approach: midline Location: L4-5 Injection technique: single-shot Needle Needle type: Pencan  Needle gauge: 24 G Needle length: 9 cm Assessment Sensory level: T6

## 2016-07-20 NOTE — Anesthesia Preprocedure Evaluation (Signed)
Anesthesia Evaluation  Patient identified by MRN, date of birth, ID band Patient awake    Reviewed: Allergy & Precautions, NPO status , Patient's Chart, lab work & pertinent test results  Airway Mallampati: II  TM Distance: >3 FB     Dental   Pulmonary neg pulmonary ROS,    breath sounds clear to auscultation       Cardiovascular hypertension,  Rhythm:Regular Rate:Normal     Neuro/Psych negative neurological ROS     GI/Hepatic negative GI ROS, Neg liver ROS,   Endo/Other  negative endocrine ROS  Renal/GU negative Renal ROS     Musculoskeletal   Abdominal   Peds  Hematology negative hematology ROS (+)   Anesthesia Other Findings   Reproductive/Obstetrics (+) Pregnancy Repeat c/section for previous and polyhydramnios                             Lab Results  Component Value Date   WBC 5.7 07/17/2016   HGB 13.8 07/17/2016   HCT 40.0 07/17/2016   MCV 83.2 07/17/2016   PLT 199 07/17/2016   Lab Results  Component Value Date   CREATININE 0.77 02/13/2015   BUN 11 02/13/2015   NA 137 02/13/2015   K 4.1 02/13/2015   CL 104 02/13/2015   CO2 24 02/13/2015    Anesthesia Physical Anesthesia Plan  ASA: II  Anesthesia Plan: Spinal   Post-op Pain Management:    Induction:   Airway Management Planned: Natural Airway  Additional Equipment:   Intra-op Plan:   Post-operative Plan:   Informed Consent: I have reviewed the patients History and Physical, chart, labs and discussed the procedure including the risks, benefits and alternatives for the proposed anesthesia with the patient or authorized representative who has indicated his/her understanding and acceptance.     Plan Discussed with: CRNA  Anesthesia Plan Comments:         Anesthesia Quick Evaluation

## 2016-07-20 NOTE — H&P (Signed)
Faculty Practice H&P  Anne Murillo is a 30 y.o. female G12P1011 with IUP at [redacted]w[redacted]d presenting for schedule cesarean section for elective repeat and polyhydramnios. Pregnancy was been complicated by polyhydramnios, chronic hypertension and prior cesarean section.    Pt states she has been having no contractions, no vaginal bleeding, intact membranes, with normal fetal movement.     Prenatal Course Source of Care: [facility]  with onset of care at 11 weeks. She transferred care to HR clinic Pregnancy complications or risks: Patient Active Problem List   Diagnosis Date Noted  . Supervision of high risk pregnancy in third trimester 07/06/2016  . Polyhydramnios affecting pregnancy in third trimester 07/06/2016  . UTI in pregnancy 07/06/2016  . History of cesarean delivery 12/05/2013  . Chronic hypertension affecting pregnancy 12/03/2013  . Group B streptococcal infection 11/14/2013  . Language barrier, cultural differences 10/11/2013   She desires to condoms.  She plans to plans to breastfeed  Prenatal labs and studies: ABO, Rh: --/--/O POS (11/03 1130) Antibody: NEG (11/03 1130) Rubella: !Error! RPR: Non Reactive (11/03 1130)  HBsAg:    HIV: Non Reactive (04/10 1018)  GBS:    1 hr Glucola 128 Genetic screeningnormal Anatomy US normal  Past Medical History:  Past Medical History:  Diagnosis Date  . Benign essential HTN   . Irregular heart rhythm 04/24/2013    Past Surgical History:  Past Surgical History:  Procedure Laterality Date  . CESAREAN SECTION N/A 12/05/2013   Procedure: Primary Cesarean Section Delivery Baby Girl @ 0136, Apgars 9/9;  Surgeon: Osborne Oman, MD;  Location: [facility];  Service: Obstetrics;  Laterality: N/A;  . DILATION AND EVACUATION N/A 07/20/2015   Procedure: DILATATION AND EVACUATION;  Surgeon: Bobbye Charleston, MD;  Location: [facility];  Service: Gynecology;  Laterality: N/A;    Obstetrical History:  OB History    Gravida Para Term Preterm AB Living    3 1 1   1 1    SAB TAB Ectopic Multiple Live Births   1       1      Obstetric Comments   11/2013 pLTCS for NRFHT in 1st stage due to lates. 3680gm      Gynecological History:  OB History    Gravida Para Term Preterm AB Living   3 1 1   1 1    SAB TAB Ectopic Multiple Live Births   1       1      Obstetric Comments   11/2013 pLTCS for NRFHT in 1st stage due to lates. 3680gm      Social History:  Social History   Social History  . Marital status: Married    Spouse name: N/A  . Number of children: N/A  . Years of education: N/A   Social History Main Topics  . Smoking status: Never Smoker  . Smokeless tobacco: Never Used  . Alcohol use No  . Drug use: No  . Sexual activity: Not Currently    Birth control/ protection: None   Other Topics Concern  . None   Social History Narrative  . None    Family History: History reviewed. No pertinent family history.  Medications:  Prenatal vitamins,  Current Facility-Administered Medications  Medication Dose Route Frequency Provider Last Rate Last Dose  . lactated ringers infusion   Intravenous Continuous Suzette Battiest, MD      . scopolamine (TRANSDERM-SCOP) 1 MG/3DAYS 1.5 mg  1 patch Transdermal Once Suzette Battiest, MD   1.5 mg at  07/20/16 1115    Allergies: No Known Allergies  Review of Systems: - negative  Physical Exam: Blood pressure 127/86, pulse (!) 104, temperature 99 F (37.2 C), temperature source Oral, resp. rate 16, last menstrual period 10/20/2015, SpO2 99 %. GENERAL: Well-developed, well-nourished female in no acute distress.  LUNGS: Clear to auscultation bilaterally.  HEART: Regular rate and rhythm. ABDOMEN: Soft, nontender, nondistended, gravid. EFW 8 lbs EXTREMITIES: Nontender, no edema, 2+ distal pulses. FHT:  Baseline rate 147 bpm     Pertinent Labs/Studies:   Lab Results  Component Value Date   WBC 5.7 07/17/2016   HGB 13.8 07/17/2016   HCT 40.0 07/17/2016   MCV 83.2 07/17/2016   PLT  199 07/17/2016     Assessment : Anne Murillo is a 30 y.o. G3P1011 at [redacted]w[redacted]d being admitted for cesarean section secondary to elective repeat, polyhydramnios, chronic hypertension  Plan: The risks of cesarean section discussed with the patient included but were not limited to: bleeding which may require transfusion or reoperation; infection which may require antibiotics; injury to bowel, bladder, ureters or other surrounding organs; injury to the fetus; need for additional procedures including hysterectomy in the event of a life-threatening hemorrhage; placental abnormalities wth subsequent pregnancies, incisional problems, thromboembolic phenomenon and other postoperative/anesthesia complications. The patient concurred with the proposed plan, giving informed written consent for the procedure.   Patient has been NPO since last night and will remain NPO for procedure.  Preoperative prophylactic Ancef ordered on call to the OR.    Truett Mainland, DO 07/20/2016, 11:59 AM

## 2016-07-20 NOTE — Anesthesia Postprocedure Evaluation (Signed)
Anesthesia Post Note  Patient: Anne Murillo  Procedure(s) Performed: Procedure(s) (LRB): CESAREAN SECTION (N/A)  Patient location during evaluation: PACU Anesthesia Type: Spinal and MAC Level of consciousness: awake and alert Pain management: pain level controlled Vital Signs Assessment: post-procedure vital signs reviewed and stable Respiratory status: spontaneous breathing and respiratory function stable Cardiovascular status: blood pressure returned to baseline and stable Postop Assessment: spinal receding Anesthetic complications: no     Last Vitals:  Vitals:   07/20/16 1551 07/20/16 1702  BP: 117/69 (!) 127/57  Pulse: (!) 57 (!) 57  Resp: 16   Temp: 36.6 C 37 C    Last Pain:  Vitals:   07/20/16 1702  TempSrc: Oral  PainSc:    Pain Goal:                 Tiajuana Amass

## 2016-07-20 NOTE — Op Note (Signed)
Anne Murillo PROCEDURE DATE: 07/20/2016  PREOPERATIVE DIAGNOSIS: Intrauterine pregnancy at  [redacted]w[redacted]d weeks gestation; patient declines vag del attempt  POSTOPERATIVE DIAGNOSIS: The same  PROCEDURE: Low Transverse Cesarean Section  SURGEON:  Dr. Loma Boston                       Dr. Jacquiline Doe Fellow  INDICATIONS: Anne Murillo is a 30 y.o. [id number] at [redacted]w[redacted]d scheduled for cesarean section secondary to patient declines vag del attempt.  The risks of cesarean section discussed with the patient included but were not limited to: bleeding which may require transfusion or reoperation; infection which may require antibiotics; injury to bowel, bladder, ureters or other surrounding organs; injury to the fetus; need for additional procedures including hysterectomy in the event of a life-threatening hemorrhage; placental abnormalities wth subsequent pregnancies, incisional problems, thromboembolic phenomenon and other postoperative/anesthesia complications. The patient concurred with the proposed plan, giving informed written consent for the procedure.    FINDINGS:  Viable Female infant in OP presentation.  Apgars 6 and 9, weight, 9 pounds and 1 ounces.  Clear amniotic fluid.  Intact placenta, three vessel cord.  Normal uterus, fallopian tubes and ovaries bilaterally.  ANESTHESIA:    Spinal ESTIMATED BLOOD LOSS: 925 ml SPECIMENS: Placenta sent to L&D COMPLICATIONS: None immediate  PROCEDURE IN DETAIL:  The patient received intravenous antibiotics and had sequential compression devices applied to her lower extremities while in the preoperative area.  She was then taken to the operating room where spinal anesthesia was administered (epidural anesthesia was dosed up to surgical level) and was found to be adequate. She was then placed in a dorsal supine position with a leftward tilt, and prepped and draped in a sterile manner.  A foley catheter was placed into her bladder and attached to constant  gravity, which drained clear fluid throughout.  After an adequate timeout was performed, a Pfannenstiel skin incision was made with scalpel and carried through to the underlying layer of fascia. The fascia was incised in the midline and this incision was extended bilaterally using the Mayo scissors. Kocher clamps were applied to the superior aspect of the fascial incision and the underlying rectus muscles were dissected off bluntly. A similar process was carried out on the inferior aspect of the facial incision. The rectus muscles were separated in the midline bluntly and the peritoneum was entered bluntly.  The rectus muscles where incised left of the midline at the most superior aspect. An Alexis retractor was placed to aid in visualization of the uterus.  Attention was turned to the lower uterine segment where a transverse hysterotomy was made with a scalpel and extended bilaterally bluntly. The infant was noted to be in the OP position and was difficult to delivery. The Alexis retractor was removed and the rectus incision on the left was extended with first with the bovie and the with mayo scissors. The uterine incision was extended with mayo scissors on the right. A vacuum was placed on the fecal scalp and added with delivery. There where two pop offs. The infant was successfully delivered, and cord was clamped and cut and infant was handed over to awaiting neonatology team. Uterine massage was then administered and the placenta delivered intact with three-vessel cord. The uterus was then cleared of clot and debris.  The hysterotomy was closed with 0 Vicryl in a running locked fashion, and an imbricating layer was also placed with a 0 Vicryl. A figure of eight was placed to obtain  adequate hemostasis. The abdomen and the pelvis were cleared of all clot and debris Hemostasis was confirmed on all surfaces.  The peritoneum was reapproximated using 2-0 vicryl running stitches. The catheter for an On-Q pump was  primed and inserted into the skin and fascia approximately 5cm from the skin incision on the right corner.  This was laid across the rectus muscles. The fascia was then closed using 0 Vicryl in a running fashion.  The On-Q catheter was retracted 1 cm to ensure that it was not entrapped.   The subcutaneous layer was reapproximated with plain gut and the skin was closed with 4-0 vicryl. The patient tolerated the procedure well. Sponge, lap, instrument and needle counts were correct x 2. She was taken to the recovery room in stable condition.    Waldemar Dickens, MD 07/20/2016 4:11 PM

## 2016-07-20 NOTE — Transfer of Care (Signed)
Immediate Anesthesia Transfer of Care Note  Patient: Anne Murillo  Procedure(s) Performed: Procedure(s): CESAREAN SECTION (N/A)  Patient Location: PACU  Anesthesia Type:Spinal  Level of Consciousness: awake, alert , oriented and patient cooperative  Airway & Oxygen Therapy: Patient Spontanous Breathing  Post-op Assessment: Report given to RN and Post -op Vital signs reviewed and stable  Post vital signs: Reviewed and stable  Last Vitals:  TEMP 97.7 BP 124/85 HR 63 RR 20 POX 100  Last Pain: 0 Pain goal: 4       Complications: No apparent anesthesia complications

## 2016-07-21 DIAGNOSIS — Z98891 History of uterine scar from previous surgery: Secondary | ICD-10-CM

## 2016-07-21 LAB — CBC
HCT: 34.2 % — ABNORMAL LOW (ref 36.0–46.0)
HEMOGLOBIN: 11.7 g/dL — AB (ref 12.0–15.0)
MCH: 28.4 pg (ref 26.0–34.0)
MCHC: 34.2 g/dL (ref 30.0–36.0)
MCV: 83 fL (ref 78.0–100.0)
PLATELETS: 169 10*3/uL (ref 150–400)
RBC: 4.12 MIL/uL (ref 3.87–5.11)
RDW: 15.2 % (ref 11.5–15.5)
WBC: 12.9 10*3/uL — ABNORMAL HIGH (ref 4.0–10.5)

## 2016-07-21 LAB — BIRTH TISSUE RECOVERY COLLECTION (PLACENTA DONATION)

## 2016-07-21 MED ORDER — NALBUPHINE HCL 10 MG/ML IJ SOLN
5.0000 mg | INTRAMUSCULAR | Status: DC | PRN
  Administered 2016-07-21 (×2): 5 mg via SUBCUTANEOUS
  Filled 2016-07-21 (×2): qty 1

## 2016-07-21 NOTE — Addendum Note (Signed)
Addendum  created 07/21/16 [id number] by Georgeanne Nim, CRNA   Sign clinical note

## 2016-07-21 NOTE — Progress Notes (Signed)
Subjective: Postpartum Day 1: Cesarean Delivery Patient reports Minimal incision pain. Able to ambulate w/ RN. Denies dizziness.   Objective: Vital signs in last 24 hours: Temp:  [97.8 F (36.6 C)-99.6 F (37.6 C)] 98.3 F (36.8 C) (11/07 0533) Pulse Rate:  [51-104] 62 (11/07 0533) Resp:  [12-22] 20 (11/07 0533) BP: (101-127)/(57-86) 101/59 (11/07 0533) SpO2:  [97 %-100 %] 98 % (11/07 0533)  Physical Exam:  General: alert, cooperative, appears older than stated age, no distress and moderate distress Lochia: appropriate Uterine Fundus: firm Incision: no significant drainage, honeycomb dressing w/ On-Q pump in place.  DVT Evaluation: No evidence of DVT seen on physical exam.   Recent Labs  07/21/16 0607  HGB 11.7*  HCT 34.2*    Assessment/Plan: Status post Cesarean section. Doing well postoperatively.  Continue current care.  Anne Murillo 07/21/2016, 9:24 AM

## 2016-07-21 NOTE — Anesthesia Postprocedure Evaluation (Signed)
Anesthesia Post Note  Patient: Anne Murillo  Procedure(s) Performed: Procedure(s) (LRB): CESAREAN SECTION (N/A)  Patient location during evaluation: Mother Baby Anesthesia Type: Spinal Level of consciousness: awake Pain management: pain level controlled Vital Signs Assessment: post-procedure vital signs reviewed and stable Respiratory status: spontaneous breathing Cardiovascular status: stable Postop Assessment: no headache, no backache, spinal receding and patient able to bend at knees Anesthetic complications: no     Last Vitals:  Vitals:   07/21/16 0200 07/21/16 0533  BP: 109/67 (!) 101/59  Pulse: 77 62  Resp: 18 20  Temp: 36.7 C 36.8 C    Last Pain:  Vitals:   07/21/16 0531  TempSrc:   PainSc: 0-No pain   Pain Goal:                 Everette Rank

## 2016-07-21 NOTE — Lactation Note (Signed)
This note was copied from a baby's chart. Lactation Consultation Note Experienced BF mom has 32 yr old that she BF for 1 yr. Mom is bf/formula feed. [name] translating for mom. She understands a little [place]. Mom states she has no milk and will give formula until her milk comes in. Educated on supply and demand. Newborn behavior and feeding habits discussed. Encouraged I&O, STS. Mom holding baby swaddles, hat, and outfit on. Encouraged to remove for BF.  Watched a latch. Mom attempted in cradle position. Baby latch, but no suckle. Taught cross cradle giving more support and control over baby and breast. Baby not interested in BF at this time.  Hand expression demonstrated w/no colostrum see at this time to pendulum everted nipples and soft breast. Areola and nipple tissue appears to be slightly tough. Mom encouraged to feed baby 8-12 times/24 hours and with feeding cues. Reviewed Baby & Me book's Breastfeeding Basics. [place] brochure given w/resources, support groups and [facility] services. Patient Name: Anne Murillo Today's Date: 07/21/2016 Reason for consult: Initial assessment   Maternal Data Has patient been taught Hand Expression?: Yes Does the patient have breastfeeding experience prior to this delivery?: Yes  Feeding Feeding Type: Breast Fed Nipple Type: Slow - flow Length of feed: 5 min  LATCH Score/Interventions Latch: Repeated attempts needed to sustain latch, nipple held in mouth throughout feeding, stimulation needed to elicit sucking reflex. Intervention(s): Adjust position;Assist with latch;Breast massage;Breast compression  Audible Swallowing: None Intervention(s): Alternate breast massage;Hand expression  Type of Nipple: Everted at rest and after stimulation  Comfort (Breast/Nipple): Soft / non-tender     Hold (Positioning): Assistance needed to correctly position infant at breast and maintain latch. Intervention(s): Breastfeeding basics reviewed;Support  Pillows;Position options;Skin to skin  LATCH Score: 6  Lactation Tools Discussed/Used     Consult Status Consult Status: Follow-up Date: 07/21/16 (in pm) Follow-up type: In-patient    [name], Elta [place] 07/21/2016, 7:01 AM

## 2016-07-22 ENCOUNTER — Inpatient Hospital Stay (HOSPITAL_COMMUNITY): Payer: Medicaid Other

## 2016-07-22 DIAGNOSIS — M79609 Pain in unspecified limb: Secondary | ICD-10-CM

## 2016-07-22 MED ORDER — GLYCERIN (LAXATIVE) 2.1 G RE SUPP
1.0000 | Freq: Once | RECTAL | Status: DC
  Filled 2016-07-22: qty 1

## 2016-07-22 NOTE — Progress Notes (Signed)
VASCULAR LAB PRELIMINARY  PRELIMINARY  PRELIMINARY  PRELIMINARY  Bilateral lower extremity venous duplex completed.    Preliminary report:  Bilateral:  No evidence of DVT, superficial thrombosis, or Baker's Cyst.   Darris Staiger, RVS 07/22/2016, 11:08 AM

## 2016-07-22 NOTE — Progress Notes (Signed)
On-Q pain pump removed per protocol. Black tip intact on the end of the catheter. Placed band-aid over incision site. Clean, dry, and intact. Will continue to monitor.

## 2016-07-22 NOTE — Lactation Note (Signed)
This note was copied from a baby's chart. Lactation Consultation Note: Mother assist to sit in chair at the bedside. Infant was placed in football cradle. Infant latched on and off for 10-15 mins .  Mother's milk is coming in. Mother taught hand expression.  Mother advised to continue to feed infant with a feeding cues and at least 8-12 times .   Patient Name: Anne Murillo Today's Date: 07/22/2016     Maternal Data    Feeding Feeding Type: Breast Fed  LATCH Score/Interventions Latch: Grasps breast easily, tongue down, lips flanged, rhythmical sucking.  Audible Swallowing: A few with stimulation  Type of Nipple: Everted at rest and after stimulation  Comfort (Breast/Nipple): Soft / non-tender     Hold (Positioning): Assistance needed to correctly position infant at breast and maintain latch.  LATCH Score: 8  Lactation Tools Discussed/Used     Consult Status      Darla Lesches 07/22/2016, 12:24 PM

## 2016-07-22 NOTE — Lactation Note (Signed)
This note was copied from a baby's chart. Lactation Consultation Note: Mother thinks that she doesn't have milk yet. She has been mostly bottle feeding. Assessed mother's breast and found that mother 's milk is coming in. Advised mother to page for next feeding. Advised mother to massage and ice breast. Showed mother how full breast were. Advised mother to pump with hand pump. I will follow up with mother for next feeding. Infant just had formula with a bottle.   Patient Name: Anne Murillo Today's Date: 07/22/2016     Maternal Data    Feeding    LATCH Score/Interventions                      Lactation Tools Discussed/Used     Consult Status      Darla Lesches 07/22/2016, 10:55 AM

## 2016-07-22 NOTE — Progress Notes (Signed)
Patient ID: Latonia Estela, female   DOB: 14-Mar-1986, 30 y.o.   MRN: [id number]  POSTPARTUM PROGRESS NOTE  Post Partum Day  Subjective:  Ronny Mcgrady is a 30 y.o. [id number] [redacted]w[redacted]d s/p repeat LTCS.  No acute events overnight.  Pt denies problems with ambulating, voiding or po intake.  She denies nausea or vomiting.  Pain is moderately controlled.  She has not had flatus. She has not had bowel movement.  Lochia Minimal.   Objective: Blood pressure 110/72, pulse 73, temperature 97.8 F (36.6 C), temperature source Oral, resp. rate 18, last menstrual period 10/20/2015, SpO2 99 %, unknown if currently breastfeeding.  Physical Exam:  General: alert, cooperative and no distress Lochia:normal flow Chest: CTAB Heart: RRR no m/r/g Abdomen: +BS, soft, tender diffusely to palpation Uterine Fundus: firm, below level of umbilicus Incision: dressing clean dry and intact DVT Evaluation: calf tender and swelling on right Extremities: no edema   Recent Labs  07/21/16 0607  HGB 11.7*  HCT 34.2*    Assessment/Plan:  ASSESSMENT: Cherylene Galdamez is a 30 y.o. [id number] [redacted]w[redacted]d s/p rLTCS   Has not passed gas or had a bowel movement LE doppler bilaterally to r/o DVT Plan for discharge tomorrow   LOS: 2 days   Steve Rattler, DO 07/22/2016, 7:49 AM   OB FELLOW POSTPARTUM PROGRESS NOTE ATTESTATION  I have seen and examined this patient and agree with above documentation in the resident's note. Will attempt a suppository. LE to rule out DVT on right leg, she had +calf tenderness and +homan's sign on right leg during my physical exam today.   Katherine Basset, DO OB Fellow

## 2016-07-23 MED ORDER — IBUPROFEN 600 MG PO TABS: 600.0000 mg | ORAL_TABLET | Freq: Four times a day (QID) | ORAL | 0 refills | Status: DC | PRN

## 2016-07-23 MED ORDER — OXYCODONE HCL 5 MG PO TABS: 5.0000 mg | ORAL_TABLET | ORAL | 0 refills | Status: DC | PRN

## 2016-07-23 NOTE — Lactation Note (Signed)
This note was copied from a baby's chart. Lactation Consultation Note  Patient Name: Anne Murillo Today's Date: 07/23/2016 Reason for consult: Follow-up assessment Mom's milk is in and she is no longer giving formula. Baby is BF with feedings on average 10-15 minutes not using nipple shield, Mom is supplementing with EBM. Mom's breasts are soft at this visit, she pumped before last feeding and received 40 ml of breast milk. Mom reports some nipple tenderness with nursing, no breakdown observed. Advised to apply EBM to sore nipples. Engorgement care reviewed with Mom, hand pump given for home use, ice packs given for comfort. Advised Mom baby should be at breast now 8-12 times or more in 24 hours. Would like baby to nurse longer to help Mom regulate milk supply. Mom's nipples are almost flat but very compressible. Advised Mom to pre-pump prior to latching baby to soften/nipple aerola to help with latch. Advised to post pump as needed to comfort due to milk coming in and apply ice packs after each feeding over the next 24 hours. Mom to refer to Baby N Me booklet page 24 for engorgement care, page 25 for breast milk storage guidelines. Advised of OP services. [place] left phone number for Mom to call with next feeding for LC to assess latch/positioning to alleviate sore nipples and to keep baby awake at breast. [name] present and was interpreter for Mom. Mom understands some English.   Maternal Data    Feeding Feeding Type: Breast Milk Length of feed: 20 min  LATCH Score/Interventions                      Lactation Tools Discussed/Used Tools: Pump Breast pump type: Double-Electric Breast Pump   Consult Status Consult Status: Follow-up Date: 07/23/16 Follow-up type: In-patient    Katrine Coho 07/23/2016, 8:49 AM

## 2016-07-23 NOTE — Discharge Summary (Signed)
OB Discharge Summary     Patient Name: Anne Murillo DOB: 1986/05/17 MRN: [id number]  Date of admission: 07/20/2016 Delivering MD: Truett Mainland   Date of discharge: 07/23/2016  Admitting diagnosis: cpt [phone] - Polyhydramnios and REPEAT Intrauterine pregnancy: [redacted]w[redacted]d     Secondary diagnosis:  Principal Problem:   S/P C-section Active Problems:   Status post cesarean delivery  Additional problems: none     Discharge diagnosis: Term Pregnancy Delivered                                                                                                Post partum procedures:none  Augmentation: none  Complications: None  Hospital course:  Sceduled C/S   30 y.o. yo [id number] at [redacted]w[redacted]d was admitted to the hospital 07/20/2016 for scheduled cesarean section with the following indication:Elective Repeat.  Membrane Rupture Time/Date: 12:50 PM ,07/20/2016   Patient delivered a Viable infant.07/20/2016  Details of operation can be found in separate operative note.  Pateint had an uncomplicated postpartum course.  She is ambulating, tolerating a regular diet, passing flatus, and urinating well. Patient is discharged home in stable condition on  07/23/16          Physical exam Vitals:   07/22/16 0628 07/22/16 1839 07/22/16 2300 07/23/16 0534  BP: 110/72 101/76 107/67 105/72  Pulse: 73 92 85 87  Resp: 18 18 20 18   Temp: 97.8 F (36.6 C) 98.3 F (36.8 C) 98.6 F (37 C) 98.2 F (36.8 C)  TempSrc: Oral Oral Oral Oral  SpO2:       General: alert and cooperative Lochia: appropriate Uterine Fundus: firm Incision: honeycomb intact and dry DVT Evaluation: No evidence of DVT seen on physical exam. Labs: Lab Results  Component Value Date   WBC 12.9 (H) 07/21/2016   HGB 11.7 (L) 07/21/2016   HCT 34.2 (L) 07/21/2016   MCV 83.0 07/21/2016   PLT 169 07/21/2016   CMP Latest Ref Rng & Units 02/13/2015  Glucose 70 - 99 mg/dL 85  BUN 6 - 23 mg/dL 11  Creatinine 0.50 - 1.10 mg/dL 0.77   Sodium 135 - 145 mEq/L 137  Potassium 3.5 - 5.3 mEq/L 4.1  Chloride 96 - 112 mEq/L 104  CO2 19 - 32 mEq/L 24  Calcium 8.4 - 10.5 mg/dL 9.5  Total Protein 6.0 - 8.3 g/dL 7.0  Total Bilirubin 0.2 - 1.2 mg/dL 1.0  Alkaline Phos 39 - 117 U/L 58  AST 0 - 37 U/L 20  ALT 0 - 35 U/L 18    Discharge instruction: per After Visit Summary and "Baby and Me Booklet".  After visit meds:    Medication List    STOP taking these medications   docusate sodium 100 MG capsule Commonly known as:  COLACE   glycopyrrolate 2 MG tablet Commonly known as:  ROBINUL   polyethylene glycol powder powder Commonly known as:  GLYCOLAX/MIRALAX   promethazine 25 MG tablet Commonly known as:  PHENERGAN   ranitidine 150 MG tablet Commonly known as:  ZANTAC     TAKE these medications   ibuprofen 600 MG  tablet Commonly known as:  ADVIL,MOTRIN Take 1 tablet (600 mg total) by mouth every 6 (six) hours as needed.   oxyCODONE 5 MG immediate release tablet Commonly known as:  Oxy IR/ROXICODONE Take 1 tablet (5 mg total) by mouth every 4 (four) hours as needed (pain scale 4-7).   PNV PRENATAL PLUS MULTIVITAMIN 27-1 MG Tabs Take 1 tablet by mouth daily.       Diet: routine diet  Activity: Advance as tolerated. Pelvic rest for 6 weeks.   Outpatient follow up:6 weeks Follow up Appt:Future Appointments Date Time Provider [place]  08/31/2016 8:40 AM Luvenia Redden, PA-C [facility]   Follow up Visit:No Follow-up on file.  Postpartum contraception: Condoms  Newborn Data: Live born female  Birth Weight: 9 lb 0.1 oz (4085 g) APGAR: 6, 9  Baby Feeding: Breast Disposition:home with mother   07/23/2016 Serita Grammes, CNM  7:34 AM

## 2016-07-23 NOTE — Lactation Note (Signed)
This note was copied from a baby's chart. Lactation Consultation Note  Patient Name: Anne Murillo Today's Date: 07/23/2016 Reason for consult: Follow-up assessment [name] called for LC to observe latch. Encouraged Mom to BF with baby STS. Mom latched baby independently, demonstrated how to un-tuck lips for baby to obtain more depth. Baby demonstrated good suckling bursts with swallows noted. Mom denied discomfort. Advised Mom to call if she has questions/concerns after d/c home. Advised to be sure baby is at breast frequently to prevent engorgement.   Maternal Data    Feeding Feeding Type: Breast Fed Length of feed: 20 min  LATCH Score/Interventions Latch: Grasps breast easily, tongue down, lips flanged, rhythmical sucking.  Audible Swallowing: Spontaneous and intermittent  Type of Nipple: Everted at rest and after stimulation (short nipple shafts bilateral/compressible)  Comfort (Breast/Nipple): Filling, red/small blisters or bruises, mild/mod discomfort  Problem noted: Filling;Mild/Moderate discomfort (EBM to tender nipples/no breakdown) Interventions (Mild/moderate discomfort): Pre-pump if needed;Hand massage;Hand expression  Hold (Positioning): No assistance needed to correctly position infant at breast. Intervention(s): Breastfeeding basics reviewed;Support Pillows;Position options;Skin to skin  LATCH Score: 9  Lactation Tools Discussed/Used Tools: Pump Breast pump type: Double-Electric Breast Pump   Consult Status Consult Status: Complete Date: 07/23/16 Follow-up type: In-patient    Katrine Coho 07/23/2016, 9:56 AM

## 2016-07-23 NOTE — Discharge Instructions (Signed)
Cesarean Delivery, Care After  Refer to this sheet in the next few weeks. These instructions provide you with information on caring for yourself after your procedure. Your health care provider may also give you specific instructions. Your treatment has been planned according to current medical practices, but problems sometimes occur. Call your health care provider if you have any problems or questions after you go home.  HOME CARE INSTRUCTIONS   Only take over-the-counter or prescription medications as directed by your health care provider.   Do not drink alcohol, especially if you are breastfeeding or taking medication to relieve pain.   Do not chew or smoke tobacco.   Continue to use good perineal care. Good perineal care includes:    Wiping your perineum from front to back.    Keeping your perineum clean.   Check your surgical cut (incision) daily for increased redness, drainage, swelling, or separation of skin.   Clean your incision gently with soap and water every day, and then pat it dry. If your health care provider says it is okay, leave the incision uncovered. Use a bandage (dressing) if the incision is draining fluid or appears irritated. If the adhesive strips across the incision do not fall off within 7 days, carefully peel them off.   Hug a pillow when coughing or sneezing until your incision is healed. This helps to relieve pain.   Do not use tampons or douche until your health care provider says it is okay.   Shower, wash your hair, and take tub baths as directed by your health care provider.   Wear a well-fitting bra that provides breast support.   Limit wearing support panties or control-top hose.   Drink enough fluids to keep your urine clear or pale yellow.   Eat high-fiber foods such as whole grain cereals and breads, brown rice, beans, and fresh fruits and vegetables every day. These foods may help prevent or relieve constipation.   Resume activities such as climbing stairs,  driving, lifting, exercising, or traveling as directed by your health care provider.   Talk to your health care provider about resuming sexual activities. This is dependent upon your risk of infection, your rate of healing, and your comfort and desire to resume sexual activity.   Try to have someone help you with your household activities and your newborn for at least a few days after you leave the hospital.   Rest as much as possible. Try to rest or take a nap when your newborn is sleeping.   Increase your activities gradually.   Keep all of your scheduled postpartum appointments. It is very important to keep your scheduled follow-up appointments. At these appointments, your health care provider will be checking to make sure that you are healing physically and emotionally.  SEEK MEDICAL CARE IF:    You are passing large clots from your vagina. Save any clots to show your health care provider.   You have a foul smelling discharge from your vagina.   You have trouble urinating.   You are urinating frequently.   You have pain when you urinate.   You have a change in your bowel movements.   You have increasing redness, pain, or swelling near your incision.   You have pus draining from your incision.   Your incision is separating.   You have painful, hard, or reddened breasts.   You have a severe headache.   You have blurred vision or see spots.   You feel sad   or depressed.   You have thoughts of hurting yourself or your newborn.   You have questions about your care, the care of your newborn, or medications.   You are dizzy or light-headed.   You have a rash.   You have pain, redness, or swelling at the site of the removed intravenous access (IV) tube.   You have nausea or vomiting.   You stopped breastfeeding and have not had a menstrual period within 12 weeks of stopping.   You are not breastfeeding and have not had a menstrual period within 12 weeks of delivery.   You have a fever.  SEEK  IMMEDIATE MEDICAL CARE IF:   You have persistent pain.   You have chest pain.   You have shortness of breath.   You faint.   You have leg pain.   You have stomach pain.   Your vaginal bleeding saturates 2 or more sanitary pads in 1 hour.  MAKE SURE YOU:    Understand these instructions.   Will watch your condition.   Will get help right away if you are not doing well or get worse.     This information is not intended to replace advice given to you by your health care provider. Make sure you discuss any questions you have with your health care provider.     Document Released: 05/23/2002 Document Revised: 09/21/2014 Document Reviewed: 04/27/2012  Elsevier Interactive Patient Education 2016 [employer]

## 2016-08-04 ENCOUNTER — Inpatient Hospital Stay (HOSPITAL_COMMUNITY)
Admission: AD | Admit: 2016-08-04 | Discharge: 2016-08-06 | DRG: 776 | Disposition: A | Discharge status: Home / Self Care | Payer: Medicaid Other | Source: Ambulatory Visit | Attending: Obstetrics & Gynecology | Admitting: Obstetrics & Gynecology

## 2016-08-04 ENCOUNTER — Encounter (HOSPITAL_COMMUNITY): Payer: Self-pay

## 2016-08-04 DIAGNOSIS — O1495 Unspecified pre-eclampsia, complicating the puerperium: Secondary | ICD-10-CM | POA: Diagnosis present

## 2016-08-04 DIAGNOSIS — O1003 Pre-existing essential hypertension complicating the puerperium: Secondary | ICD-10-CM | POA: Diagnosis present

## 2016-08-04 DIAGNOSIS — O10919 Unspecified pre-existing hypertension complicating pregnancy, unspecified trimester: Secondary | ICD-10-CM | POA: Diagnosis present

## 2016-08-04 DIAGNOSIS — R51 Headache: Secondary | ICD-10-CM | POA: Diagnosis present

## 2016-08-04 DIAGNOSIS — O115 Pre-existing hypertension with pre-eclampsia, complicating the puerperium: Secondary | ICD-10-CM | POA: Diagnosis present

## 2016-08-04 LAB — PROTEIN / CREATININE RATIO, URINE
Creatinine, Urine: 108 mg/dL
PROTEIN CREATININE RATIO: 0.08 mg/mg{creat} (ref 0.00–0.15)
TOTAL PROTEIN, URINE: 9 mg/dL

## 2016-08-04 LAB — CBC
HCT: 38.9 % (ref 36.0–46.0)
HEMOGLOBIN: 13.1 g/dL (ref 12.0–15.0)
MCH: 28.1 pg (ref 26.0–34.0)
MCHC: 33.7 g/dL (ref 30.0–36.0)
MCV: 83.5 fL (ref 78.0–100.0)
PLATELETS: 182 10*3/uL (ref 150–400)
RBC: 4.66 MIL/uL (ref 3.87–5.11)
RDW: 14.3 % (ref 11.5–15.5)
WBC: 5 10*3/uL (ref 4.0–10.5)

## 2016-08-04 LAB — COMPREHENSIVE METABOLIC PANEL
ALBUMIN: 3.6 g/dL (ref 3.5–5.0)
ALK PHOS: 128 U/L — AB (ref 38–126)
ALT: 30 U/L (ref 14–54)
ANION GAP: 6 (ref 5–15)
AST: 24 U/L (ref 15–41)
BUN: 13 mg/dL (ref 6–20)
CHLORIDE: 108 mmol/L (ref 101–111)
CO2: 26 mmol/L (ref 22–32)
Calcium: 9.6 mg/dL (ref 8.9–10.3)
Creatinine, Ser: 0.86 mg/dL (ref 0.44–1.00)
GFR calc non Af Amer: >60 mL/min (ref >60)
GLUCOSE: 93 mg/dL (ref 65–99)
Potassium: 4.2 mmol/L (ref 3.5–5.1)
SODIUM: 140 mmol/L (ref 135–145)
Total Bilirubin: 1.1 mg/dL (ref 0.3–1.2)
Total Protein: 6.7 g/dL (ref 6.5–8.1)

## 2016-08-04 LAB — URINALYSIS, ROUTINE W REFLEX MICROSCOPIC
BILIRUBIN URINE: NEGATIVE
Glucose, UA: NEGATIVE mg/dL
Ketones, ur: NEGATIVE mg/dL
Nitrite: NEGATIVE
PROTEIN: NEGATIVE mg/dL
Specific Gravity, Urine: 1.01 (ref 1.005–1.030)
pH: 5.5 (ref 5.0–8.0)

## 2016-08-04 LAB — URINE MICROSCOPIC-ADD ON

## 2016-08-04 MED ORDER — LABETALOL HCL 5 MG/ML IV SOLN
20.0000 mg | INTRAVENOUS | Status: DC | PRN
Start: 2016-08-04 — End: 2016-08-06

## 2016-08-04 MED ORDER — AMLODIPINE BESYLATE 10 MG PO TABS
10.0000 mg | ORAL_TABLET | Freq: Every day | ORAL | Status: DC
  Administered 2016-08-04 – 2016-08-06 (×3): 10 mg via ORAL
  Filled 2016-08-04 (×4): qty 1

## 2016-08-04 MED ORDER — LABETALOL HCL 5 MG/ML IV SOLN
20.0000 mg | INTRAVENOUS | Status: DC | PRN
  Administered 2016-08-04: 20 mg via INTRAVENOUS
  Filled 2016-08-04: qty 4
  Filled 2016-08-04 (×2): qty 8

## 2016-08-04 MED ORDER — LABETALOL HCL 5 MG/ML IV SOLN
80.0000 mg | Freq: Once | INTRAVENOUS | Status: AC
  Administered 2016-08-04: 80 mg via INTRAVENOUS

## 2016-08-04 MED ORDER — HYDRALAZINE HCL 20 MG/ML IJ SOLN: 5.0000 mg | INTRAMUSCULAR | Status: DC | PRN

## 2016-08-04 MED ORDER — PRENATAL MULTIVITAMIN CH: 1.0000 | ORAL_TABLET | Freq: Every day | ORAL | Status: DC

## 2016-08-04 MED ORDER — PRENATAL MULTIVITAMIN CH
1.0000 | ORAL_TABLET | Freq: Every day | ORAL | Status: DC
  Administered 2016-08-05 – 2016-08-06 (×2): 1 via ORAL
  Filled 2016-08-04 (×2): qty 1

## 2016-08-04 MED ORDER — LACTATED RINGERS IV SOLN
INTRAVENOUS | Status: AC
  Administered 2016-08-04 – 2016-08-05 (×2): via INTRAVENOUS

## 2016-08-04 MED ORDER — MAGNESIUM SULFATE 50 % IJ SOLN
2.0000 g/h | INTRAVENOUS | Status: AC
  Administered 2016-08-04 – 2016-08-05 (×2): 2 g/h via INTRAVENOUS
  Filled 2016-08-04 (×2): qty 80

## 2016-08-04 MED ORDER — ACETAMINOPHEN 325 MG PO TABS
650.0000 mg | ORAL_TABLET | ORAL | Status: DC | PRN
  Administered 2016-08-06: 650 mg via ORAL
  Filled 2016-08-04: qty 2

## 2016-08-04 MED ORDER — OXYCODONE HCL 5 MG PO TABS
5.0000 mg | ORAL_TABLET | ORAL | Status: DC | PRN
  Administered 2016-08-05 – 2016-08-06 (×5): 5 mg via ORAL
  Filled 2016-08-04 (×5): qty 1

## 2016-08-04 MED ORDER — FUROSEMIDE 10 MG/ML IJ SOLN
40.0000 mg | Freq: Once | INTRAMUSCULAR | Status: AC
  Administered 2016-08-04: 40 mg via INTRAVENOUS
  Filled 2016-08-04: qty 4

## 2016-08-04 MED ORDER — MAGNESIUM SULFATE BOLUS VIA INFUSION
4.0000 g | Freq: Once | INTRAVENOUS | Status: AC
  Administered 2016-08-04: 4 g via INTRAVENOUS
  Filled 2016-08-04: qty 500

## 2016-08-04 MED ORDER — OXYCODONE-ACETAMINOPHEN 5-325 MG PO TABS
2.0000 | ORAL_TABLET | Freq: Once | ORAL | Status: AC
  Administered 2016-08-04: 2 via ORAL
  Filled 2016-08-04: qty 2

## 2016-08-04 MED ORDER — HYDRALAZINE HCL 20 MG/ML IJ SOLN
10.0000 mg | Freq: Once | INTRAMUSCULAR | Status: AC | PRN
  Administered 2016-08-04: 10 mg via INTRAVENOUS
  Filled 2016-08-04: qty 1

## 2016-08-04 NOTE — MAU Note (Signed)
Pt presents to MAU with pain in neck, headache and high BP. Headache and neck pain began yesterday. Denies taking any pain medications. Denies any visual disturbances or epigastric pain.

## 2016-08-04 NOTE — H&P (Signed)
History of present illness Patient is a [id number], 2 weeks post repeat Cesarean Section here with HA. She describes as throughout her head and started today. She has not taken any analgesics since yesterday. She denies visual disturbances and epigastric pain. She does reports she's had significant lower extremity swelling since delivery. Lochia is minimal. She report some LE edema. She is breastfeeding. Home health nurse checked BP today and had SBP of 180. She has hx of chronic HTN however was not on any medications postpartum. Her pregnancy was only complicated by polyhydramnios.  MA U course: In the MA U patient had significant hypertension as high as 200/100. Patient was given 20 mg of IV labetalol with no significant improvement. This was followed by 80 mg of IV labetalol without significant improvement again. She was given 10 mg of hydralazine and blood pressure was noted to be 180/90. She will be given 40 mg of Lasix 10 mg of by mouth amlodipine and started on magnesium drip prior to admission.    OB History    Gravida Para Term Preterm AB Living   3 2 2   1 2    SAB TAB Ectopic Multiple Live Births   1     0 2      Obstetric Comments   11/2013 pLTCS for NRFHT in 1st stage due to lates. 3680gm      Past Medical History:  Diagnosis Date  . Benign essential HTN   . Irregular heart rhythm 04/24/2013    Past Surgical History:  Procedure Laterality Date  . CESAREAN SECTION N/A 12/05/2013   Procedure: Primary Cesarean Section Delivery Baby Girl @ 0136, Apgars 9/9;  Surgeon: Osborne Oman, MD;  Location: [facility];  Service: Obstetrics;  Laterality: N/A;  . CESAREAN SECTION N/A 07/20/2016   Procedure: CESAREAN SECTION;  Surgeon: Truett Mainland, DO;  Location: [facility];  Service: Obstetrics;  Laterality: N/A;  . DILATION AND EVACUATION N/A 07/20/2015   Procedure: DILATATION AND EVACUATION;  Surgeon: Bobbye Charleston, MD;  Location: [facility];  Service: Gynecology;  Laterality: N/A;     No family history on file.  Social History  Substance Use Topics  . Smoking status: Never Smoker  . Smokeless tobacco: Never Used  . Alcohol use No    Allergies: No Known Allergies  Prescriptions Prior to Admission  Medication Sig Dispense Refill Last Dose  . ibuprofen (ADVIL,MOTRIN) 600 MG tablet Take 1 tablet (600 mg total) by mouth every 6 (six) hours as needed. 30 tablet 0   . oxyCODONE (OXY IR/ROXICODONE) 5 MG immediate release tablet Take 1 tablet (5 mg total) by mouth every 4 (four) hours as needed (pain scale 4-7). 50 tablet 0   . Prenatal Vit-Fe Fumarate-FA (PNV PRENATAL PLUS MULTIVITAMIN) 27-1 MG TABS Take 1 tablet by mouth daily.   1 Past Week at Unknown time    Review of Systems  Eyes: Negative.   for blurred vision or pain Gastrointestinal: Negative.   for abdominal pain especially in the right upper quadrant constipation or diarrhea Musculoskeletal: Positive for neck pain.  no back pain Neurological: Positive for headaches. , no weakness or focal numbness or tingling. Denies lightheadedness Genitourinary: No dysuria or frequency Obstetric: She denies significant vaginal bleeding  Physical Exam   Blood pressure 177/91, pulse (!) 57, temperature 98.7 F (37.1 C), temperature source Oral, resp. rate 18, last menstrual period 10/20/2015, unknown if currently breastfeeding.  Physical Exam  Constitutional: She is oriented to person, place, and time. She  appears well-developed and well-nourished. No distress.  HENT:  Head: Normocephalic and atraumatic.  Neck: Normal range of motion. Neck supple.  Cardiovascular: Normal rate and regular rhythm.   Respiratory: Effort normal and breath sounds normal.  GI: Soft. She exhibits no distension. There is no tenderness.  Musculoskeletal: Normal range of motion. She exhibits edema (1+ LE).  Neurological: She is alert and oriented to person, place, and time. She has 2+ reflexes.  No clonus  Skin: Skin is warm and dry.   Psychiatric: She has a normal mood and affect.     Assessment and plan: #1: Postpartum preeclampsia proposed on chronic hypertension -labs within normal limits, will repeat in a.m. -Start magnesium 4 g bolus with 2 g an hour plan for 24 hours -Start 10 mg by mouth amlodipine daily. -Monitor I's and O's. -Patient was given 40 mg of Lasix in MA U. Will monitor fluid status #2Postoperative from repeat low transverse C-section: Doing well incision healing well minimal lochia continue prenatal vitamin

## 2016-08-04 NOTE — MAU Provider Note (Signed)
History     CSN: [id number]  Arrival date and time: 08/04/16 1853   None     Chief Complaint  Patient presents with  . Neck Pain  . Headache  . Hypertension   [id number], 2 weeks post repeat Cesarean Section here with HA. She describes as throughout her head and started today. She has not taken any analgesics since yesterday. She denies visual disturbances and epigastric pain. Lochia is minimal. She report some LE edema. She is breastfeeding. Home health nurse checked BP today and had SBP of 180. She has hx of chronic HTN.    OB History    Gravida Para Term Preterm AB Living   3 2 2   1 2    SAB TAB Ectopic Multiple Live Births   1     0 2      Obstetric Comments   11/2013 pLTCS for NRFHT in 1st stage due to lates. 3680gm      Past Medical History:  Diagnosis Date  . Benign essential HTN   . Irregular heart rhythm 04/24/2013    Past Surgical History:  Procedure Laterality Date  . CESAREAN SECTION N/A 12/05/2013   Procedure: Primary Cesarean Section Delivery Baby Girl @ 0136, Apgars 9/9;  Surgeon: Osborne Oman, MD;  Location: [facility];  Service: Obstetrics;  Laterality: N/A;  . CESAREAN SECTION N/A 07/20/2016   Procedure: CESAREAN SECTION;  Surgeon: Truett Mainland, DO;  Location: [place];  Service: Obstetrics;  Laterality: N/A;  . DILATION AND EVACUATION N/A 07/20/2015   Procedure: DILATATION AND EVACUATION;  Surgeon: Bobbye Charleston, MD;  Location: [facility];  Service: Gynecology;  Laterality: N/A;    No family history on file.  Social History  Substance Use Topics  . Smoking status: Never Smoker  . Smokeless tobacco: Never Used  . Alcohol use No    Allergies: No Known Allergies  Prescriptions Prior to Admission  Medication Sig Dispense Refill Last Dose  . ibuprofen (ADVIL,MOTRIN) 600 MG tablet Take 1 tablet (600 mg total) by mouth every 6 (six) hours as needed. 30 tablet 0   . oxyCODONE (OXY IR/ROXICODONE) 5 MG immediate release tablet Take 1 tablet  (5 mg total) by mouth every 4 (four) hours as needed (pain scale 4-7). 50 tablet 0   . Prenatal Vit-Fe Fumarate-FA (PNV PRENATAL PLUS MULTIVITAMIN) 27-1 MG TABS Take 1 tablet by mouth daily.   1 Past Week at Unknown time    Review of Systems  Eyes: Negative.   Gastrointestinal: Negative.   Musculoskeletal: Positive for neck pain.  Neurological: Positive for headaches.   Physical Exam   Blood pressure 177/91, pulse (!) 57, temperature 98.7 F (37.1 C), temperature source Oral, resp. rate 18, last menstrual period 10/20/2015, unknown if currently breastfeeding.  Physical Exam  Constitutional: She is oriented to person, place, and time. She appears well-developed and well-nourished. No distress.  HENT:  Head: Normocephalic and atraumatic.  Neck: Normal range of motion. Neck supple.  Cardiovascular: Normal rate and regular rhythm.   Respiratory: Effort normal and breath sounds normal.  GI: Soft. She exhibits no distension. There is no tenderness.  Musculoskeletal: Normal range of motion. She exhibits edema (1+ LE).  Neurological: She is alert and oriented to person, place, and time. She has normal reflexes.  No clonus  Skin: Skin is warm and dry.  Psychiatric: She has a normal mood and affect.    MAU Course  Procedures Percocet 2 po  Labetalol IV  MDM Labs ordered.  Report and transfer of care given to Baron Sane, [place], CNM  08/04/2016 8:03 PM    Assessment and Plan  1. Severe preeclampsia. Blood pressure has been resistant to IV labetalol and IV hydralazine. Starting magnesium and giving a dose of IV Lasix. Patient will be admitted for magnesium and started on by mouth amlodipine. Labs were all drawn and within normal limits we'll be repeated tomorrow a.m.   Jacquiline Doe M.D.

## 2016-08-05 DIAGNOSIS — O1495 Unspecified pre-eclampsia, complicating the puerperium: Secondary | ICD-10-CM

## 2016-08-05 LAB — COMPREHENSIVE METABOLIC PANEL
ALBUMIN: 3.5 g/dL (ref 3.5–5.0)
ALT: 27 U/L (ref 14–54)
AST: 21 U/L (ref 15–41)
Alkaline Phosphatase: 127 U/L — ABNORMAL HIGH (ref 38–126)
Anion gap: 9 (ref 5–15)
BUN: 10 mg/dL (ref 6–20)
CHLORIDE: 101 mmol/L (ref 101–111)
CO2: 26 mmol/L (ref 22–32)
CREATININE: 0.92 mg/dL (ref 0.44–1.00)
Calcium: 8.7 mg/dL — ABNORMAL LOW (ref 8.9–10.3)
GFR calc Af Amer: >60 mL/min (ref >60)
GLUCOSE: 86 mg/dL (ref 65–99)
POTASSIUM: 3.6 mmol/L (ref 3.5–5.1)
SODIUM: 136 mmol/L (ref 135–145)
Total Bilirubin: 0.9 mg/dL (ref 0.3–1.2)
Total Protein: 6.6 g/dL (ref 6.5–8.1)

## 2016-08-05 LAB — CBC
HEMATOCRIT: 39.5 % (ref 36.0–46.0)
Hemoglobin: 13.3 g/dL (ref 12.0–15.0)
MCH: 27.9 pg (ref 26.0–34.0)
MCHC: 33.7 g/dL (ref 30.0–36.0)
MCV: 82.8 fL (ref 78.0–100.0)
Platelets: 200 10*3/uL (ref 150–400)
RBC: 4.77 MIL/uL (ref 3.87–5.11)
RDW: 14.4 % (ref 11.5–15.5)
WBC: 5.7 10*3/uL (ref 4.0–10.5)

## 2016-08-05 MED ORDER — ENOXAPARIN SODIUM 40 MG/0.4ML ~~LOC~~ SOLN
40.0000 mg | SUBCUTANEOUS | Status: DC
Start: 2016-08-05 — End: 2016-08-06
  Administered 2016-08-05: 40 mg via SUBCUTANEOUS
  Filled 2016-08-05 (×2): qty 0.4

## 2016-08-05 NOTE — Progress Notes (Signed)
[facility] Attending Note   Subjective:  Patient doing well on magnesium sulfate; denies headache, visual changes, RUQ pain.  Baby and husband are in the room with her. No complaints.   Admitted on 08/04/2016 for Preeclampsia in postpartum period.    Objective:  Blood pressure 127/74, pulse 90, temperature 99.4 F (37.4 C), temperature source Oral, resp. rate 18, height 5\' 2"  (1.575 m), weight 182 lb 14.4 oz (83 kg), last menstrual period 10/20/2015, SpO2 97 %, unknown if currently breastfeeding.  Patient Vitals for the past 24 hrs:  BP Temp Temp src Pulse Resp SpO2 Height Weight  08/05/16 0500 127/74 - - 90 18 97 % - -  08/05/16 0400 131/78 - - 86 18 94 % 5\' 2"  (1.575 m) 182 lb 14.4 oz (83 kg)  08/05/16 0300 124/74 - - 84 18 95 % - -  08/05/16 0200 126/69 - - 86 20 94 % - -  08/05/16 0100 128/70 - - 85 18 93 % - -  08/05/16 0000 126/73 - - 90 20 95 % - -  08/04/16 2345 133/67 - - 94 18 95 % - -  08/04/16 2330 139/73 - - 92 18 95 % - -  08/04/16 2315 126/67 - - 90 18 95 % - -  08/04/16 2300 (!) 148/83 - - 88 16 93 % - -  08/04/16 2245 (!) 148/83 - - 84 - 93 % - -  08/04/16 2230 (!) 166/80 - - 82 - 94 % - -  08/04/16 2220 (!) 168/86 - - 80 - 95 % - -  08/04/16 2212 (!) 167/89 - - 73 - 95 % - -  08/04/16 2205 (!) 169/90 99.4 F (37.4 C) Oral 76 16 94 % - -  08/04/16 2140 154/88 - - 75 - - - -  08/04/16 2131 153/88 - - 79 - - - -  08/04/16 2129 164/92 - - 74 - - - -  08/04/16 2110 184/86 - - 78 - - - -  08/04/16 2100 185/92 - - 78 - - - -  08/04/16 2050 180/92 - - 76 - - - -  08/04/16 2040 194/90 - - 80 - - - -  08/04/16 2039 197/96 - - 73 - - - -  08/04/16 2033 (!) 202/97 - - 75 - - - -  08/04/16 2021 194/95 - - 71 - - - -  08/04/16 2017 200/86 - - 66 - - - -  08/04/16 2005 198/89 - - 63 - - - -  08/04/16 1959 (!) 204/96 - - (!) 59 - - - -  08/04/16 1946 199/92 - - 60 - - - -  08/04/16 1930 177/91 - - (!) 57 - - - -  08/04/16 1915 170/86 - - (!) 56 - - - -   08/04/16 1910 147/88 98.7 F (37.1 C) Oral (!) 56 18 - - -    Gen: NAD HENT: Normocephalic, atraumatic Lungs: Normal respiratory effort Heart: Regular rate noted Abdomen: NT, soft Cervix: Deferred Ext: 2+ DTRs, no edema, no cyanosis, negative Homan's sign  Assessment & Plan:  29 y.o. [id number] admitted with CHTN with postpartum severe preeclampsia - Continue magnesium sulfate for 24 hours and continue close observation. - Continue Amlodipine 10 mg daily - Analgesia as needed Routine floor care.   Verita Schneiders, MD, [place] Attending [place], [facility]

## 2016-08-06 LAB — CBC
HCT: 40.8 % (ref 36.0–46.0)
Hemoglobin: 13.9 g/dL (ref 12.0–15.0)
MCH: 28.1 pg (ref 26.0–34.0)
MCHC: 34.1 g/dL (ref 30.0–36.0)
MCV: 82.6 fL (ref 78.0–100.0)
PLATELETS: 212 10*3/uL (ref 150–400)
RBC: 4.94 MIL/uL (ref 3.87–5.11)
RDW: 14.4 % (ref 11.5–15.5)
WBC: 4.2 10*3/uL (ref 4.0–10.5)

## 2016-08-06 LAB — COMPREHENSIVE METABOLIC PANEL
ALT: 26 U/L (ref 14–54)
ANION GAP: 7 (ref 5–15)
AST: 20 U/L (ref 15–41)
Albumin: 3.4 g/dL — ABNORMAL LOW (ref 3.5–5.0)
Alkaline Phosphatase: 121 U/L (ref 38–126)
BUN: 7 mg/dL (ref 6–20)
CHLORIDE: 103 mmol/L (ref 101–111)
CO2: 25 mmol/L (ref 22–32)
Calcium: 7.4 mg/dL — ABNORMAL LOW (ref 8.9–10.3)
Creatinine, Ser: 0.89 mg/dL (ref 0.44–1.00)
Glucose, Bld: 90 mg/dL (ref 65–99)
Potassium: 3.8 mmol/L (ref 3.5–5.1)
SODIUM: 135 mmol/L (ref 135–145)
Total Bilirubin: 1.1 mg/dL (ref 0.3–1.2)
Total Protein: 6.5 g/dL (ref 6.5–8.1)

## 2016-08-06 MED ORDER — AMLODIPINE BESYLATE 10 MG PO TABS: 10.0000 mg | ORAL_TABLET | Freq: Every day | ORAL | 0 refills | Status: DC

## 2016-08-06 MED ORDER — OXYCODONE HCL 5 MG PO TABS: 5.0000 mg | ORAL_TABLET | ORAL | 0 refills | Status: DC | PRN

## 2016-08-06 NOTE — Discharge Summary (Signed)
    OB Discharge Summary     Patient Name: Anne Murillo DOB: August 12, 1986 MRN: [id number]  Date of admission: 08/04/2016 Delivering MD: This patient has no babies on file.  Date of discharge: 08/06/2016  Admitting diagnosis: NOT FEELING GOOD, HBP Intrauterine pregnancy: Unknown     Secondary diagnosis:  Principal Problem:   Preeclampsia in postpartum period Active Problems:   Chronic hypertension affecting pregnancy  Additional problems:      Discharge diagnosis: postpartum preeclampsia                                                                                                Post partum procedures:magnesium sulfate, add Norvasc 10 mg daily  Augmentation:   Complications: None  Hospital course:  Admitted with elevated pressures at 12 days postpartum BP 200/100 Begun on Mag sulfate x 24 hrs with Norvasc 10 started, with normal LFT and platelets. Pressures dramiatically improved, and pt was d/c on evening of 11/23 after 16 hrs off Mag. Physical exam  Vitals:   08/06/16 0800 08/06/16 0907 08/06/16 1027 08/06/16 1204  BP: 131/86 124/71 (!) 84/43 124/81  Pulse: 86 83 85 90  Resp: 18 18 18    Temp: 98.3 F (36.8 C) 99.5 F (37.5 C)  98.9 F (37.2 C)  TempSrc: Oral Oral  Oral  SpO2: 98% 97% 99% 100%  Weight:      Height:       General: alert, cooperative and no distress Lochia: appropriate Uterine Fundus:  Incision: small area of edge assymmetry on right  1 cm in length x 2 mm depth DVT Evaluation: No evidence of DVT seen on physical exam. Labs: Lab Results  Component Value Date   WBC 4.2 08/06/2016   HGB 13.9 08/06/2016   HCT 40.8 08/06/2016   MCV 82.6 08/06/2016   PLT 212 08/06/2016   CMP Latest Ref Rng & Units 08/06/2016  Glucose 65 - 99 mg/dL 90  BUN 6 - 20 mg/dL 7  Creatinine 0.44 - 1.00 mg/dL 0.89  Sodium 135 - 145 mmol/L 135  Potassium 3.5 - 5.1 mmol/L 3.8  Chloride 101 - 111 mmol/L 103  CO2 22 - 32 mmol/L 25  Calcium 8.9 - 10.3 mg/dL 7.4(L)   Total Protein 6.5 - 8.1 g/dL 6.5  Total Bilirubin 0.3 - 1.2 mg/dL 1.1  Alkaline Phos 38 - 126 U/L 121  AST 15 - 41 U/L 20  ALT 14 - 54 U/L 26    Discharge instruction: per After Visit Summary and "Baby and Me Booklet".  After visit meds: norvasc, oxycodone  Diet: routine diet  Activity: Advance as tolerated. Pelvic rest for 6 weeks.   Outpatient follow up:2 weeks  Follow up Appt:Future Appointments Date Time Provider [place]  08/31/2016 8:40 AM Luvenia Redden, PA-C [facility]   Follow up Visit:No Follow-up on file.  Postpartum contraception: Not Discussed  Newborn Data: This patient has no babies on file. Baby Feeding: Bottle and Breast Disposition:home with mother   08/06/2016 Jonnie Kind, MD

## 2016-08-06 NOTE — Progress Notes (Signed)
Pt out with husband teaching complete

## 2016-08-06 NOTE — Discharge Instructions (Signed)
Take your blood pressure medicine once daily Return for severe headache.

## 2016-08-06 NOTE — Progress Notes (Signed)
Subjective: pt is now s/p Mag sulfate and on Norvasc, and BP is BP 124/81 (BP Location: Left Arm)   Pulse 90   Temp 98.9 F (37.2 C) (Oral)   Resp 18   Ht 5\' 2"  (1.575 m)   Wt 82.6 kg (182 lb 0.3 oz)   SpO2 100%   BMI 33.29 kg/m   Patient reports tolerating PO.  She had slight neck discomfort. Will give flexeril for neck discomfort  Objective: I have reviewed patient's vital signs and medications.  General: alert, cooperative, no distress and neck supple full ROM GI: soft, non-tender; bowel sounds normal; no masses,  no organomegaly and incision: clean, dry and one small 1 cm area on right 1/3 of incision is asymmetric and pt given care instructions: antivbiotics and  bandaid. Translations service used [employer] interpreters thru  [employer] interpreter.  Assessment/Plan: Resolved/controlled postpartum hypertension  LOS: 2 days  D./c home  Lacharles Altschuler V 08/06/2016, 3:13 PM

## 2016-08-06 NOTE — Lactation Note (Signed)
Lactation Consultation Note  Patient Name: Anne Murillo Today's Date: 08/06/2016   Spoke with mom with assistance of [place] # [id number]. Mom readmitted for BP problems. Mom reports she has not needs or questions in regards to BF at this time. Enc her to call with any questions/concerns as needed. Mom voiced understanding. Follow up prn.      Maternal Data    Feeding    LATCH Score/Interventions                      Lactation Tools Discussed/Used     Consult Status      Donn Pierini 08/06/2016, 11:25 AM

## 2016-08-31 ENCOUNTER — Encounter: Payer: Self-pay | Admitting: Medical

## 2016-08-31 ENCOUNTER — Ambulatory Visit (INDEPENDENT_AMBULATORY_CARE_PROVIDER_SITE_OTHER): Payer: Medicaid Other | Admitting: Medical

## 2016-08-31 DIAGNOSIS — I10 Essential (primary) hypertension: Secondary | ICD-10-CM | POA: Insufficient documentation

## 2016-08-31 MED ORDER — IBUPROFEN 600 MG PO TABS: 600.0000 mg | ORAL_TABLET | Freq: Four times a day (QID) | ORAL | 0 refills | Status: DC | PRN

## 2016-08-31 NOTE — Progress Notes (Signed)
Subjective:     Anne Murillo is a 30 y.o. female who presents for a postpartum visit. She is six weeks postpartum following a 07/20/2016. I have fully reviewed the prenatal and intrapartum course. The delivery was at 62 gestational weeks. Outcome: RLTCS. Anesthesia: Epidual. Postpartum course has been PP pre-eclampsia requiring re-admission. Baby's course has been uncomplicated. Baby is feeding by breast/bottle. Bleeding none at this time. Bowel function patient has some constipation . Bladder function is normal. Patient is sexually active. Contraception method is none at this time. Postpartum depression screening: negative  Patient complains of back pain. Has taken oxycodone and ibuprofen, but ran out of Rx. Has not taken anything since Thursday. Patient does not feel that medication was helping.   The following portions of the patient's history were reviewed and updated as appropriate: allergies, current medications, past family history, past medical history, past social history, past surgical history and problem list.  Review of Systems Pertinent items are noted in HPI.   Objective:    BP 108/79   Pulse 92   Wt 169 lb 8 oz (76.9 kg)   BMI 31.00 kg/m   General:  alert and cooperative   Breasts:  not performed  Lungs: clear to auscultation bilaterally  Heart:  regular rate and rhythm, S1, S2 normal, no murmur, click, rub or gallop  Abdomen: soft, non-tender; bowel sounds normal; no masses,  no organomegaly small area of granulation tissue noted along surgical incision. Silver nitrate applied. Patient tolerated well. Otherwise incision is well-healed.    Vulva:  not evaluated  Vagina: not evaluated  Cervix:  not evaluated  Corpus: not examined  Adnexa:  not evaluated  Rectal Exam: Not performed.        Assessment:     Normal postpartum exam. Pap smear not done at today's visit.  Patient had negative pap smear 06/2015.  Insomnia Back pain  Plan:    1. Contraception: none,  information about options provided. Patient undecided. States that she does not want to gain weight and so is reluctant to start birth control of any kind, even though she was counseled today that not all birth control methods have this side effect. Condom use advised until birth control initiated.  2. Rx for ibuprofen sent to patient's pharmacy for back pain. Advised heat to the area and proper posture for relief of back pain.  3. Patient to see Vesta Mixer for counseling due to insomnia  4. Follow up in: 1 year for annual exam or sooner as needed.    Luvenia Redden, PA-C 08/31/2016 9:24 AM

## 2016-08-31 NOTE — Patient Instructions (Signed)
Contraception Choices Birth control (contraception) is the use of any methods or devices to stop pregnancy from happening. Below are some methods to help avoid pregnancy. Hormonal birth control  A small tube put under the skin of the upper arm (implant). The tube can stay in place for 3 years. The implant must be taken out after 3 years.  Shots given every 3 months.  Pills taken every day.  Patches that are changed once a week.  A ring put into the vagina (vaginal ring). The ring is left in place for 3 weeks and removed for 1 week. Then, a new ring is put in the vagina.  Emergency birth control pills taken after unprotected sex (intercourse). Barrier birth control  A thin covering worn on the penis (female condom) during sex.  A soft, loose covering put into the vagina (female condom) before sex.  A rubber bowl that sits over the cervix (diaphragm). The bowl must be made for you. The bowl is put into the vagina before sex. The bowl is left in place for 6 to 8 hours after sex.  A small, soft cup that fits over the cervix (cervical cap). The cup must be made for you. The cup can be left in place for 48 hours after sex.  A sponge that is put into the vagina before sex.  A chemical that kills or stops sperm from getting into the cervix and uterus (spermicide). The chemical may be a cream, jelly, foam, or pill. Intrauterine (IUD) birth control  IUD birth control is a small, T-shaped piece of plastic. The plastic is put inside the uterus. There are 2 types of IUD:  Copper IUD. The IUD is covered in copper wire. The copper makes a fluid that kills sperm. It can stay in place for 10 years.  Hormone IUD. The hormone stops pregnancy from happening. It can stay in place for 5 years. Permanent methods  When the woman has her fallopian tubes sealed, tied, or blocked during surgery. This stops the egg from traveling to the uterus.  The doctor places a small coil or insert into each fallopian  tube. This causes scar tissue to form and blocks the fallopian tubes.  When the female has the tubes that carry sperm tied off (vasectomy). Natural family planning birth control  Natural family planning means not having sex or using barrier birth control on the days the woman could become pregnant.  Use a calendar to keep track of the length of each period and know the days she can get pregnant.  Avoid sex during ovulation.  Use a thermometer to measure body temperature. Also watch for symptoms of ovulation.  Time sex to be after the woman has ovulated. Use condoms to help protect yourself against sexually transmitted infections (STIs). Do this no matter what type of birth control you use. Talk to your doctor about which type of birth control is best for you. This information is not intended to replace advice given to you by your health care provider. Make sure you discuss any questions you have with your health care provider. Document Released: 06/28/2009 Document Revised: 02/06/2016 Document Reviewed: 03/22/2013 Elsevier Interactive Patient Education  2017 [employer].

## 2019-02-23 ENCOUNTER — Other Ambulatory Visit (HOSPITAL_COMMUNITY): Payer: Self-pay | Admitting: Nurse Practitioner

## 2019-02-23 DIAGNOSIS — O09521 Supervision of elderly multigravida, first trimester: Secondary | ICD-10-CM

## 2019-02-23 DIAGNOSIS — Z3682 Encounter for antenatal screening for nuchal translucency: Secondary | ICD-10-CM

## 2019-02-23 DIAGNOSIS — Z3A12 12 weeks gestation of pregnancy: Secondary | ICD-10-CM

## 2019-02-23 LAB — OB RESULTS CONSOLE RPR: RPR: NONREACTIVE

## 2019-02-23 LAB — OB RESULTS CONSOLE ANTIBODY SCREEN: Antibody Screen: NEGATIVE

## 2019-02-23 LAB — OB RESULTS CONSOLE HGB/HCT, BLOOD
HCT: 41 (ref 29–41)
Hemoglobin: 13.1

## 2019-02-23 LAB — GLUCOSE, 1 HOUR: Glucose 1 Hour: 115

## 2019-02-23 LAB — PAIN MGMT, PROFILE 6 CONF W/O MM, U: Drug Screen, Urine: NEGATIVE

## 2019-02-23 LAB — CULTURE, OB URINE: Urine Culture, OB: POSITIVE

## 2019-02-23 LAB — OB RESULTS CONSOLE GC/CHLAMYDIA
Chlamydia: NEGATIVE
Gonorrhea: NEGATIVE

## 2019-02-23 LAB — OB RESULTS CONSOLE HIV ANTIBODY (ROUTINE TESTING): HIV: NONREACTIVE

## 2019-02-23 LAB — OB RESULTS CONSOLE PLATELET COUNT: Platelets: 194

## 2019-02-23 LAB — OB RESULTS CONSOLE ABO/RH: RH Type: POSITIVE

## 2019-02-23 LAB — OB RESULTS CONSOLE HEPATITIS B SURFACE ANTIGEN: Hepatitis B Surface Ag: NEGATIVE

## 2019-02-23 LAB — OB RESULTS CONSOLE RUBELLA ANTIBODY, IGM: Rubella: IMMUNE

## 2019-03-07 ENCOUNTER — Encounter (HOSPITAL_COMMUNITY): Payer: Self-pay | Admitting: *Deleted

## 2019-03-08 ENCOUNTER — Encounter: Payer: Self-pay | Admitting: *Deleted

## 2019-03-10 ENCOUNTER — Encounter (HOSPITAL_COMMUNITY): Payer: Self-pay | Admitting: *Deleted

## 2019-03-10 ENCOUNTER — Encounter (HOSPITAL_COMMUNITY): Payer: Self-pay

## 2019-03-10 ENCOUNTER — Ambulatory Visit (HOSPITAL_COMMUNITY)
Admission: RE | Admit: 2019-03-10 | Discharge: 2019-03-10 | Disposition: A | Discharge status: Home / Self Care | Payer: Medicaid Other | Source: Ambulatory Visit | Attending: Obstetrics and Gynecology | Admitting: Obstetrics and Gynecology

## 2019-03-10 ENCOUNTER — Ambulatory Visit (HOSPITAL_COMMUNITY): Payer: Medicaid Other

## 2019-03-10 ENCOUNTER — Other Ambulatory Visit (HOSPITAL_COMMUNITY): Payer: Self-pay | Admitting: *Deleted

## 2019-03-10 ENCOUNTER — Ambulatory Visit (HOSPITAL_COMMUNITY): Payer: Medicaid Other | Admitting: *Deleted

## 2019-03-10 ENCOUNTER — Other Ambulatory Visit: Payer: Self-pay

## 2019-03-10 VITALS — BP 143/80 | HR 110 | Temp 98.5°F | Wt 204.0 lb

## 2019-03-10 DIAGNOSIS — O09521 Supervision of elderly multigravida, first trimester: Secondary | ICD-10-CM | POA: Diagnosis not present

## 2019-03-10 DIAGNOSIS — O09529 Supervision of elderly multigravida, unspecified trimester: Secondary | ICD-10-CM

## 2019-03-10 DIAGNOSIS — Z3682 Encounter for antenatal screening for nuchal translucency: Secondary | ICD-10-CM | POA: Diagnosis present

## 2019-03-10 DIAGNOSIS — Z3A12 12 weeks gestation of pregnancy: Secondary | ICD-10-CM

## 2019-03-10 NOTE — Progress Notes (Signed)
Interpreter present with patient.

## 2019-03-10 NOTE — Progress Notes (Signed)
Patient has declined Genetic Counseling and all genetic screening.

## 2019-03-15 ENCOUNTER — Telehealth: Payer: Self-pay | Admitting: Obstetrics & Gynecology

## 2019-03-15 NOTE — Telephone Encounter (Signed)
Called the patient to inform of the mychart visit. Informed the patient of the [employer] webex app. The patient stated she is unable to download the app without the help of her husband. Informed the patient of the visit being a televisit. If her husband can help her download the [employer] webex app tomorrow we'll change it to virtual.   Interrupter ID [phone]

## 2019-03-16 ENCOUNTER — Encounter: Payer: Self-pay | Admitting: Obstetrics and Gynecology

## 2019-03-16 ENCOUNTER — Ambulatory Visit (INDEPENDENT_AMBULATORY_CARE_PROVIDER_SITE_OTHER): Payer: Medicaid Other | Admitting: Obstetrics and Gynecology

## 2019-03-16 ENCOUNTER — Other Ambulatory Visit: Payer: Self-pay

## 2019-03-16 DIAGNOSIS — O0991 Supervision of high risk pregnancy, unspecified, first trimester: Secondary | ICD-10-CM | POA: Diagnosis not present

## 2019-03-16 DIAGNOSIS — Z789 Other specified health status: Secondary | ICD-10-CM | POA: Diagnosis not present

## 2019-03-16 DIAGNOSIS — Z3A12 12 weeks gestation of pregnancy: Secondary | ICD-10-CM

## 2019-03-16 DIAGNOSIS — O0990 Supervision of high risk pregnancy, unspecified, unspecified trimester: Secondary | ICD-10-CM

## 2019-03-16 DIAGNOSIS — Z98891 History of uterine scar from previous surgery: Secondary | ICD-10-CM | POA: Diagnosis not present

## 2019-03-16 DIAGNOSIS — O10919 Unspecified pre-existing hypertension complicating pregnancy, unspecified trimester: Secondary | ICD-10-CM | POA: Insufficient documentation

## 2019-03-16 DIAGNOSIS — O09521 Supervision of elderly multigravida, first trimester: Secondary | ICD-10-CM

## 2019-03-16 DIAGNOSIS — O10911 Unspecified pre-existing hypertension complicating pregnancy, first trimester: Secondary | ICD-10-CM

## 2019-03-16 DIAGNOSIS — O09529 Supervision of elderly multigravida, unspecified trimester: Secondary | ICD-10-CM | POA: Insufficient documentation

## 2019-03-16 MED ORDER — AMBULATORY NON FORMULARY MEDICATION: 1.0000 | 0 refills | Status: DC

## 2019-03-16 MED ORDER — ASPIRIN EC 81 MG PO TBEC: 81.0000 mg | DELAYED_RELEASE_TABLET | Freq: Every day | ORAL | 2 refills | Status: DC

## 2019-03-16 NOTE — Progress Notes (Signed)
   TELEHEALTH VIRTUAL OBSTETRICS VISIT ENCOUNTER NOTE  I connected with Anne Murillo on 03/16/19 at  9:55 AM EDT by telephone at home and verified that I am speaking with the correct person using two identifiers.   I discussed the limitations, risks, security and privacy concerns of performing an evaluation and management service by telephone and the availability of in person appointments. I also discussed with the patient that there may be a patient responsible charge related to this service. The patient expressed understanding and agreed to proceed.  Subjective:  Anne Murillo is a 33 y.o. T5T7322 at [redacted]w[redacted]d being followed for first OB appt. EDD by LMP and confirmed by first trimester. H/O CHTN with no meds. H/O PP PEC with last pregnancy. H/O c section x 2.  She is currently monitored for the following issues for this high-risk pregnancy and has Supervision of high risk pregnancy, antepartum; Chronic hypertension affecting pregnancy; History of 2 cesarean sections; Language barrier; and AMA (advanced maternal age) multigravida 35+ on their problem list.  Patient reports no complaints. Reports fetal movement. Denies any contractions, bleeding or leaking of fluid.   The following portions of the patient's history were reviewed and updated as appropriate: allergies, current medications, past family history, past medical history, past social history, past surgical history and problem list.   Objective:   General:  Alert, oriented and cooperative.   Mental Status: Normal mood and affect perceived. Normal judgment and thought content.  Rest of physical exam deferred due to type of encounter  Assessment and Plan:  Pregnancy: G2R4270 at [redacted]w[redacted]d 1. Supervision of high risk pregnancy, antepartum Prenatal labs and care reviewed with pt - AMBULATORY NON FORMULARY MEDICATION; 1 Device by Other route once a week. Medication Name: Blood Pressure cuff-Medium Monitored regularly at home ICD 10: z34.90   Dispense: 1 Device; Refill: 0  2. Chronic hypertension affecting pregnancy CHTN and pregnancy reviewed with pt BP cuff ordered and BP monitoring reviewed with pt. Antenatal testing reviewed with pt - aspirin EC 81 MG tablet; Take 1 tablet (81 mg total) by mouth daily. Take after 12 weeks for prevention of preeclampsia later in pregnancy  Dispense: 300 tablet; Refill: 2  3. History of 2 cesarean sections Will need repeat at 39 weeks  4. Language barrier Interrupter services used during today's visit  5. Multigravida of advanced maternal age in first trimester Declined genetics  Preterm labor symptoms and general obstetric precautions including but not limited to vaginal bleeding, contractions, leaking of fluid and fetal movement were reviewed in detail with the patient.  I discussed the assessment and treatment plan with the patient. The patient was provided an opportunity to ask questions and all were answered. The patient agreed with the plan and demonstrated an understanding of the instructions. The patient was advised to call back or seek an in-person office evaluation/go to [facility] for any urgent or concerning symptoms. Please refer to After Visit Summary for other counseling recommendations.   I provided 10 minutes of non-face-to-face time during this encounter.  Return in about 2 weeks (around 03/30/2019) for OB visit, face to face for PNV and BP check.  Future Appointments  Date Time Provider [place]  04/28/2019  9:30 AM [place] NURSE [place] MFC-US  04/28/2019  9:30 AM [place]-MFC [place] 1 WH-MFCUS MFC-US    Chancy Milroy, [place] for [facility], [place]

## 2019-03-16 NOTE — Progress Notes (Signed)
Medicaid BP Cuff order placed-03/16/2019// sent to [facility].

## 2019-03-22 ENCOUNTER — Encounter: Payer: Self-pay | Admitting: *Deleted

## 2019-03-22 DIAGNOSIS — D259 Leiomyoma of uterus, unspecified: Secondary | ICD-10-CM | POA: Insufficient documentation

## 2019-04-03 ENCOUNTER — Ambulatory Visit (INDEPENDENT_AMBULATORY_CARE_PROVIDER_SITE_OTHER): Payer: Medicaid Other | Admitting: Obstetrics and Gynecology

## 2019-04-03 ENCOUNTER — Encounter: Payer: Self-pay | Admitting: Obstetrics and Gynecology

## 2019-04-03 ENCOUNTER — Other Ambulatory Visit: Payer: Self-pay

## 2019-04-03 VITALS — BP 126/76 | HR 118 | Temp 100.0°F | Wt 206.7 lb

## 2019-04-03 DIAGNOSIS — O2342 Unspecified infection of urinary tract in pregnancy, second trimester: Secondary | ICD-10-CM

## 2019-04-03 DIAGNOSIS — Z8679 Personal history of other diseases of the circulatory system: Secondary | ICD-10-CM | POA: Insufficient documentation

## 2019-04-03 DIAGNOSIS — O0992 Supervision of high risk pregnancy, unspecified, second trimester: Secondary | ICD-10-CM

## 2019-04-03 DIAGNOSIS — O10912 Unspecified pre-existing hypertension complicating pregnancy, second trimester: Secondary | ICD-10-CM

## 2019-04-03 DIAGNOSIS — O09522 Supervision of elderly multigravida, second trimester: Secondary | ICD-10-CM

## 2019-04-03 DIAGNOSIS — O10919 Unspecified pre-existing hypertension complicating pregnancy, unspecified trimester: Secondary | ICD-10-CM

## 2019-04-03 DIAGNOSIS — Z98891 History of uterine scar from previous surgery: Secondary | ICD-10-CM

## 2019-04-03 DIAGNOSIS — O2340 Unspecified infection of urinary tract in pregnancy, unspecified trimester: Secondary | ICD-10-CM

## 2019-04-03 DIAGNOSIS — Z3A15 15 weeks gestation of pregnancy: Secondary | ICD-10-CM

## 2019-04-03 DIAGNOSIS — O0990 Supervision of high risk pregnancy, unspecified, unspecified trimester: Secondary | ICD-10-CM

## 2019-04-03 DIAGNOSIS — Z789 Other specified health status: Secondary | ICD-10-CM

## 2019-04-03 MED ORDER — PREPLUS 27-1 MG PO TABS: 1.0000 | ORAL_TABLET | Freq: Every day | ORAL | 4 refills | Status: DC

## 2019-04-03 MED ORDER — NYSTATIN 100000 UNIT/GM EX POWD: Freq: Two times a day (BID) | CUTANEOUS | 0 refills | Status: AC

## 2019-04-03 NOTE — Addendum Note (Signed)
Addended by: Aletha Halim on: 04/03/2019 03:11 PM   Modules accepted: Orders

## 2019-04-03 NOTE — Progress Notes (Signed)
Prenatal Visit Note Date: 04/03/2019 Clinic: [facility]  Subjective:  Anne Murillo is a 33 y.o. [id number] at [redacted]w[redacted]d being seen today for ongoing prenatal care.  She is currently monitored for the following issues for this high-risk pregnancy and has UTI in pregnancy; Supervision of high risk pregnancy, antepartum; Chronic hypertension affecting pregnancy; History of 2 cesarean sections; Language barrier; AMA (advanced maternal age) multigravida 28+; Leiomyoma of uterus; and History of postpartum pre-eclampsia on their problem list.  Patient reports left axilla rash.   Contractions: Not present. Vag. Bleeding: None.  Movement: Absent. Denies leaking of fluid.   The following portions of the patient's history were reviewed and updated as appropriate: allergies, current medications, past family history, past medical history, past social history, past surgical history and problem list. Problem list updated.  Objective:   Vitals:   04/03/19 1401  BP: 126/76  Pulse: (!) 118  Temp: 100 F (37.8 C)  Weight: 206 lb 11.2 oz (93.8 kg)    Fetal Status: Fetal Heart Rate (bpm): 154   Movement: Absent     General:  Alert, oriented and cooperative. Patient is in no acute distress.  Skin: Skin is warm and dry. No rash noted.   Cardiovascular: Normal heart rate noted  Respiratory: Normal respiratory effort, no problems with respiration noted  Abdomen: Soft, gravid, appropriate for gestational age. Pain/Pressure: Absent     Pelvic:  Cervical exam deferred        Extremities: Normal range of motion.  Edema: None  Mental Status: Normal mood and affect. Normal behavior. Normal judgment and thought content.  Left axila rash 6cm, c/w fungal Urinalysis:      Assessment and Plan:  Pregnancy: Y6T0354 at [redacted]w[redacted]d  1. Urinary tract infection in mother during pregnancy, antepartum toc nv  2. Supervision of high risk pregnancy, antepartum Anatomy already scheduled. Nystatin powder  sent in. Pt doesn't use deodorant and rarely shaves. Pt told to stop the latter for now.   3. History of 2 cesarean sections Needs rpt scheduled later in pregnancy. Ask about btl later  4. Language barrier Interpreter used  5. Multigravida of advanced maternal age in second trimester Declined genetics  6. Chronic hypertension affecting pregnancy Confirms low dose asa.   Preterm labor symptoms and general obstetric precautions including but not limited to vaginal bleeding, contractions, leaking of fluid and fetal movement were reviewed in detail with the patient. Please refer to After Visit Summary for other counseling recommendations.  Return in about 25 days (around 04/28/2019).   Aletha Halim, MD

## 2019-04-17 ENCOUNTER — Encounter: Payer: Self-pay | Admitting: *Deleted

## 2019-04-26 ENCOUNTER — Telehealth: Payer: Self-pay | Admitting: Emergency Medicine

## 2019-04-26 NOTE — Telephone Encounter (Signed)
Pt husband called and left a message on the nurse voicemail line stating that he was calling because the patient received a message stating she had an appointment on 8/14 but she thought the appointment was 8/13.   After reviewing the pt chart, pt call was returned using [place] interpreters ID# [phone]. Pt was informed that she has a telephone appointment with the clinic on 8/13 at 1:15 and an ultrasound appt on 8/14 at 9:30. Pt verbalized understanding and had no further questions.

## 2019-04-27 ENCOUNTER — Encounter: Payer: Self-pay | Admitting: Obstetrics and Gynecology

## 2019-04-27 ENCOUNTER — Other Ambulatory Visit: Payer: Self-pay

## 2019-04-27 ENCOUNTER — Ambulatory Visit (INDEPENDENT_AMBULATORY_CARE_PROVIDER_SITE_OTHER): Payer: Medicaid Other | Admitting: Obstetrics and Gynecology

## 2019-04-27 VITALS — BP 127/84 | HR 102

## 2019-04-27 DIAGNOSIS — O0992 Supervision of high risk pregnancy, unspecified, second trimester: Secondary | ICD-10-CM | POA: Diagnosis not present

## 2019-04-27 DIAGNOSIS — Z789 Other specified health status: Secondary | ICD-10-CM

## 2019-04-27 DIAGNOSIS — Z98891 History of uterine scar from previous surgery: Secondary | ICD-10-CM | POA: Diagnosis not present

## 2019-04-27 DIAGNOSIS — O10912 Unspecified pre-existing hypertension complicating pregnancy, second trimester: Secondary | ICD-10-CM

## 2019-04-27 DIAGNOSIS — Z3A18 18 weeks gestation of pregnancy: Secondary | ICD-10-CM

## 2019-04-27 DIAGNOSIS — O10919 Unspecified pre-existing hypertension complicating pregnancy, unspecified trimester: Secondary | ICD-10-CM

## 2019-04-27 DIAGNOSIS — O0990 Supervision of high risk pregnancy, unspecified, unspecified trimester: Secondary | ICD-10-CM

## 2019-04-27 DIAGNOSIS — O09522 Supervision of elderly multigravida, second trimester: Secondary | ICD-10-CM

## 2019-04-27 NOTE — Progress Notes (Signed)
I connected with  Anne Murillo on 04/27/19 at  1:15 PM EDT by telephone and verified that I am speaking with the correct person using two identifiers.   I discussed the limitations, risks, security and privacy concerns of performing an evaluation and management service by telephone and the availability of in person appointments. I also discussed with the patient that there may be a patient responsible charge related to this service. The patient expressed understanding and agreed to proceed.  With [employer] # [phone]  Verdell Carmine, RN 04/27/2019  1:31 PM

## 2019-04-27 NOTE — Progress Notes (Signed)
   TELEHEALTH VIRTUAL OBSTETRICS VISIT ENCOUNTER NOTE  I connected with Margarie Klausner on 04/27/19 at  1:15 PM EDT by telephone at home and verified that I am speaking with the correct person using two identifiers.   I discussed the limitations, risks, security and privacy concerns of performing an evaluation and management service by telephone and the availability of in person appointments. I also discussed with the patient that there may be a patient responsible charge related to this service. The patient expressed understanding and agreed to proceed.  Subjective:  Anne Murillo is a 33 y.o. K1S0109 at [redacted]w[redacted]d being followed for ongoing prenatal care.  She is currently monitored for the following issues for this high-risk pregnancy and has UTI in pregnancy; Supervision of high risk pregnancy, antepartum; Chronic hypertension affecting pregnancy; History of 2 cesarean sections; Language barrier; AMA (advanced maternal age) multigravida 39+; Leiomyoma of uterus; and History of postpartum pre-eclampsia on their problem list.  Patient reports no complaints. Reports fetal movement. Denies any contractions, bleeding or leaking of fluid.   The following portions of the patient's history were reviewed and updated as appropriate: allergies, current medications, past family history, past medical history, past social history, past surgical history and problem list.   Objective:   General:  Alert, oriented and cooperative.   Mental Status: Normal mood and affect perceived. Normal judgment and thought content.  Rest of physical exam deferred due to type of encounter  Assessment and Plan:  Pregnancy: N2T5573 at [redacted]w[redacted]d 1. Supervision of high risk pregnancy, antepartum Patient is doing well without complaints Anatomy ultrasound tomorrow Urine culture tomorrow  - Culture, OB Urine; Future  2. Chronic hypertension affecting pregnancy Continue ASA  3. Multigravida of advanced maternal age in second  trimester Patient declined genetic screening  4. History of 2 cesarean sections Will be scheduled for a repeat    5. Language barrier [place] interpreter used during the encounter  Preterm labor symptoms and general obstetric precautions including but not limited to vaginal bleeding, contractions, leaking of fluid and fetal movement were reviewed in detail with the patient.  I discussed the assessment and treatment plan with the patient. The patient was provided an opportunity to ask questions and all were answered. The patient agreed with the plan and demonstrated an understanding of the instructions. The patient was advised to call back or seek an in-person office evaluation/go to MAU at [facility] for any urgent or concerning symptoms. Please refer to After Visit Summary for other counseling recommendations.   I provided 15 minutes of non-face-to-face time during this encounter.  Return in about 4 weeks (around 05/25/2019) for [facility], [place], High risk.  Future Appointments  Date Time Provider [place]  04/28/2019  9:30 AM [place] MFC-US  04/28/2019  9:30 AM [place] 1 WH-MFCUS MFC-US  05/11/2019  9:35 AM Lavonia Drafts, MD WOC-WOCA WOC    Mora Bellman, MD Center for [facility], [place]

## 2019-04-28 ENCOUNTER — Encounter (HOSPITAL_COMMUNITY): Payer: Self-pay

## 2019-04-28 ENCOUNTER — Other Ambulatory Visit (HOSPITAL_COMMUNITY): Payer: Self-pay | Admitting: *Deleted

## 2019-04-28 ENCOUNTER — Ambulatory Visit (HOSPITAL_COMMUNITY)
Admission: RE | Admit: 2019-04-28 | Discharge: 2019-04-28 | Disposition: A | Discharge status: Home / Self Care | Payer: Medicaid Other | Source: Ambulatory Visit | Attending: Obstetrics and Gynecology | Admitting: Obstetrics and Gynecology

## 2019-04-28 ENCOUNTER — Other Ambulatory Visit: Payer: Self-pay

## 2019-04-28 ENCOUNTER — Ambulatory Visit (HOSPITAL_COMMUNITY): Payer: Medicaid Other | Admitting: *Deleted

## 2019-04-28 VITALS — BP 116/75 | HR 107 | Temp 99.0°F

## 2019-04-28 DIAGNOSIS — O10912 Unspecified pre-existing hypertension complicating pregnancy, second trimester: Secondary | ICD-10-CM

## 2019-04-28 DIAGNOSIS — O10919 Unspecified pre-existing hypertension complicating pregnancy, unspecified trimester: Secondary | ICD-10-CM | POA: Diagnosis present

## 2019-04-28 DIAGNOSIS — O10012 Pre-existing essential hypertension complicating pregnancy, second trimester: Secondary | ICD-10-CM

## 2019-04-28 DIAGNOSIS — O09292 Supervision of pregnancy with other poor reproductive or obstetric history, second trimester: Secondary | ICD-10-CM

## 2019-04-28 DIAGNOSIS — O0990 Supervision of high risk pregnancy, unspecified, unspecified trimester: Secondary | ICD-10-CM

## 2019-04-28 DIAGNOSIS — O3412 Maternal care for benign tumor of corpus uteri, second trimester: Secondary | ICD-10-CM

## 2019-04-28 DIAGNOSIS — O34219 Maternal care for unspecified type scar from previous cesarean delivery: Secondary | ICD-10-CM | POA: Diagnosis not present

## 2019-04-28 DIAGNOSIS — O09522 Supervision of elderly multigravida, second trimester: Secondary | ICD-10-CM

## 2019-04-28 DIAGNOSIS — O09529 Supervision of elderly multigravida, unspecified trimester: Secondary | ICD-10-CM

## 2019-04-28 DIAGNOSIS — O99212 Obesity complicating pregnancy, second trimester: Secondary | ICD-10-CM

## 2019-04-28 DIAGNOSIS — D259 Leiomyoma of uterus, unspecified: Secondary | ICD-10-CM

## 2019-04-28 DIAGNOSIS — Z3A19 19 weeks gestation of pregnancy: Secondary | ICD-10-CM

## 2019-04-28 DIAGNOSIS — Z315 Encounter for genetic counseling: Secondary | ICD-10-CM

## 2019-05-10 ENCOUNTER — Telehealth: Payer: Self-pay | Admitting: Obstetrics & Gynecology

## 2019-05-10 NOTE — Telephone Encounter (Signed)
Attempted to contact patient w/ [place] interpreter ID# [id number] about her appointment on 8/27 @ 9:35. No answer interpreter left voicemail instructing that the visit is a phone appointment. Patient instructed not to come to the office for this appointment.

## 2019-05-11 ENCOUNTER — Encounter: Payer: Self-pay | Admitting: Obstetrics & Gynecology

## 2019-05-11 ENCOUNTER — Encounter: Payer: Medicaid Other | Admitting: Obstetrics & Gynecology

## 2019-05-11 ENCOUNTER — Other Ambulatory Visit: Payer: Self-pay

## 2019-05-11 ENCOUNTER — Ambulatory Visit (INDEPENDENT_AMBULATORY_CARE_PROVIDER_SITE_OTHER): Payer: Medicaid Other | Admitting: Obstetrics & Gynecology

## 2019-05-11 VITALS — BP 139/85 | HR 85

## 2019-05-11 DIAGNOSIS — Z8679 Personal history of other diseases of the circulatory system: Secondary | ICD-10-CM

## 2019-05-11 DIAGNOSIS — Z8759 Personal history of other complications of pregnancy, childbirth and the puerperium: Secondary | ICD-10-CM

## 2019-05-11 DIAGNOSIS — O0992 Supervision of high risk pregnancy, unspecified, second trimester: Secondary | ICD-10-CM

## 2019-05-11 DIAGNOSIS — Z789 Other specified health status: Secondary | ICD-10-CM | POA: Diagnosis not present

## 2019-05-11 DIAGNOSIS — O0990 Supervision of high risk pregnancy, unspecified, unspecified trimester: Secondary | ICD-10-CM

## 2019-05-11 DIAGNOSIS — O10919 Unspecified pre-existing hypertension complicating pregnancy, unspecified trimester: Secondary | ICD-10-CM

## 2019-05-11 DIAGNOSIS — Z98891 History of uterine scar from previous surgery: Secondary | ICD-10-CM

## 2019-05-11 DIAGNOSIS — O09522 Supervision of elderly multigravida, second trimester: Secondary | ICD-10-CM

## 2019-05-11 DIAGNOSIS — O10912 Unspecified pre-existing hypertension complicating pregnancy, second trimester: Secondary | ICD-10-CM | POA: Diagnosis not present

## 2019-05-11 DIAGNOSIS — O09529 Supervision of elderly multigravida, unspecified trimester: Secondary | ICD-10-CM

## 2019-05-11 DIAGNOSIS — Z3A20 20 weeks gestation of pregnancy: Secondary | ICD-10-CM

## 2019-05-11 NOTE — Progress Notes (Signed)
   TELEHEALTH VIRTUAL OBSTETRICS VISIT ENCOUNTER NOTE  I connected with Anne Murillo on 05/11/19 at  9:35 AM EDT by telephone at home and verified that I am speaking with the correct person using two identifiers.   I discussed the limitations, risks, security and privacy concerns of performing an evaluation and management service by telephone and the availability of in person appointments. I also discussed with the patient that there may be a patient responsible charge related to this service. The patient expressed understanding and agreed to proceed.  Subjective:  Anne Murillo is a 33 y.o. [id number] at [redacted]w[redacted]d being followed for ongoing prenatal care.  She is currently monitored for the following issues for this high-risk pregnancy and has UTI in pregnancy; Supervision of high risk pregnancy, antepartum; Chronic hypertension affecting pregnancy; History of 2 cesarean sections; Language barrier; AMA (advanced maternal age) multigravida 63+; Leiomyoma of uterus; and History of postpartum pre-eclampsia on their problem list.  Patient reports no complaints. Reports fetal movement. Denies any contractions, bleeding or leaking of fluid.   The following portions of the patient's history were reviewed and updated as appropriate: allergies, current medications, past family history, past medical history, past social history, past surgical history and problem list.   Objective:   General:  Alert, oriented and cooperative.   Mental Status: Normal mood and affect perceived. Normal judgment and thought content.  Rest of physical exam deferred due to type of encounter  Assessment and Plan:  Pregnancy: [id number] at [redacted]w[redacted]d 1. Supervision of high risk pregnancy, antepartum No problems Needs PAP next visit.   2. Language barrier [place] interpreter used for entire visit.   3. History of postpartum pre-eclampsia On baby ASA  4. History of 2 cesarean sections For repeat c/s at 39 weeks   5. Chronic  hypertension affecting pregnancy WNL. Will check weekly.   Taking baby ASA daily  Has f/u US for growth to complete anatomy    6. Antepartum multigravida of advanced maternal age Declined genetic testing    Preterm labor symptoms and general obstetric precautions including but not limited to vaginal bleeding, contractions, leaking of fluid and fetal movement were reviewed in detail with the patient.  I discussed the assessment and treatment plan with the patient. The patient was provided an opportunity to ask questions and all were answered. The patient agreed with the plan and demonstrated an understanding of the instructions. The patient was advised to call back or seek an in-person office evaluation/go to MAU at [facility] for any urgent or concerning symptoms. Please refer to After Visit Summary for other counseling recommendations.   I provided 14 minutes of non-face-to-face time during this encounter.  No follow-ups on file.  Future Appointments  Date Time Provider [place]  05/26/2019  9:30 AM [place] MFC-US  05/26/2019  9:30 AM [place] 5 WH-MFCUS MFC-US    Lavonia Drafts, MD Center for [facility], [place]

## 2019-05-11 NOTE — Progress Notes (Signed)
[place] interpreter @941am   I connected with  Blaise Cisney on 05/11/19 at  9:35 AM EDT by telephone and verified that I am speaking with the correct person using two identifiers.   I discussed the limitations, risks, security and privacy concerns of performing an evaluation and management service by telephone and the availability of in person appointments. I also discussed with the patient that there may be a patient responsible charge related to this service. The patient expressed understanding and agreed to proceed.  [place], CMA 05/11/2019  9:42 AM   No Questions  Feels okay/ Less Nausea.

## 2019-05-26 ENCOUNTER — Ambulatory Visit (HOSPITAL_COMMUNITY): Payer: Medicaid Other

## 2019-06-02 ENCOUNTER — Other Ambulatory Visit: Payer: Self-pay

## 2019-06-02 ENCOUNTER — Other Ambulatory Visit (HOSPITAL_COMMUNITY)
Admission: RE | Admit: 2019-06-02 | Discharge: 2019-06-02 | Disposition: A | Discharge status: Home / Self Care | Payer: Medicaid Other | Source: Ambulatory Visit | Attending: Obstetrics and Gynecology | Admitting: Obstetrics and Gynecology

## 2019-06-02 ENCOUNTER — Encounter (HOSPITAL_COMMUNITY): Payer: Self-pay

## 2019-06-02 ENCOUNTER — Ambulatory Visit (INDEPENDENT_AMBULATORY_CARE_PROVIDER_SITE_OTHER): Payer: Medicaid Other | Admitting: Obstetrics and Gynecology

## 2019-06-02 ENCOUNTER — Other Ambulatory Visit (HOSPITAL_COMMUNITY): Payer: Self-pay | Admitting: *Deleted

## 2019-06-02 ENCOUNTER — Encounter: Payer: Self-pay | Admitting: Obstetrics and Gynecology

## 2019-06-02 ENCOUNTER — Ambulatory Visit (HOSPITAL_COMMUNITY)
Admission: RE | Admit: 2019-06-02 | Discharge: 2019-06-02 | Disposition: A | Discharge status: Home / Self Care | Payer: Medicaid Other | Source: Ambulatory Visit | Attending: Maternal & Fetal Medicine | Admitting: Maternal & Fetal Medicine

## 2019-06-02 ENCOUNTER — Ambulatory Visit (HOSPITAL_COMMUNITY): Payer: Medicaid Other | Admitting: *Deleted

## 2019-06-02 VITALS — BP 124/74 | HR 116 | Temp 99.1°F

## 2019-06-02 VITALS — BP 110/75 | HR 109 | Wt 207.8 lb

## 2019-06-02 DIAGNOSIS — O0990 Supervision of high risk pregnancy, unspecified, unspecified trimester: Secondary | ICD-10-CM | POA: Diagnosis not present

## 2019-06-02 DIAGNOSIS — O09292 Supervision of pregnancy with other poor reproductive or obstetric history, second trimester: Secondary | ICD-10-CM

## 2019-06-02 DIAGNOSIS — Z3A24 24 weeks gestation of pregnancy: Secondary | ICD-10-CM

## 2019-06-02 DIAGNOSIS — O09522 Supervision of elderly multigravida, second trimester: Secondary | ICD-10-CM

## 2019-06-02 DIAGNOSIS — O10012 Pre-existing essential hypertension complicating pregnancy, second trimester: Secondary | ICD-10-CM | POA: Diagnosis not present

## 2019-06-02 DIAGNOSIS — Z789 Other specified health status: Secondary | ICD-10-CM

## 2019-06-02 DIAGNOSIS — Z315 Encounter for genetic counseling: Secondary | ICD-10-CM

## 2019-06-02 DIAGNOSIS — O10913 Unspecified pre-existing hypertension complicating pregnancy, third trimester: Secondary | ICD-10-CM

## 2019-06-02 DIAGNOSIS — Z98891 History of uterine scar from previous surgery: Secondary | ICD-10-CM

## 2019-06-02 DIAGNOSIS — O99212 Obesity complicating pregnancy, second trimester: Secondary | ICD-10-CM

## 2019-06-02 DIAGNOSIS — Z362 Encounter for other antenatal screening follow-up: Secondary | ICD-10-CM | POA: Diagnosis not present

## 2019-06-02 DIAGNOSIS — O10919 Unspecified pre-existing hypertension complicating pregnancy, unspecified trimester: Secondary | ICD-10-CM | POA: Diagnosis present

## 2019-06-02 DIAGNOSIS — O43199 Other malformation of placenta, unspecified trimester: Secondary | ICD-10-CM | POA: Insufficient documentation

## 2019-06-02 DIAGNOSIS — O2342 Unspecified infection of urinary tract in pregnancy, second trimester: Secondary | ICD-10-CM

## 2019-06-02 DIAGNOSIS — O10912 Unspecified pre-existing hypertension complicating pregnancy, second trimester: Secondary | ICD-10-CM | POA: Diagnosis present

## 2019-06-02 DIAGNOSIS — D259 Leiomyoma of uterus, unspecified: Secondary | ICD-10-CM

## 2019-06-02 DIAGNOSIS — O3412 Maternal care for benign tumor of corpus uteri, second trimester: Secondary | ICD-10-CM

## 2019-06-02 DIAGNOSIS — O0992 Supervision of high risk pregnancy, unspecified, second trimester: Secondary | ICD-10-CM

## 2019-06-02 DIAGNOSIS — O34219 Maternal care for unspecified type scar from previous cesarean delivery: Secondary | ICD-10-CM | POA: Diagnosis not present

## 2019-06-02 NOTE — Progress Notes (Signed)
Interpreter Lucretia Field present for encounter

## 2019-06-02 NOTE — Progress Notes (Signed)
Prenatal Visit Note Date: 06/02/2019 Clinic: [facility]  Subjective:  Bethania Zuba is a 33 y.o. [id number] at [redacted]w[redacted]d being seen today for ongoing prenatal care.  She is currently monitored for the following issues for this high-risk pregnancy and has UTI in pregnancy; Supervision of high risk pregnancy, antepartum; Chronic hypertension affecting pregnancy; History of 2 cesarean sections; Language barrier; AMA (advanced maternal age) multigravida 67+; Leiomyoma of uterus; History of postpartum pre-eclampsia; and Marginal insertion of umbilical cord affecting management of mother on their problem list.  Patient reports didn't know how to take vag supp for yeast.   Contractions: Not present. Vag. Bleeding: None.  Movement: Present. Denies leaking of fluid.   The following portions of the patient's history were reviewed and updated as appropriate: allergies, current medications, past family history, past medical history, past social history, past surgical history and problem list. Problem list updated.  Objective:   Vitals:   06/02/19 0930  BP: 110/75  Pulse: (!) 109  Weight: 207 lb 12.8 oz (94.3 kg)    Fetal Status: Fetal Heart Rate (bpm): 158   Movement: Present     General:  Alert, oriented and cooperative. Patient is in no acute distress.  Skin: Skin is warm and dry. No rash noted.   Cardiovascular: Normal heart rate noted  Respiratory: Normal respiratory effort, no problems with respiration noted  Abdomen: Soft, gravid, appropriate for gestational age. Pain/Pressure: Absent     Pelvic:  Cervical exam performed Dilation: Closed Effacement (%): Thick    Extremities: Normal range of motion.  Edema: None  Mental Status: Normal mood and affect. Normal behavior. Normal judgment and thought content.   Urinalysis:      Assessment and Plan:  Pregnancy: [id number] at [redacted]w[redacted]d  1. Urinary tract infection in mother during second trimester of pregnancy toc today - Culture,  OB Urine  2. Supervision of high risk pregnancy, antepartum Pap done today +s/s of VVC. cx visually closed/long Continue low dose asa - Cytology - PAP( [place]) - Culture, OB Urine - Glucose Tolerance, 2 Hours w/1 Hour; Future - CBC; Future - HIV antibody (with reflex); Future - RPR; Future  3. Chronic hypertension affecting pregnancy Follow up growth today Start ap testing at 32wks  4. History of 2 cesarean sections D/w her re: btl and rpt nv  5. Language barrier Interpreter used  6. Multigravida of advanced maternal age in second trimester No issues  7. Uterine leiomyoma, unspecified location F/u u/s from today  Preterm labor symptoms and general obstetric precautions including but not limited to vaginal bleeding, contractions, leaking of fluid and fetal movement were reviewed in detail with the patient. Please refer to After Visit Summary for other counseling recommendations.  Return in about 1 month (around 07/02/2019) for in person, high risk, 2hr GTT.   Aletha Halim, MD

## 2019-06-02 NOTE — Progress Notes (Signed)
um 

## 2019-06-06 LAB — URINE CULTURE, OB REFLEX

## 2019-06-06 LAB — CULTURE, OB URINE

## 2019-06-07 LAB — CYTOLOGY - PAP
Diagnosis: NEGATIVE
High risk HPV: NEGATIVE
Molecular Disclaimer: 56
Molecular Disclaimer: DETECTED
Molecular Disclaimer: NORMAL

## 2019-06-08 ENCOUNTER — Encounter: Payer: Self-pay | Admitting: Obstetrics and Gynecology

## 2019-06-08 MED ORDER — NITROFURANTOIN MONOHYD MACRO 100 MG PO CAPS: ORAL_CAPSULE | ORAL | 0 refills | Status: DC

## 2019-06-08 NOTE — Addendum Note (Signed)
Addended by: Aletha Halim on: 06/08/2019 12:41 PM   Modules accepted: Orders

## 2019-06-09 ENCOUNTER — Telehealth: Payer: Self-pay | Admitting: *Deleted

## 2019-06-09 NOTE — Telephone Encounter (Addendum)
-----   Message from Aletha Halim, MD sent at 06/08/2019 12:41 PM EDT ----- Please let her know that she has a UTI and that abx was sent in for her. Thanks  9/25  1250  Called pt with [facility] interpreter Elta Guadel[place] [phone]. I advised of test results showing bladder infection. A prescription for antibiotic has been sent to her pharmacy. Dosage instructions reviewed and pt verbalized understanding.

## 2019-06-30 ENCOUNTER — Encounter: Payer: Self-pay | Admitting: Obstetrics and Gynecology

## 2019-06-30 ENCOUNTER — Ambulatory Visit (INDEPENDENT_AMBULATORY_CARE_PROVIDER_SITE_OTHER): Payer: Medicaid Other | Admitting: Obstetrics and Gynecology

## 2019-06-30 ENCOUNTER — Other Ambulatory Visit (HOSPITAL_COMMUNITY): Payer: Self-pay | Admitting: *Deleted

## 2019-06-30 ENCOUNTER — Other Ambulatory Visit: Payer: Self-pay

## 2019-06-30 ENCOUNTER — Encounter (HOSPITAL_COMMUNITY): Payer: Self-pay | Admitting: *Deleted

## 2019-06-30 ENCOUNTER — Other Ambulatory Visit: Payer: Medicaid Other

## 2019-06-30 ENCOUNTER — Ambulatory Visit (HOSPITAL_COMMUNITY): Payer: Medicaid Other | Admitting: *Deleted

## 2019-06-30 ENCOUNTER — Ambulatory Visit (HOSPITAL_COMMUNITY)
Admission: RE | Admit: 2019-06-30 | Discharge: 2019-06-30 | Disposition: A | Discharge status: Home / Self Care | Payer: Medicaid Other | Source: Ambulatory Visit | Attending: Obstetrics | Admitting: Obstetrics

## 2019-06-30 VITALS — BP 120/79 | HR 88 | Wt 208.8 lb

## 2019-06-30 DIAGNOSIS — O10913 Unspecified pre-existing hypertension complicating pregnancy, third trimester: Secondary | ICD-10-CM

## 2019-06-30 DIAGNOSIS — D259 Leiomyoma of uterus, unspecified: Secondary | ICD-10-CM

## 2019-06-30 DIAGNOSIS — Z23 Encounter for immunization: Secondary | ICD-10-CM

## 2019-06-30 DIAGNOSIS — Z3A28 28 weeks gestation of pregnancy: Secondary | ICD-10-CM

## 2019-06-30 DIAGNOSIS — O0990 Supervision of high risk pregnancy, unspecified, unspecified trimester: Secondary | ICD-10-CM

## 2019-06-30 DIAGNOSIS — O0993 Supervision of high risk pregnancy, unspecified, third trimester: Secondary | ICD-10-CM

## 2019-06-30 DIAGNOSIS — Z8679 Personal history of other diseases of the circulatory system: Secondary | ICD-10-CM | POA: Diagnosis not present

## 2019-06-30 DIAGNOSIS — O09523 Supervision of elderly multigravida, third trimester: Secondary | ICD-10-CM

## 2019-06-30 DIAGNOSIS — Z789 Other specified health status: Secondary | ICD-10-CM

## 2019-06-30 DIAGNOSIS — O10919 Unspecified pre-existing hypertension complicating pregnancy, unspecified trimester: Secondary | ICD-10-CM

## 2019-06-30 DIAGNOSIS — O10012 Pre-existing essential hypertension complicating pregnancy, second trimester: Secondary | ICD-10-CM

## 2019-06-30 DIAGNOSIS — O34219 Maternal care for unspecified type scar from previous cesarean delivery: Secondary | ICD-10-CM

## 2019-06-30 DIAGNOSIS — O43193 Other malformation of placenta, third trimester: Secondary | ICD-10-CM

## 2019-06-30 DIAGNOSIS — O09522 Supervision of elderly multigravida, second trimester: Secondary | ICD-10-CM

## 2019-06-30 DIAGNOSIS — B951 Streptococcus, group B, as the cause of diseases classified elsewhere: Secondary | ICD-10-CM

## 2019-06-30 DIAGNOSIS — Z362 Encounter for other antenatal screening follow-up: Secondary | ICD-10-CM

## 2019-06-30 DIAGNOSIS — Z8759 Personal history of other complications of pregnancy, childbirth and the puerperium: Secondary | ICD-10-CM | POA: Diagnosis not present

## 2019-06-30 DIAGNOSIS — O2343 Unspecified infection of urinary tract in pregnancy, third trimester: Secondary | ICD-10-CM | POA: Diagnosis not present

## 2019-06-30 DIAGNOSIS — O43199 Other malformation of placenta, unspecified trimester: Secondary | ICD-10-CM

## 2019-06-30 DIAGNOSIS — O3412 Maternal care for benign tumor of corpus uteri, second trimester: Secondary | ICD-10-CM

## 2019-06-30 DIAGNOSIS — O99212 Obesity complicating pregnancy, second trimester: Secondary | ICD-10-CM

## 2019-06-30 DIAGNOSIS — Z98891 History of uterine scar from previous surgery: Secondary | ICD-10-CM

## 2019-06-30 DIAGNOSIS — O09292 Supervision of pregnancy with other poor reproductive or obstetric history, second trimester: Secondary | ICD-10-CM | POA: Diagnosis not present

## 2019-06-30 NOTE — Progress Notes (Signed)
   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  She is currently monitored for the following issues for this high-risk pregnancy and has GBS (group B streptococcus) UTI complicating pregnancy; Supervision of high risk pregnancy, antepartum; Chronic hypertension affecting pregnancy; History of 2 cesarean sections; Language barrier; AMA (advanced maternal age) multigravida 43+; Leiomyoma of uterus; History of postpartum pre-eclampsia; and Marginal insertion of umbilical cord affecting management of mother on their problem list.  Patient reports no complaints.  Contractions: Not present. Vag. Bleeding: None.  Movement: Present. Denies leaking of fluid.   The following portions of the patient's history were reviewed and updated as appropriate: allergies, current medications, past family history, past medical history, past social history, past surgical history and problem list.   Objective:   Vitals:   06/30/19 0842  BP: 120/79  Pulse: 88  Weight: 208 lb 12.8 oz (94.7 kg)    Fetal Status: Fetal Heart Rate (bpm): 158   Movement: Present     General:  Alert, oriented and cooperative. Patient is in no acute distress.  Skin: Skin is warm and dry. No rash noted.   Cardiovascular: Normal heart rate noted  Respiratory: Normal respiratory effort, no problems with respiration noted  Abdomen: Soft, gravid, appropriate for gestational age.  Pain/Pressure: Absent     Pelvic: Cervical exam deferred        Extremities: Normal range of motion.  Edema: None  Mental Status: Normal mood and affect. Normal behavior. Normal judgment and thought content.   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 Supervision of high risk pregnancy, antepartum - CBC - HIV - RPR - 2 hr GTT - Flu Vaccine QUAD 36+ mos IM (Fluarix, Quad PF) - Tdap vaccine greater than or equal to 7yo IM  2. History of 2 cesarean sections RCS  scheduled today for 39 weeks  3. Language barrier [place] interpretor used  4. Multigravida of advanced maternal age in third trimester  5. History of postpartum pre-eclampsia  6. Marginal insertion of umbilical cord affecting management of mother Has f/u US ordered  7. Group B Streptococcus urinary tract infection affecting pregnancy in third trimester  8. Chronic hypertension affecting pregnancy BP stable no meds Growth US wnl Has f/u for 4 weeks Cont baby ASA   Preterm labor symptoms and general obstetric precautions including but not limited to vaginal bleeding, contractions, leaking of fluid and fetal movement were reviewed in detail with the patient. Please refer to After Visit Summary for other counseling recommendations.   Return in about 2 weeks (around 07/14/2019) for high OB, in person.  Future Appointments  Date Time Provider [place]  07/14/2019  9:15 AM Emily Filbert, MD [facility] WOC  07/28/2019  9:15 AM [place] NURSE [place] MFC-US  07/28/2019  9:15 AM [place] 4 WH-MFCUS MFC-US    Sloan Leiter, MD

## 2019-07-01 LAB — CBC
Hematocrit: 38.5 % (ref 34.0–46.6)
Hemoglobin: 12.7 g/dL (ref 11.1–15.9)
MCH: 28.6 pg (ref 26.6–33.0)
MCHC: 33 g/dL (ref 31.5–35.7)
MCV: 87 fL (ref 79–97)
Platelets: 180 10*3/uL (ref 150–450)
RBC: 4.44 x10E6/uL (ref 3.77–5.28)
RDW: 14.2 % (ref 11.7–15.4)
WBC: 6.2 10*3/uL (ref 3.4–10.8)

## 2019-07-01 LAB — HIV ANTIBODY (ROUTINE TESTING W REFLEX): HIV Screen 4th Generation wRfx: NONREACTIVE

## 2019-07-01 LAB — GLUCOSE TOLERANCE, 2 HOURS W/ 1HR
Glucose, 1 hour: 136 mg/dL (ref 65–179)
Glucose, 2 hour: 128 mg/dL (ref 65–152)
Glucose, Fasting: 79 mg/dL (ref 65–91)

## 2019-07-01 LAB — RPR: RPR Ser Ql: NONREACTIVE

## 2019-07-14 ENCOUNTER — Ambulatory Visit (INDEPENDENT_AMBULATORY_CARE_PROVIDER_SITE_OTHER): Payer: Medicaid Other | Admitting: Obstetrics & Gynecology

## 2019-07-14 ENCOUNTER — Other Ambulatory Visit: Payer: Self-pay

## 2019-07-14 VITALS — BP 118/73 | HR 117 | Wt 206.6 lb

## 2019-07-14 DIAGNOSIS — O43193 Other malformation of placenta, third trimester: Secondary | ICD-10-CM

## 2019-07-14 DIAGNOSIS — O0993 Supervision of high risk pregnancy, unspecified, third trimester: Secondary | ICD-10-CM

## 2019-07-14 DIAGNOSIS — O99210 Obesity complicating pregnancy, unspecified trimester: Secondary | ICD-10-CM

## 2019-07-14 DIAGNOSIS — O43199 Other malformation of placenta, unspecified trimester: Secondary | ICD-10-CM

## 2019-07-14 DIAGNOSIS — O10919 Unspecified pre-existing hypertension complicating pregnancy, unspecified trimester: Secondary | ICD-10-CM

## 2019-07-14 DIAGNOSIS — Z789 Other specified health status: Secondary | ICD-10-CM

## 2019-07-14 DIAGNOSIS — O09523 Supervision of elderly multigravida, third trimester: Secondary | ICD-10-CM

## 2019-07-14 DIAGNOSIS — O0990 Supervision of high risk pregnancy, unspecified, unspecified trimester: Secondary | ICD-10-CM

## 2019-07-14 DIAGNOSIS — Z3A30 30 weeks gestation of pregnancy: Secondary | ICD-10-CM

## 2019-07-14 DIAGNOSIS — O10913 Unspecified pre-existing hypertension complicating pregnancy, third trimester: Secondary | ICD-10-CM

## 2019-07-14 DIAGNOSIS — O09522 Supervision of elderly multigravida, second trimester: Secondary | ICD-10-CM

## 2019-07-14 DIAGNOSIS — O99213 Obesity complicating pregnancy, third trimester: Secondary | ICD-10-CM

## 2019-07-14 DIAGNOSIS — Z98891 History of uterine scar from previous surgery: Secondary | ICD-10-CM

## 2019-07-14 NOTE — Progress Notes (Signed)
   PRENATAL VISIT NOTE  Subjective:  Anne Murillo is a 33 y.o. [id number] at [redacted]w[redacted]d being seen today for ongoing prenatal care.  She is currently monitored for the following issues for this high-risk pregnancy and has GBS (group B streptococcus) UTI complicating pregnancy; Supervision of high risk pregnancy, antepartum; Chronic hypertension affecting pregnancy; History of 2 cesarean sections; Language barrier; AMA (advanced maternal age) multigravida 47+; Leiomyoma of uterus; History of postpartum pre-eclampsia; Marginal insertion of umbilical cord affecting management of mother; and Obesity in pregnancy on their problem list.  Patient reports no complaints.  Contractions: Not present. Vag. Bleeding: None.  Movement: Present. Denies leaking of fluid.   The following portions of the patient's history were reviewed and updated as appropriate: allergies, current medications, past family history, past medical history, past social history, past surgical history and problem list.   Objective:   Vitals:   07/14/19 0915  BP: 118/73  Pulse: (!) 117  Weight: 206 lb 9.6 oz (93.7 kg)    Fetal Status: Fetal Heart Rate (bpm): 147   Movement: Present     General:  Alert, oriented and cooperative. Patient is in no acute distress.  Skin: Skin is warm and dry. No rash noted.   Cardiovascular: Normal heart rate noted  Respiratory: Normal respiratory effort, no problems with respiration noted  Abdomen: Soft, gravid, appropriate for gestational age.  Pain/Pressure: Present     Pelvic: Cervical exam deferred        Extremities: Normal range of motion.  Edema: None  Mental Status: Normal mood and affect. Normal behavior. Normal judgment and thought content.   Assessment and Plan:  Pregnancy: [id number] at [redacted]w[redacted]d 1. Supervision of high risk pregnancy, antepartum   2. Language barrier - [employer] interpretor used for visit  3. History of 2 cesarean sections - repeat at 39 week  4. Marginal insertion of  umbilical cord affecting management of mother   5. Chronic hypertension affecting pregnancy -on baby asa daily - mfm [place] on 07/28/19  6. Multigravida of advanced maternal age in second trimester   7. Obesity in pregnancy   Preterm labor symptoms and general obstetric precautions including but not limited to vaginal bleeding, contractions, leaking of fluid and fetal movement were reviewed in detail with the patient. Please refer to After Visit Summary for other counseling recommendations.   Return in about 2 weeks (around 07/28/2019) for in person.  Future Appointments  Date Time Provider [place]  07/28/2019  9:15 AM [place] MFC-US  07/28/2019  9:15 AM WH-MFC [place] 4 WH-MFCUS MFC-US    Emily Filbert, MD

## 2019-07-28 ENCOUNTER — Ambulatory Visit (HOSPITAL_COMMUNITY)
Admission: RE | Admit: 2019-07-28 | Discharge: 2019-07-28 | Disposition: A | Discharge status: Home / Self Care | Payer: Medicaid Other | Source: Ambulatory Visit | Attending: Obstetrics and Gynecology | Admitting: Obstetrics and Gynecology

## 2019-07-28 ENCOUNTER — Other Ambulatory Visit (HOSPITAL_COMMUNITY): Payer: Self-pay | Admitting: *Deleted

## 2019-07-28 ENCOUNTER — Other Ambulatory Visit (HOSPITAL_COMMUNITY): Payer: Self-pay | Admitting: Obstetrics and Gynecology

## 2019-07-28 ENCOUNTER — Encounter (HOSPITAL_COMMUNITY): Payer: Self-pay

## 2019-07-28 ENCOUNTER — Other Ambulatory Visit: Payer: Self-pay

## 2019-07-28 ENCOUNTER — Ambulatory Visit (HOSPITAL_COMMUNITY): Payer: Medicaid Other | Admitting: *Deleted

## 2019-07-28 DIAGNOSIS — O0990 Supervision of high risk pregnancy, unspecified, unspecified trimester: Secondary | ICD-10-CM | POA: Insufficient documentation

## 2019-07-28 DIAGNOSIS — O09293 Supervision of pregnancy with other poor reproductive or obstetric history, third trimester: Secondary | ICD-10-CM | POA: Diagnosis not present

## 2019-07-28 DIAGNOSIS — Z362 Encounter for other antenatal screening follow-up: Secondary | ICD-10-CM

## 2019-07-28 DIAGNOSIS — O99210 Obesity complicating pregnancy, unspecified trimester: Secondary | ICD-10-CM | POA: Diagnosis present

## 2019-07-28 DIAGNOSIS — O10919 Unspecified pre-existing hypertension complicating pregnancy, unspecified trimester: Secondary | ICD-10-CM | POA: Insufficient documentation

## 2019-07-28 DIAGNOSIS — O99213 Obesity complicating pregnancy, third trimester: Secondary | ICD-10-CM

## 2019-07-28 DIAGNOSIS — O34219 Maternal care for unspecified type scar from previous cesarean delivery: Secondary | ICD-10-CM

## 2019-07-28 DIAGNOSIS — O10013 Pre-existing essential hypertension complicating pregnancy, third trimester: Secondary | ICD-10-CM

## 2019-07-28 DIAGNOSIS — O09529 Supervision of elderly multigravida, unspecified trimester: Secondary | ICD-10-CM

## 2019-07-28 DIAGNOSIS — O09523 Supervision of elderly multigravida, third trimester: Secondary | ICD-10-CM | POA: Diagnosis not present

## 2019-07-28 DIAGNOSIS — Z3A32 32 weeks gestation of pregnancy: Secondary | ICD-10-CM

## 2019-08-01 ENCOUNTER — Telehealth: Payer: Self-pay | Admitting: Obstetrics and Gynecology

## 2019-08-01 NOTE — Telephone Encounter (Signed)
Attempted to call patient w/ [place] interpreter ID# [phone] about her appointment on 8/27 @ 11:15. No answer, interpreter left voicemail instructing patient to wear a face mask for the entire appointment and no visitors are allowed during the visit. Patient instructed not to attend the appointment if she was any symptoms. Symptom list and office number left.

## 2019-08-02 ENCOUNTER — Ambulatory Visit (HOSPITAL_COMMUNITY): Payer: Medicaid Other | Admitting: *Deleted

## 2019-08-02 ENCOUNTER — Other Ambulatory Visit: Payer: Self-pay

## 2019-08-02 ENCOUNTER — Ambulatory Visit (HOSPITAL_COMMUNITY)
Admission: RE | Admit: 2019-08-02 | Discharge: 2019-08-02 | Disposition: A | Discharge status: Home / Self Care | Payer: Medicaid Other | Source: Ambulatory Visit | Attending: Obstetrics and Gynecology | Admitting: Obstetrics and Gynecology

## 2019-08-02 ENCOUNTER — Encounter: Payer: Self-pay | Admitting: Obstetrics and Gynecology

## 2019-08-02 ENCOUNTER — Encounter (HOSPITAL_COMMUNITY): Payer: Self-pay

## 2019-08-02 ENCOUNTER — Ambulatory Visit (INDEPENDENT_AMBULATORY_CARE_PROVIDER_SITE_OTHER): Payer: Medicaid Other | Admitting: Obstetrics and Gynecology

## 2019-08-02 VITALS — BP 120/72 | HR 99 | Wt 209.0 lb

## 2019-08-02 DIAGNOSIS — O09293 Supervision of pregnancy with other poor reproductive or obstetric history, third trimester: Secondary | ICD-10-CM | POA: Diagnosis not present

## 2019-08-02 DIAGNOSIS — Z3A32 32 weeks gestation of pregnancy: Secondary | ICD-10-CM

## 2019-08-02 DIAGNOSIS — O09523 Supervision of elderly multigravida, third trimester: Secondary | ICD-10-CM

## 2019-08-02 DIAGNOSIS — O43193 Other malformation of placenta, third trimester: Secondary | ICD-10-CM

## 2019-08-02 DIAGNOSIS — O0990 Supervision of high risk pregnancy, unspecified, unspecified trimester: Secondary | ICD-10-CM | POA: Diagnosis present

## 2019-08-02 DIAGNOSIS — O99210 Obesity complicating pregnancy, unspecified trimester: Secondary | ICD-10-CM | POA: Insufficient documentation

## 2019-08-02 DIAGNOSIS — O09529 Supervision of elderly multigravida, unspecified trimester: Secondary | ICD-10-CM | POA: Diagnosis not present

## 2019-08-02 DIAGNOSIS — D259 Leiomyoma of uterus, unspecified: Secondary | ICD-10-CM

## 2019-08-02 DIAGNOSIS — O99213 Obesity complicating pregnancy, third trimester: Secondary | ICD-10-CM | POA: Diagnosis not present

## 2019-08-02 DIAGNOSIS — B951 Streptococcus, group B, as the cause of diseases classified elsewhere: Secondary | ICD-10-CM

## 2019-08-02 DIAGNOSIS — O10913 Unspecified pre-existing hypertension complicating pregnancy, third trimester: Secondary | ICD-10-CM

## 2019-08-02 DIAGNOSIS — O3413 Maternal care for benign tumor of corpus uteri, third trimester: Secondary | ICD-10-CM

## 2019-08-02 DIAGNOSIS — O34219 Maternal care for unspecified type scar from previous cesarean delivery: Secondary | ICD-10-CM

## 2019-08-02 DIAGNOSIS — O10013 Pre-existing essential hypertension complicating pregnancy, third trimester: Secondary | ICD-10-CM

## 2019-08-02 DIAGNOSIS — O10919 Unspecified pre-existing hypertension complicating pregnancy, unspecified trimester: Secondary | ICD-10-CM

## 2019-08-02 DIAGNOSIS — O2343 Unspecified infection of urinary tract in pregnancy, third trimester: Secondary | ICD-10-CM

## 2019-08-02 DIAGNOSIS — O0993 Supervision of high risk pregnancy, unspecified, third trimester: Secondary | ICD-10-CM

## 2019-08-02 NOTE — Addendum Note (Signed)
Addended by: Shelly Coss on: 08/02/2019 09:34 AM   Modules accepted: Orders

## 2019-08-02 NOTE — Progress Notes (Signed)
   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  She is currently monitored for the following issues for this high-risk pregnancy and has GBS (group B streptococcus) UTI complicating pregnancy; Supervision of high risk pregnancy, antepartum; Chronic hypertension affecting pregnancy; History of 2 cesarean sections; Language barrier; AMA (advanced maternal age) multigravida 9+; Leiomyoma of uterus; History of postpartum pre-eclampsia; Marginal insertion of umbilical cord affecting management of mother; and Obesity in pregnancy on their problem list.  Patient reports no complaints.  Contractions: Not present. Vag. Bleeding: None.  Movement: Present. Denies leaking of fluid.   The following portions of the patient's history were reviewed and updated as appropriate: allergies, current medications, past family history, past medical history, past social history, past surgical history and problem list.   Objective:   Vitals:   08/02/19 0838  BP: 120/72  Pulse: 99  Weight: 209 lb (94.8 kg)    Fetal Status: Fetal Heart Rate (bpm): 147 Fundal Height: 32 cm Movement: Present     General:  Alert, oriented and cooperative. Patient is in no acute distress.  Skin: Skin is warm and dry. No rash noted.   Cardiovascular: Normal heart rate noted  Respiratory: Normal respiratory effort, no problems with respiration noted  Abdomen: Soft, gravid, appropriate for gestational age.  Pain/Pressure: Absent     Pelvic: Cervical exam deferred        Extremities: Normal range of motion.  Edema: None  Mental Status: Normal mood and affect. Normal behavior. Normal judgment and thought content.   Assessment and Plan:  Pregnancy: [id number] at [redacted]w[redacted]d 1. Supervision of high risk pregnancy, antepartum Patient is doing well without complaints Patient scheduled for repeat c-section. She remains undecided on contraception - Culture, OB Urine   2. Multigravida of advanced maternal age in third trimester   3. Group B Streptococcus urinary tract infection affecting pregnancy in third trimester  - Culture, OB Urine  4. Chronic hypertension affecting pregnancy Normotensive without medication Continue ASA Continue weekly antenatal testing. NST to start next week as patient is not able to stay today  Preterm labor symptoms and general obstetric precautions including but not limited to vaginal bleeding, contractions, leaking of fluid and fetal movement were reviewed in detail with the patient. Please refer to After Visit Summary for other counseling recommendations.   Return in about 1 week (around 08/09/2019) for in person, ROB, NST.  Future Appointments  Date Time Provider [place]  08/08/2019  7:30 AM WH-MFC [place] 2 WH-MFCUS MFC-US  08/08/2019  7:40 AM WH-MFC NURSE WH-MFC MFC-US  08/15/2019  8:30 AM WH-MFC [place] 5 WH-MFCUS MFC-US  08/15/2019  9:15 AM WH-MFC NURSE WH-MFC MFC-US  08/25/2019  8:15 AM WH-MFC NURSE WH-MFC MFC-US  08/25/2019  8:15 AM WH-MFC [place] 4 WH-MFCUS MFC-US    Mora Bellman, MD

## 2019-08-08 ENCOUNTER — Other Ambulatory Visit: Payer: Self-pay

## 2019-08-08 ENCOUNTER — Ambulatory Visit (HOSPITAL_COMMUNITY)
Admission: RE | Admit: 2019-08-08 | Discharge: 2019-08-08 | Disposition: A | Discharge status: Home / Self Care | Payer: Medicaid Other | Source: Ambulatory Visit | Attending: Obstetrics and Gynecology | Admitting: Obstetrics and Gynecology

## 2019-08-08 ENCOUNTER — Ambulatory Visit (HOSPITAL_COMMUNITY): Payer: Medicaid Other | Admitting: *Deleted

## 2019-08-08 ENCOUNTER — Encounter (HOSPITAL_COMMUNITY): Payer: Self-pay

## 2019-08-08 DIAGNOSIS — O99210 Obesity complicating pregnancy, unspecified trimester: Secondary | ICD-10-CM | POA: Diagnosis present

## 2019-08-08 DIAGNOSIS — O0990 Supervision of high risk pregnancy, unspecified, unspecified trimester: Secondary | ICD-10-CM | POA: Insufficient documentation

## 2019-08-08 DIAGNOSIS — O09293 Supervision of pregnancy with other poor reproductive or obstetric history, third trimester: Secondary | ICD-10-CM

## 2019-08-08 DIAGNOSIS — O09523 Supervision of elderly multigravida, third trimester: Secondary | ICD-10-CM | POA: Diagnosis not present

## 2019-08-08 DIAGNOSIS — O43193 Other malformation of placenta, third trimester: Secondary | ICD-10-CM

## 2019-08-08 DIAGNOSIS — Z3A33 33 weeks gestation of pregnancy: Secondary | ICD-10-CM

## 2019-08-08 DIAGNOSIS — O3413 Maternal care for benign tumor of corpus uteri, third trimester: Secondary | ICD-10-CM

## 2019-08-08 DIAGNOSIS — D259 Leiomyoma of uterus, unspecified: Secondary | ICD-10-CM

## 2019-08-08 DIAGNOSIS — O09529 Supervision of elderly multigravida, unspecified trimester: Secondary | ICD-10-CM | POA: Insufficient documentation

## 2019-08-08 DIAGNOSIS — O10013 Pre-existing essential hypertension complicating pregnancy, third trimester: Secondary | ICD-10-CM

## 2019-08-08 DIAGNOSIS — O99213 Obesity complicating pregnancy, third trimester: Secondary | ICD-10-CM | POA: Diagnosis not present

## 2019-08-08 DIAGNOSIS — O34219 Maternal care for unspecified type scar from previous cesarean delivery: Secondary | ICD-10-CM

## 2019-08-14 ENCOUNTER — Other Ambulatory Visit: Payer: Self-pay

## 2019-08-14 ENCOUNTER — Ambulatory Visit: Payer: Medicaid Other | Admitting: *Deleted

## 2019-08-14 ENCOUNTER — Ambulatory Visit: Payer: Medicaid Other | Admitting: Obstetrics & Gynecology

## 2019-08-14 VITALS — BP 119/82 | HR 105 | Wt 208.0 lb

## 2019-08-14 DIAGNOSIS — O10919 Unspecified pre-existing hypertension complicating pregnancy, unspecified trimester: Secondary | ICD-10-CM

## 2019-08-14 DIAGNOSIS — O09523 Supervision of elderly multigravida, third trimester: Secondary | ICD-10-CM

## 2019-08-14 DIAGNOSIS — O0990 Supervision of high risk pregnancy, unspecified, unspecified trimester: Secondary | ICD-10-CM

## 2019-08-14 DIAGNOSIS — Z98891 History of uterine scar from previous surgery: Secondary | ICD-10-CM

## 2019-08-14 DIAGNOSIS — O99210 Obesity complicating pregnancy, unspecified trimester: Secondary | ICD-10-CM

## 2019-08-14 NOTE — Progress Notes (Signed)
Per Dr. Donalee Citrin, pt does not require fetal testing as scheduled. NST/BPP cancelled. Pt has Hx of CHTN however has normal BP's, does not require medication and does not have additional co-morbidities which would warrant fetal testing.

## 2019-08-14 NOTE — Patient Instructions (Signed)
Cesarean Delivery Cesarean birth, or cesarean delivery, is the surgical delivery of a baby through an incision in the abdomen and the uterus. This may be referred to as a C-section. This procedure may be scheduled ahead of time, or it may be done in an emergency situation. Tell a health care provider about:  Any allergies you have.  All medicines you are taking, including vitamins, herbs, eye drops, creams, and over-the-counter medicines.  Any problems you or family members have had with anesthetic medicines.  Any blood disorders you have.  Any surgeries you have had.  Any medical conditions you have.  Whether you or any members of your family have a history of deep vein thrombosis (DVT) or pulmonary embolism (PE). What are the risks? Generally, this is a safe procedure. However, problems may occur, including:  Infection.  Bleeding.  Allergic reactions to medicines.  Damage to other structures or organs.  Blood clots.  Injury to your baby. What happens before the procedure? General instructions  Follow instructions from your health care provider about eating or drinking restrictions.  If you know that you are going to have a cesarean delivery, do not shave your pubic area. Shaving before the procedure may increase your risk of infection.  Plan to have someone take you home from the hospital.  Ask your health care provider what steps will be taken to prevent infection. These may include: ? Removing hair at the surgery site. ? Washing skin with a germ-killing soap. ? Taking antibiotic medicine.  Depending on the reason for your cesarean delivery, you may have a physical exam or additional testing, such as an ultrasound.  You may have your blood or urine tested. Questions for your health care provider  Ask your health care provider about: ? Changing or stopping your regular medicines. This is especially important if you are taking diabetes medicines or blood thinners.  ? Your pain management plan. This is especially important if you plan to breastfeed your baby. ? How long you will be in the hospital after the procedure. ? Any concerns you may have about receiving blood products, if you need them during the procedure. ? Cord blood banking, if you plan to collect your baby's umbilical cord blood.  You may also want to ask your health care provider: ? Whether you will be able to hold or breastfeed your baby while you are still in the operating room. ? Whether your baby can stay with you immediately after the procedure and during your recovery. ? Whether a family member or a person of your choice can go with you into the operating room and stay with you during the procedure, immediately after the procedure, and during your recovery. What happens during the procedure?   An IV will be inserted into one of your veins.  Fluid and medicines, such as antibiotics, will be given before the surgery.  Fetal monitors will be placed on your abdomen to check your baby's heart rate.  You may be given a special warming gown to wear to keep your temperature stable.  A catheter may be inserted into your bladder through your urethra. This drains your urine during the procedure.  You may be given one or more of the following: ? A medicine to numb the area (local anesthetic). ? A medicine to make you fall asleep (general anesthetic). ? A medicine (regional anesthetic) that is injected into your back or through a small thin tube placed in your back (spinal anesthetic or epidural anesthetic).  This numbs everything below the injection site and allows you to stay awake during your procedure. If this makes you feel nauseous, tell your health care provider. Medicines will be available to help reduce any nausea you may feel.  An incision will be made in your abdomen, and then in your uterus.  If you are awake during your procedure, you may feel tugging and pulling in your abdomen,  but you should not feel pain. If you feel pain, tell your health care provider immediately.  Your baby will be removed from your uterus. You may feel more pressure or pushing while this happens.  Immediately after birth, your baby will be dried and kept warm. You may be able to hold and breastfeed your baby.  The umbilical cord may be clamped and cut during this time. This usually occurs after waiting a period of 1-2 minutes after delivery.  Your placenta will be removed from your uterus.  Your incisions will be closed with stitches (sutures). Staples, skin glue, or adhesive strips may also be applied to the incision in your abdomen.  Bandages (dressings) may be placed over the incision in your abdomen. The procedure may vary among health care providers and hospitals. What happens after the procedure?  Your blood pressure, heart rate, breathing rate, and blood oxygen level will be monitored until you are discharged from the hospital.  You may continue to receive fluids and medicines through an IV.  You will have some pain. Medicines will be available to help control your pain.  To help prevent blood clots: ? You may be given medicines. ? You may have to wear compression stockings or devices. ? You will be encouraged to walk around when you are able.  Hospital staff will encourage and support bonding with your baby. Your hospital may have you and your baby to stay in the same room (rooming in) during your hospital stay to encourage successful bonding and breastfeeding.  You may be encouraged to cough and breathe deeply often. This helps to prevent lung problems.  If you have a catheter draining your urine, it will be removed as soon as possible after your procedure. Summary  Cesarean birth, or cesarean delivery, is the surgical delivery of a baby through an incision in the abdomen and the uterus.  Follow instructions from your health care provider about eating or drinking  restrictions before the procedure.  You will have some pain after the procedure. Medicines will be available to help control your pain.  Hospital staff will encourage and support bonding with your baby after the procedure. Your hospital may have you and your baby to stay in the same room (rooming in) during your hospital stay to encourage successful bonding and breastfeeding. This information is not intended to replace advice given to you by your health care provider. Make sure you discuss any questions you have with your health care provider. Document Released: 08/31/2005 Document Revised: 03/07/2018 Document Reviewed: 03/07/2018 Elsevier Patient Education  2020 [employer].

## 2019-08-15 ENCOUNTER — Ambulatory Visit (HOSPITAL_COMMUNITY): Payer: Medicaid Other

## 2019-08-25 ENCOUNTER — Ambulatory Visit (HOSPITAL_COMMUNITY): Payer: Medicaid Other | Admitting: *Deleted

## 2019-08-25 ENCOUNTER — Encounter (HOSPITAL_COMMUNITY): Payer: Self-pay

## 2019-08-25 ENCOUNTER — Other Ambulatory Visit: Payer: Self-pay

## 2019-08-25 ENCOUNTER — Ambulatory Visit (HOSPITAL_COMMUNITY)
Admission: RE | Admit: 2019-08-25 | Discharge: 2019-08-25 | Disposition: A | Discharge status: Home / Self Care | Payer: Medicaid Other | Source: Ambulatory Visit | Attending: Obstetrics and Gynecology | Admitting: Obstetrics and Gynecology

## 2019-08-25 DIAGNOSIS — O0990 Supervision of high risk pregnancy, unspecified, unspecified trimester: Secondary | ICD-10-CM | POA: Diagnosis present

## 2019-08-25 DIAGNOSIS — O99210 Obesity complicating pregnancy, unspecified trimester: Secondary | ICD-10-CM

## 2019-08-25 DIAGNOSIS — O3413 Maternal care for benign tumor of corpus uteri, third trimester: Secondary | ICD-10-CM

## 2019-08-25 DIAGNOSIS — O09529 Supervision of elderly multigravida, unspecified trimester: Secondary | ICD-10-CM | POA: Insufficient documentation

## 2019-08-25 DIAGNOSIS — D259 Leiomyoma of uterus, unspecified: Secondary | ICD-10-CM

## 2019-08-25 DIAGNOSIS — O09293 Supervision of pregnancy with other poor reproductive or obstetric history, third trimester: Secondary | ICD-10-CM

## 2019-08-25 DIAGNOSIS — O10013 Pre-existing essential hypertension complicating pregnancy, third trimester: Secondary | ICD-10-CM

## 2019-08-25 DIAGNOSIS — Z3A36 36 weeks gestation of pregnancy: Secondary | ICD-10-CM

## 2019-08-25 DIAGNOSIS — O09523 Supervision of elderly multigravida, third trimester: Secondary | ICD-10-CM

## 2019-08-25 DIAGNOSIS — O43193 Other malformation of placenta, third trimester: Secondary | ICD-10-CM

## 2019-08-25 DIAGNOSIS — O99213 Obesity complicating pregnancy, third trimester: Secondary | ICD-10-CM | POA: Diagnosis not present

## 2019-08-25 DIAGNOSIS — Z362 Encounter for other antenatal screening follow-up: Secondary | ICD-10-CM

## 2019-08-25 DIAGNOSIS — O34219 Maternal care for unspecified type scar from previous cesarean delivery: Secondary | ICD-10-CM

## 2019-08-29 ENCOUNTER — Other Ambulatory Visit: Payer: Self-pay

## 2019-08-29 ENCOUNTER — Other Ambulatory Visit (HOSPITAL_COMMUNITY)
Admission: RE | Admit: 2019-08-29 | Discharge: 2019-08-29 | Disposition: A | Discharge status: Home / Self Care | Payer: Medicaid Other | Source: Ambulatory Visit | Attending: Obstetrics and Gynecology | Admitting: Obstetrics and Gynecology

## 2019-08-29 ENCOUNTER — Ambulatory Visit (INDEPENDENT_AMBULATORY_CARE_PROVIDER_SITE_OTHER): Payer: Medicaid Other | Admitting: Obstetrics and Gynecology

## 2019-08-29 VITALS — BP 106/71 | HR 104 | Wt 208.0 lb

## 2019-08-29 DIAGNOSIS — Z98891 History of uterine scar from previous surgery: Secondary | ICD-10-CM

## 2019-08-29 DIAGNOSIS — O43199 Other malformation of placenta, unspecified trimester: Secondary | ICD-10-CM

## 2019-08-29 DIAGNOSIS — O2343 Unspecified infection of urinary tract in pregnancy, third trimester: Secondary | ICD-10-CM

## 2019-08-29 DIAGNOSIS — O0990 Supervision of high risk pregnancy, unspecified, unspecified trimester: Secondary | ICD-10-CM

## 2019-08-29 DIAGNOSIS — O99213 Obesity complicating pregnancy, third trimester: Secondary | ICD-10-CM

## 2019-08-29 DIAGNOSIS — O09523 Supervision of elderly multigravida, third trimester: Secondary | ICD-10-CM

## 2019-08-29 DIAGNOSIS — B951 Streptococcus, group B, as the cause of diseases classified elsewhere: Secondary | ICD-10-CM

## 2019-08-29 DIAGNOSIS — O10913 Unspecified pre-existing hypertension complicating pregnancy, third trimester: Secondary | ICD-10-CM

## 2019-08-29 DIAGNOSIS — O3663X0 Maternal care for excessive fetal growth, third trimester, not applicable or unspecified: Secondary | ICD-10-CM

## 2019-08-29 DIAGNOSIS — O10919 Unspecified pre-existing hypertension complicating pregnancy, unspecified trimester: Secondary | ICD-10-CM

## 2019-08-29 DIAGNOSIS — Z8679 Personal history of other diseases of the circulatory system: Secondary | ICD-10-CM

## 2019-08-29 DIAGNOSIS — O0993 Supervision of high risk pregnancy, unspecified, third trimester: Secondary | ICD-10-CM

## 2019-08-29 DIAGNOSIS — D259 Leiomyoma of uterus, unspecified: Secondary | ICD-10-CM

## 2019-08-29 DIAGNOSIS — Z789 Other specified health status: Secondary | ICD-10-CM

## 2019-08-29 DIAGNOSIS — O43193 Other malformation of placenta, third trimester: Secondary | ICD-10-CM

## 2019-08-29 DIAGNOSIS — Z3A36 36 weeks gestation of pregnancy: Secondary | ICD-10-CM

## 2019-08-29 DIAGNOSIS — Z8669 Personal history of other diseases of the nervous system and sense organs: Secondary | ICD-10-CM

## 2019-08-29 DIAGNOSIS — Z8759 Personal history of other complications of pregnancy, childbirth and the puerperium: Secondary | ICD-10-CM

## 2019-08-29 DIAGNOSIS — O99210 Obesity complicating pregnancy, unspecified trimester: Secondary | ICD-10-CM

## 2019-08-29 MED ORDER — BACITRACIN-POLYMYXIN B 500-10000 UNIT/GM OP OINT: 1.0000 "application " | TOPICAL_OINTMENT | Freq: Three times a day (TID) | OPHTHALMIC | 0 refills | Status: AC

## 2019-08-29 MED ORDER — PREPLUS 27-1 MG PO TABS: 1.0000 | ORAL_TABLET | Freq: Every day | ORAL | 4 refills | Status: DC

## 2019-08-29 NOTE — Progress Notes (Signed)
Prenatal Visit Note Date: 08/29/2019 Clinic: [facility]  Subjective:  Anne Murillo is a 33 y.o. 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  She is currently monitored for the following issues for this high-risk pregnancy and has GBS (group B streptococcus) UTI complicating pregnancy; Supervision of high risk pregnancy, antepartum; Chronic hypertension affecting pregnancy; History of 2 cesarean sections; Language barrier; AMA (advanced maternal age) multigravida 56+; Leiomyoma of uterus; History of postpartum pre-eclampsia; Marginal insertion of umbilical cord affecting management of mother; Obesity in pregnancy; and Excessive fetal growth affecting management of mother in third trimester, antepartum on their problem list.  Patient reports occasional left sciatic discomfort, left eye itching and redness.   Contractions: Not present. Vag. Bleeding: None.  Movement: Present. Denies leaking of fluid.   The following portions of the patient's history were reviewed and updated as appropriate: allergies, current medications, past family history, past medical history, past social history, past surgical history and problem list. Problem list updated.  Objective:   Vitals:   08/29/19 0818  BP: 106/71  Pulse: (!) 104  Weight: 208 lb (94.3 kg)    Fetal Status: Fetal Heart Rate (bpm): 143   Movement: Present     General:  Alert, oriented and cooperative. Patient is in no acute distress.  Skin: Skin is warm and dry. No rash noted.   Cardiovascular: Normal heart rate noted  Respiratory: Normal respiratory effort, no problems with respiration noted  Abdomen: Soft, gravid, appropriate for gestational age. Pain/Pressure: Present     Pelvic:  Cervical exam deferred        Extremities: Normal range of motion.  Edema: None  Mental Status: Normal mood and affect. Normal behavior. Normal judgment and thought content.   Urinalysis:      Assessment and Plan:   Pregnancy: [id number] at [redacted]w[redacted]d  1. Supervision of high risk pregnancy, antepartum Routine care. Does not want BTL - GC/Chlamydia probe amp (Jacobus)not at [facility] - Culture, beta strep (group b only)  2. Chronic hypertension affecting pregnancy Doing well on no meds so no need for AP testing per new guidelines. Normal AFI, LGA on 12/11  3. Obesity in pregnancy  4. Marginal insertion of umbilical cord affecting management of mother  5. Multigravida of advanced maternal age in third trimester No issues  6. Language barrier Interpreter used  7. History of 2 cesarean sections Already scheduled  8. Group B Streptococcus urinary tract infection affecting pregnancy in third trimester Low count, no need to tx  9. History of postpartum pre-eclampsia Continue low dose asa  10. Uterine leiomyoma, unspecified location No issues  11. Excessive fetal growth affecting management of pregnancy in third trimester, single or unspecified fetus Normal GDM screening  12. History of itching of eye gtt sent in and d/w re: hand hygiene  Preterm labor symptoms and general obstetric precautions including but not limited to vaginal bleeding, contractions, leaking of fluid and fetal movement were reviewed in detail with the patient. Please refer to After Visit Summary for other counseling recommendations.  Return in about 1 week (around 09/05/2019).   Aletha Halim, MD

## 2019-08-30 LAB — GC/CHLAMYDIA PROBE AMP (~~LOC~~) NOT AT ARMC
Chlamydia: NEGATIVE
Comment: NEGATIVE
Comment: NORMAL
Neisseria Gonorrhea: NEGATIVE

## 2019-09-01 ENCOUNTER — Encounter: Payer: Self-pay | Admitting: Obstetrics and Gynecology

## 2019-09-01 DIAGNOSIS — O99820 Streptococcus B carrier state complicating pregnancy: Secondary | ICD-10-CM | POA: Insufficient documentation

## 2019-09-01 LAB — CULTURE, BETA STREP (GROUP B ONLY): Strep Gp B Culture: POSITIVE — AB

## 2019-09-04 ENCOUNTER — Ambulatory Visit (INDEPENDENT_AMBULATORY_CARE_PROVIDER_SITE_OTHER): Payer: Medicaid Other | Admitting: Obstetrics and Gynecology

## 2019-09-04 ENCOUNTER — Encounter: Payer: Self-pay | Admitting: Obstetrics and Gynecology

## 2019-09-04 ENCOUNTER — Other Ambulatory Visit: Payer: Self-pay

## 2019-09-04 VITALS — BP 120/76 | HR 110 | Wt 210.6 lb

## 2019-09-04 DIAGNOSIS — Z8679 Personal history of other diseases of the circulatory system: Secondary | ICD-10-CM

## 2019-09-04 DIAGNOSIS — Z789 Other specified health status: Secondary | ICD-10-CM

## 2019-09-04 DIAGNOSIS — O43199 Other malformation of placenta, unspecified trimester: Secondary | ICD-10-CM

## 2019-09-04 DIAGNOSIS — O10919 Unspecified pre-existing hypertension complicating pregnancy, unspecified trimester: Secondary | ICD-10-CM

## 2019-09-04 DIAGNOSIS — O2343 Unspecified infection of urinary tract in pregnancy, third trimester: Secondary | ICD-10-CM

## 2019-09-04 DIAGNOSIS — O99820 Streptococcus B carrier state complicating pregnancy: Secondary | ICD-10-CM

## 2019-09-04 DIAGNOSIS — Z3A37 37 weeks gestation of pregnancy: Secondary | ICD-10-CM

## 2019-09-04 DIAGNOSIS — Z603 Acculturation difficulty: Secondary | ICD-10-CM

## 2019-09-04 DIAGNOSIS — O10913 Unspecified pre-existing hypertension complicating pregnancy, third trimester: Secondary | ICD-10-CM

## 2019-09-04 DIAGNOSIS — O0993 Supervision of high risk pregnancy, unspecified, third trimester: Secondary | ICD-10-CM

## 2019-09-04 DIAGNOSIS — O0990 Supervision of high risk pregnancy, unspecified, unspecified trimester: Secondary | ICD-10-CM

## 2019-09-04 DIAGNOSIS — O43193 Other malformation of placenta, third trimester: Secondary | ICD-10-CM

## 2019-09-04 DIAGNOSIS — B951 Streptococcus, group B, as the cause of diseases classified elsewhere: Secondary | ICD-10-CM

## 2019-09-04 DIAGNOSIS — O09523 Supervision of elderly multigravida, third trimester: Secondary | ICD-10-CM

## 2019-09-04 DIAGNOSIS — Z8759 Personal history of other complications of pregnancy, childbirth and the puerperium: Secondary | ICD-10-CM

## 2019-09-04 DIAGNOSIS — Z98891 History of uterine scar from previous surgery: Secondary | ICD-10-CM

## 2019-09-04 NOTE — Patient Instructions (Signed)
Anne Murillo  09/04/2019   Your procedure is scheduled on:  09/15/2019  Arrive at 80 at Entrance C on [address] at [facility]  and [employer]. You are invited to use the FREE valet parking or use the Visitor's parking deck.  Pick up the phone at the desk and dial [phone].  Call this number if you have problems the morning of surgery: [phone]  Remember:   Do not eat food:(After Midnight) Desps de medianoche.  Do not drink clear liquids: (After Midnight) Desps de medianoche.  Take these medicines the morning of surgery with A SIP OF WATER:  none   Do not wear jewelry, make-up or nail polish.  Do not wear lotions, powders, or perfumes. Do not wear deodorant.  Do not shave 48 hours prior to surgery.  Do not bring valuables to the hospital.  [facility] is not   responsible for any belongings or valuables brought to the hospital.  Contacts, dentures or bridgework may not be worn into surgery.  Leave suitcase in the car. After surgery it may be brought to your room.  For patients admitted to the hospital, checkout time is 11:00 AM the day of              discharge.      Please read over the following fact sheets that you were given:     Preparing for Surgery

## 2019-09-04 NOTE — Progress Notes (Signed)
   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  She is currently monitored for the following issues for this high-risk pregnancy and has GBS (group B streptococcus) UTI complicating pregnancy; Supervision of high risk pregnancy, antepartum; Chronic hypertension affecting pregnancy; History of 2 cesarean sections; Language barrier; AMA (advanced maternal age) multigravida 61+; Leiomyoma of uterus; History of postpartum pre-eclampsia; Marginal insertion of umbilical cord affecting management of mother; Obesity in pregnancy; Excessive fetal growth affecting management of mother in third trimester, antepartum; and GBS (group B Streptococcus carrier), +RV culture, currently pregnant on their problem list.  Patient reports no complaints.  Contractions: Not present. Vag. Bleeding: None.  Movement: Present. Denies leaking of fluid.   The following portions of the patient's history were reviewed and updated as appropriate: allergies, current medications, past family history, past medical history, past social history, past surgical history and problem list.   Objective:   Vitals:   09/04/19 1529  BP: 120/76  Pulse: (!) 110  Weight: 210 lb 9.6 oz (95.5 kg)    Fetal Status: Fetal Heart Rate (bpm): 153   Movement: Present     General:  Alert, oriented and cooperative. Patient is in no acute distress.  Skin: Skin is warm and dry. No rash noted.   Cardiovascular: Normal heart rate noted  Respiratory: Normal respiratory effort, no problems with respiration noted  Abdomen: Soft, gravid, appropriate for gestational age.  Pain/Pressure: Absent     Pelvic: Cervical exam deferred        Extremities: Normal range of motion.  Edema: None  Mental Status: Normal mood and affect. Normal behavior. Normal judgment and thought content.   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 Supervision of high risk pregnancy,  antepartum Reviewed options for contraception  2. History of 2 cesarean sections Scheduled for RCS @ 39 weeks, 09/15/19 Reviewed importance of going to hospital with PROM, labor, etc  3. Language barrier [place] interpretor used  4. Multigravida of advanced maternal age in third trimester  5. History of postpartum pre-eclampsia  6. Marginal insertion of umbilical cord affecting management of mother  7. GBS (group B Streptococcus carrier), +RV culture, currently pregnant  9. Chronic hypertension affecting pregnancy BP stable today Has not had BPP since 08/25/19, ordered and will get scheduled    Term labor symptoms and general obstetric precautions including but not limited to vaginal bleeding, contractions, leaking of fluid and fetal movement were reviewed in detail with the patient. Please refer to After Visit Summary for other counseling recommendations.   Return in about 1 week (around 09/11/2019) for in person, high OB.  Future Appointments  Date Time Provider [place]  09/13/2019  8:40 AM [facility] 1 [facility] None    Sloan Leiter, MD

## 2019-09-04 NOTE — Pre-Procedure Instructions (Signed)
Interpreter number [phone]

## 2019-09-05 ENCOUNTER — Encounter (HOSPITAL_COMMUNITY): Payer: Self-pay

## 2019-09-05 NOTE — Pre-Procedure Instructions (Signed)
Interpreter number [phone]

## 2019-09-11 ENCOUNTER — Other Ambulatory Visit: Payer: Self-pay

## 2019-09-11 ENCOUNTER — Ambulatory Visit: Payer: Self-pay

## 2019-09-11 ENCOUNTER — Ambulatory Visit (INDEPENDENT_AMBULATORY_CARE_PROVIDER_SITE_OTHER): Payer: Medicaid Other | Admitting: Obstetrics and Gynecology

## 2019-09-11 ENCOUNTER — Ambulatory Visit (INDEPENDENT_AMBULATORY_CARE_PROVIDER_SITE_OTHER): Payer: Medicaid Other | Admitting: *Deleted

## 2019-09-11 VITALS — BP 127/79 | HR 108 | Wt 212.2 lb

## 2019-09-11 DIAGNOSIS — O10013 Pre-existing essential hypertension complicating pregnancy, third trimester: Secondary | ICD-10-CM | POA: Diagnosis not present

## 2019-09-11 DIAGNOSIS — O2343 Unspecified infection of urinary tract in pregnancy, third trimester: Secondary | ICD-10-CM

## 2019-09-11 DIAGNOSIS — O10919 Unspecified pre-existing hypertension complicating pregnancy, unspecified trimester: Secondary | ICD-10-CM

## 2019-09-11 DIAGNOSIS — O0990 Supervision of high risk pregnancy, unspecified, unspecified trimester: Secondary | ICD-10-CM

## 2019-09-11 DIAGNOSIS — Z3A38 38 weeks gestation of pregnancy: Secondary | ICD-10-CM | POA: Diagnosis not present

## 2019-09-11 DIAGNOSIS — O09523 Supervision of elderly multigravida, third trimester: Secondary | ICD-10-CM

## 2019-09-11 DIAGNOSIS — B951 Streptococcus, group B, as the cause of diseases classified elsewhere: Secondary | ICD-10-CM

## 2019-09-11 DIAGNOSIS — Z789 Other specified health status: Secondary | ICD-10-CM

## 2019-09-11 DIAGNOSIS — O0993 Supervision of high risk pregnancy, unspecified, third trimester: Secondary | ICD-10-CM

## 2019-09-11 DIAGNOSIS — Z98891 History of uterine scar from previous surgery: Secondary | ICD-10-CM

## 2019-09-11 DIAGNOSIS — O10913 Unspecified pre-existing hypertension complicating pregnancy, third trimester: Secondary | ICD-10-CM

## 2019-09-11 NOTE — Progress Notes (Signed)
Interpreter Anne Murillo present for encounter.  Pt is scheduled for Rpt C/S on 09/15/19

## 2019-09-11 NOTE — Progress Notes (Signed)
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  She is currently monitored for the following issues for this high-risk pregnancy and has GBS (group B streptococcus) UTI complicating pregnancy; Supervision of high risk pregnancy, antepartum; Chronic hypertension affecting pregnancy; History of 2 cesarean sections; Language barrier; AMA (advanced maternal age) multigravida 72+; Leiomyoma of uterus; History of postpartum pre-eclampsia; Marginal insertion of umbilical cord affecting management of mother; Obesity in pregnancy; Excessive fetal growth affecting management of mother in third trimester, antepartum; and GBS (group B Streptococcus carrier), +RV culture, currently pregnant on their problem list.  Patient reports no complaints.   Contractions: Not present. Vag. Bleeding: None.  Movement: Present. Denies leaking of fluid.   The following portions of the patient's history were reviewed and updated as appropriate: allergies, current medications, past family history, past medical history, past social history, past surgical history and problem list. Problem list updated.  Objective:   Vitals:   09/11/19 1050  BP: 127/79  Pulse: (!) 108  Weight: 212 lb 3.2 oz (96.3 kg)    Fetal Status: Fetal Heart Rate (bpm): RNST   Movement: Present     General:  Alert, oriented and cooperative. Patient is in no acute distress.  Skin: Skin is warm and dry. No rash noted.   Cardiovascular: Normal heart rate noted  Respiratory: Normal respiratory effort, no problems with respiration noted  Abdomen: Soft, gravid, appropriate for gestational age. Pain/Pressure: Absent     Pelvic:  Cervical exam deferred        Extremities: Normal range of motion.  Edema: None  Mental Status: Normal mood and affect. Normal behavior. Normal judgment and thought content.   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 Supervision of high risk pregnancy, antepartum Routine care. No BTL  2. Chronic hypertension affecting pregnancy Doing well on no meds bpp 10/10 today Reassuring growth earlier this month  3. Multigravida of advanced maternal age in third trimester  4. History of 2 cesarean sections Already scheduled  5. Language barrier Interpreter used  Term labor symptoms and general obstetric precautions including but not limited to vaginal bleeding, contractions, leaking of fluid and fetal movement were reviewed in detail with the patient. Please refer to After Visit Summary for other counseling recommendations.  Return in about 2 weeks (around 09/25/2019) for rn incision check.   Aletha Halim, MD

## 2019-09-13 ENCOUNTER — Other Ambulatory Visit: Payer: Self-pay

## 2019-09-13 ENCOUNTER — Other Ambulatory Visit (HOSPITAL_COMMUNITY)
Admission: RE | Admit: 2019-09-13 | Discharge: 2019-09-13 | Disposition: A | Discharge status: Home / Self Care | Payer: Medicaid Other | Source: Ambulatory Visit | Attending: Obstetrics and Gynecology | Admitting: Obstetrics and Gynecology

## 2019-09-13 DIAGNOSIS — Z01812 Encounter for preprocedural laboratory examination: Secondary | ICD-10-CM | POA: Diagnosis not present

## 2019-09-13 DIAGNOSIS — Z20828 Contact with and (suspected) exposure to other viral communicable diseases: Secondary | ICD-10-CM | POA: Diagnosis not present

## 2019-09-13 LAB — CBC
HCT: 42.7 % (ref 36.0–46.0)
Hemoglobin: 14.3 g/dL (ref 12.0–15.0)
MCH: 29.3 pg (ref 26.0–34.0)
MCHC: 33.5 g/dL (ref 30.0–36.0)
MCV: 87.5 fL (ref 80.0–100.0)
Platelets: 177 10*3/uL (ref 150–400)
RBC: 4.88 MIL/uL (ref 3.87–5.11)
RDW: 13.9 % (ref 11.5–15.5)
WBC: 4.5 10*3/uL (ref 4.0–10.5)
nRBC: 0 % (ref 0.0–0.2)

## 2019-09-13 LAB — TYPE AND SCREEN
ABO/RH(D): O POS
Antibody Screen: NEGATIVE

## 2019-09-13 LAB — RPR: RPR Ser Ql: NONREACTIVE

## 2019-09-13 LAB — ABO/RH: ABO/RH(D): O POS

## 2019-09-13 LAB — SARS CORONAVIRUS 2 (TAT 6-24 HRS): SARS Coronavirus 2: NEGATIVE

## 2019-09-13 NOTE — MAU Note (Signed)
Covid swab collected.PT tolerated well. Waiting for CBC/RPR/Type and screen to be drawn

## 2019-09-14 NOTE — Anesthesia Preprocedure Evaluation (Addendum)
Anesthesia Evaluation  Patient identified by MRN, date of birth, ID band Patient awake    Reviewed: Allergy & Precautions, NPO status , Patient's Chart, lab work & pertinent test results  Airway Mallampati: III  TM Distance: >3 FB Neck ROM: Full    Dental no notable dental hx. (+) Teeth Intact, Dental Advisory Given   Pulmonary neg pulmonary ROS,    Pulmonary exam normal breath sounds clear to auscultation       Cardiovascular hypertension, Pt. on medications Normal cardiovascular exam Rhythm:Regular Rate:Normal     Neuro/Psych negative neurological ROS     GI/Hepatic negative GI ROS, Neg liver ROS,   Endo/Other  Morbid obesity  Renal/GU negative Renal ROS     Musculoskeletal   Abdominal   Peds  Hematology negative hematology ROS (+)   Anesthesia Other Findings   Reproductive/Obstetrics (+) Pregnancy Repeat c/section for previous and polyhydramnios                           Lab Results  Component Value Date   WBC 4.5 09/13/2019   HGB 14.3 09/13/2019   HCT 42.7 09/13/2019   MCV 87.5 09/13/2019   PLT 177 09/13/2019   Lab Results  Component Value Date   CREATININE 0.89 08/06/2016   BUN 7 08/06/2016   NA 135 08/06/2016   K 3.8 08/06/2016   CL 103 08/06/2016   CO2 25 08/06/2016    Anesthesia Physical  Anesthesia Plan  ASA: III  Anesthesia Plan: Spinal   Post-op Pain Management:    Induction:   PONV Risk Score and Plan:   Airway Management Planned: Natural Airway  Additional Equipment:   Intra-op Plan:   Post-operative Plan:   Informed Consent: I have reviewed the patients History and Physical, chart, labs and discussed the procedure including the risks, benefits and alternatives for the proposed anesthesia with the patient or authorized representative who has indicated his/her understanding and acceptance.       Plan Discussed with: CRNA and  Anesthesiologist  Anesthesia Plan Comments:        Anesthesia Quick Evaluation

## 2019-09-15 ENCOUNTER — Inpatient Hospital Stay (HOSPITAL_COMMUNITY): Payer: Medicaid Other | Admitting: Anesthesiology

## 2019-09-15 ENCOUNTER — Inpatient Hospital Stay (HOSPITAL_COMMUNITY)
Admission: RE | Admit: 2019-09-15 | Discharge: 2019-09-17 | DRG: 787 | Disposition: A | Discharge status: Home / Self Care | Payer: Medicaid Other | Attending: Obstetrics and Gynecology | Admitting: Obstetrics and Gynecology

## 2019-09-15 ENCOUNTER — Encounter (HOSPITAL_COMMUNITY): Payer: Self-pay | Admitting: Obstetrics and Gynecology

## 2019-09-15 ENCOUNTER — Encounter (HOSPITAL_COMMUNITY)
Admission: RE | Disposition: A | Discharge status: Home / Self Care | Payer: Self-pay | Source: Home / Self Care | Attending: Obstetrics and Gynecology

## 2019-09-15 DIAGNOSIS — D259 Leiomyoma of uterus, unspecified: Secondary | ICD-10-CM | POA: Diagnosis present

## 2019-09-15 DIAGNOSIS — Z98891 History of uterine scar from previous surgery: Secondary | ICD-10-CM

## 2019-09-15 DIAGNOSIS — O1002 Pre-existing essential hypertension complicating childbirth: Secondary | ICD-10-CM | POA: Diagnosis present

## 2019-09-15 DIAGNOSIS — O99214 Obesity complicating childbirth: Secondary | ICD-10-CM | POA: Diagnosis present

## 2019-09-15 DIAGNOSIS — Z3A39 39 weeks gestation of pregnancy: Secondary | ICD-10-CM | POA: Diagnosis not present

## 2019-09-15 DIAGNOSIS — O34211 Maternal care for low transverse scar from previous cesarean delivery: Principal | ICD-10-CM | POA: Diagnosis present

## 2019-09-15 DIAGNOSIS — Z20828 Contact with and (suspected) exposure to other viral communicable diseases: Secondary | ICD-10-CM | POA: Diagnosis present

## 2019-09-15 DIAGNOSIS — O99210 Obesity complicating pregnancy, unspecified trimester: Secondary | ICD-10-CM

## 2019-09-15 DIAGNOSIS — O10919 Unspecified pre-existing hypertension complicating pregnancy, unspecified trimester: Secondary | ICD-10-CM | POA: Diagnosis present

## 2019-09-15 DIAGNOSIS — Z30430 Encounter for insertion of intrauterine contraceptive device: Secondary | ICD-10-CM | POA: Diagnosis not present

## 2019-09-15 DIAGNOSIS — O3663X0 Maternal care for excessive fetal growth, third trimester, not applicable or unspecified: Secondary | ICD-10-CM | POA: Diagnosis present

## 2019-09-15 DIAGNOSIS — O99820 Streptococcus B carrier state complicating pregnancy: Secondary | ICD-10-CM

## 2019-09-15 DIAGNOSIS — O99824 Streptococcus B carrier state complicating childbirth: Secondary | ICD-10-CM | POA: Diagnosis present

## 2019-09-15 DIAGNOSIS — O43199 Other malformation of placenta, unspecified trimester: Secondary | ICD-10-CM | POA: Diagnosis present

## 2019-09-15 DIAGNOSIS — O43123 Velamentous insertion of umbilical cord, third trimester: Secondary | ICD-10-CM | POA: Diagnosis present

## 2019-09-15 DIAGNOSIS — Z8759 Personal history of other complications of pregnancy, childbirth and the puerperium: Secondary | ICD-10-CM

## 2019-09-15 DIAGNOSIS — Z789 Other specified health status: Secondary | ICD-10-CM | POA: Diagnosis present

## 2019-09-15 DIAGNOSIS — O2340 Unspecified infection of urinary tract in pregnancy, unspecified trimester: Secondary | ICD-10-CM | POA: Diagnosis present

## 2019-09-15 DIAGNOSIS — Z8679 Personal history of other diseases of the circulatory system: Secondary | ICD-10-CM

## 2019-09-15 DIAGNOSIS — O3413 Maternal care for benign tumor of corpus uteri, third trimester: Secondary | ICD-10-CM | POA: Diagnosis present

## 2019-09-15 DIAGNOSIS — O09529 Supervision of elderly multigravida, unspecified trimester: Secondary | ICD-10-CM

## 2019-09-15 DIAGNOSIS — B951 Streptococcus, group B, as the cause of diseases classified elsewhere: Secondary | ICD-10-CM | POA: Diagnosis present

## 2019-09-15 DIAGNOSIS — O0990 Supervision of high risk pregnancy, unspecified, unspecified trimester: Secondary | ICD-10-CM

## 2019-09-15 LAB — CBC
HCT: 42.3 % (ref 36.0–46.0)
Hemoglobin: 14.1 g/dL (ref 12.0–15.0)
MCH: 29.9 pg (ref 26.0–34.0)
MCHC: 33.3 g/dL (ref 30.0–36.0)
MCV: 89.6 fL (ref 80.0–100.0)
Platelets: 158 10*3/uL (ref 150–400)
RBC: 4.72 MIL/uL (ref 3.87–5.11)
RDW: 13.9 % (ref 11.5–15.5)
WBC: 9.1 10*3/uL (ref 4.0–10.5)
nRBC: 0 % (ref 0.0–0.2)

## 2019-09-15 LAB — CREATININE, SERUM
Creatinine, Ser: 0.5 mg/dL (ref 0.44–1.00)
GFR calc Af Amer: >60 mL/min (ref >60)
GFR calc non Af Amer: >60 mL/min (ref >60)

## 2019-09-15 SURGERY — Surgical Case
Anesthesia: Spinal

## 2019-09-15 MED ORDER — SOD CITRATE-CITRIC ACID 500-334 MG/5ML PO SOLN
ORAL | Status: AC
  Filled 2019-09-15: qty 15

## 2019-09-15 MED ORDER — SIMETHICONE 80 MG PO CHEW
80.0000 mg | CHEWABLE_TABLET | Freq: Three times a day (TID) | ORAL | Status: DC
  Administered 2019-09-16 – 2019-09-17 (×4): 80 mg via ORAL
  Filled 2019-09-15 (×6): qty 1

## 2019-09-15 MED ORDER — SENNOSIDES-DOCUSATE SODIUM 8.6-50 MG PO TABS
2.0000 | ORAL_TABLET | ORAL | Status: DC
  Administered 2019-09-16 (×2): 2 via ORAL
  Filled 2019-09-15 (×2): qty 2

## 2019-09-15 MED ORDER — PHENYLEPHRINE 40 MCG/ML (10ML) SYRINGE FOR IV PUSH (FOR BLOOD PRESSURE SUPPORT)
PREFILLED_SYRINGE | INTRAVENOUS | Status: AC
  Filled 2019-09-15: qty 10

## 2019-09-15 MED ORDER — MENTHOL 3 MG MT LOZG: 1.0000 | LOZENGE | OROMUCOSAL | Status: DC | PRN

## 2019-09-15 MED ORDER — FENTANYL CITRATE (PF) 100 MCG/2ML IJ SOLN
INTRAMUSCULAR | Status: AC
  Filled 2019-09-15: qty 2

## 2019-09-15 MED ORDER — ACETAMINOPHEN 325 MG PO TABS: 325.0000 mg | ORAL_TABLET | ORAL | Status: DC | PRN

## 2019-09-15 MED ORDER — BUPIVACAINE HCL (PF) 0.5 % IJ SOLN
INTRAMUSCULAR | Status: DC | PRN
  Administered 2019-09-15: 30 mL

## 2019-09-15 MED ORDER — WITCH HAZEL-GLYCERIN EX PADS: 1.0000 "application " | MEDICATED_PAD | CUTANEOUS | Status: DC | PRN

## 2019-09-15 MED ORDER — FENTANYL CITRATE (PF) 100 MCG/2ML IJ SOLN: 25.0000 ug | INTRAMUSCULAR | Status: DC | PRN

## 2019-09-15 MED ORDER — PHENYLEPHRINE HCL-NACL 20-0.9 MG/250ML-% IV SOLN
INTRAVENOUS | Status: DC | PRN
  Administered 2019-09-15: 60 ug/min via INTRAVENOUS

## 2019-09-15 MED ORDER — MEPERIDINE HCL 25 MG/ML IJ SOLN: 6.2500 mg | INTRAMUSCULAR | Status: DC | PRN

## 2019-09-15 MED ORDER — ACETAMINOPHEN 160 MG/5ML PO SOLN: 325.0000 mg | ORAL | Status: DC | PRN

## 2019-09-15 MED ORDER — LACTATED RINGERS IV SOLN: INTRAVENOUS | Status: DC

## 2019-09-15 MED ORDER — KETOROLAC TROMETHAMINE 30 MG/ML IJ SOLN
30.0000 mg | Freq: Four times a day (QID) | INTRAMUSCULAR | Status: DC | PRN
  Administered 2019-09-15: 17:00:00 30 mg via INTRAVENOUS
  Filled 2019-09-15: qty 1

## 2019-09-15 MED ORDER — BUPIVACAINE IN DEXTROSE 0.75-8.25 % IT SOLN
INTRATHECAL | Status: DC | PRN
  Administered 2019-09-15: 1.6 mg via INTRATHECAL

## 2019-09-15 MED ORDER — ENOXAPARIN SODIUM 60 MG/0.6ML ~~LOC~~ SOLN
50.0000 mg | SUBCUTANEOUS | Status: DC
  Administered 2019-09-16 – 2019-09-17 (×2): 50 mg via SUBCUTANEOUS
  Filled 2019-09-15 (×2): qty 0.6

## 2019-09-15 MED ORDER — BUPIVACAINE HCL (PF) 0.5 % IJ SOLN
INTRAMUSCULAR | Status: AC
  Filled 2019-09-15: qty 30

## 2019-09-15 MED ORDER — SOD CITRATE-CITRIC ACID 500-334 MG/5ML PO SOLN
30.0000 mL | ORAL | Status: AC
  Administered 2019-09-15: 30 mL via ORAL

## 2019-09-15 MED ORDER — PRENATAL MULTIVITAMIN CH
1.0000 | ORAL_TABLET | Freq: Every day | ORAL | Status: DC
  Administered 2019-09-16 – 2019-09-17 (×2): 1 via ORAL
  Filled 2019-09-15 (×2): qty 1

## 2019-09-15 MED ORDER — OXYTOCIN 40 UNITS IN NORMAL SALINE INFUSION - SIMPLE MED
INTRAVENOUS | Status: DC | PRN
  Administered 2019-09-15: 40 mL via INTRAVENOUS

## 2019-09-15 MED ORDER — COCONUT OIL OIL: 1.0000 "application " | TOPICAL_OIL | Status: DC | PRN

## 2019-09-15 MED ORDER — OXYCODONE HCL 5 MG PO TABS
5.0000 mg | ORAL_TABLET | ORAL | Status: DC | PRN
  Administered 2019-09-16: 5 mg via ORAL
  Filled 2019-09-15: qty 1

## 2019-09-15 MED ORDER — SIMETHICONE 80 MG PO CHEW
80.0000 mg | CHEWABLE_TABLET | ORAL | Status: DC
  Administered 2019-09-16 (×2): 80 mg via ORAL
  Filled 2019-09-15 (×2): qty 1

## 2019-09-15 MED ORDER — LEVONORGESTREL 19.5 MCG/DAY IU IUD
INTRAUTERINE_SYSTEM | Freq: Once | INTRAUTERINE | Status: AC
  Administered 2019-09-15: 1 via INTRAUTERINE
  Filled 2019-09-15: qty 1

## 2019-09-15 MED ORDER — OXYCODONE HCL 5 MG PO TABS: 5.0000 mg | ORAL_TABLET | Freq: Once | ORAL | Status: DC | PRN

## 2019-09-15 MED ORDER — OXYTOCIN 40 UNITS IN NORMAL SALINE INFUSION - SIMPLE MED: 2.5000 [IU]/h | INTRAVENOUS | Status: AC

## 2019-09-15 MED ORDER — SODIUM CHLORIDE 0.9 % IV SOLN: INTRAVENOUS | Status: DC | PRN

## 2019-09-15 MED ORDER — FENTANYL CITRATE (PF) 100 MCG/2ML IJ SOLN
INTRAMUSCULAR | Status: DC | PRN
  Administered 2019-09-15: 15 ug via INTRATHECAL

## 2019-09-15 MED ORDER — CEFAZOLIN SODIUM-DEXTROSE 2-4 GM/100ML-% IV SOLN
INTRAVENOUS | Status: AC
  Filled 2019-09-15: qty 100

## 2019-09-15 MED ORDER — OXYCODONE HCL 5 MG/5ML PO SOLN: 5.0000 mg | Freq: Once | ORAL | Status: DC | PRN

## 2019-09-15 MED ORDER — ONDANSETRON HCL 4 MG/2ML IJ SOLN
INTRAMUSCULAR | Status: AC
  Filled 2019-09-15: qty 2

## 2019-09-15 MED ORDER — IBUPROFEN 800 MG PO TABS
800.0000 mg | ORAL_TABLET | Freq: Three times a day (TID) | ORAL | Status: DC
  Administered 2019-09-15 – 2019-09-17 (×5): 800 mg via ORAL
  Filled 2019-09-15 (×6): qty 1

## 2019-09-15 MED ORDER — SIMETHICONE 80 MG PO CHEW
80.0000 mg | CHEWABLE_TABLET | ORAL | Status: DC | PRN
  Administered 2019-09-15: 80 mg via ORAL

## 2019-09-15 MED ORDER — TETANUS-DIPHTH-ACELL PERTUSSIS 5-2.5-18.5 LF-MCG/0.5 IM SUSP: 0.5000 mL | Freq: Once | INTRAMUSCULAR | Status: DC

## 2019-09-15 MED ORDER — SODIUM CHLORIDE 0.9 % IR SOLN
Status: DC | PRN
  Administered 2019-09-15: 1

## 2019-09-15 MED ORDER — ACETAMINOPHEN 325 MG PO TABS
650.0000 mg | ORAL_TABLET | Freq: Four times a day (QID) | ORAL | Status: DC | PRN
  Administered 2019-09-16 – 2019-09-17 (×3): 650 mg via ORAL
  Filled 2019-09-15 (×3): qty 2

## 2019-09-15 MED ORDER — CEFAZOLIN SODIUM-DEXTROSE 2-4 GM/100ML-% IV SOLN
2.0000 g | INTRAVENOUS | Status: AC
  Administered 2019-09-15: 2 g via INTRAVENOUS

## 2019-09-15 MED ORDER — DIPHENHYDRAMINE HCL 25 MG PO CAPS
25.0000 mg | ORAL_CAPSULE | Freq: Four times a day (QID) | ORAL | Status: DC | PRN
  Administered 2019-09-15: 25 mg via ORAL
  Filled 2019-09-15 (×2): qty 1

## 2019-09-15 MED ORDER — ONDANSETRON HCL 4 MG/2ML IJ SOLN
INTRAMUSCULAR | Status: DC | PRN
  Administered 2019-09-15: 4 mg via INTRAVENOUS

## 2019-09-15 MED ORDER — MORPHINE SULFATE (PF) 0.5 MG/ML IJ SOLN
INTRAMUSCULAR | Status: AC
  Filled 2019-09-15: qty 10

## 2019-09-15 MED ORDER — OXYTOCIN 10 UNIT/ML IJ SOLN
INTRAMUSCULAR | Status: AC
  Filled 2019-09-15: qty 4

## 2019-09-15 MED ORDER — ZOLPIDEM TARTRATE 5 MG PO TABS: 5.0000 mg | ORAL_TABLET | Freq: Every evening | ORAL | Status: DC | PRN

## 2019-09-15 MED ORDER — PHENYLEPHRINE 40 MCG/ML (10ML) SYRINGE FOR IV PUSH (FOR BLOOD PRESSURE SUPPORT)
PREFILLED_SYRINGE | INTRAVENOUS | Status: DC | PRN
  Administered 2019-09-15: 120 ug via INTRAVENOUS
  Administered 2019-09-15 (×2): 80 ug via INTRAVENOUS

## 2019-09-15 MED ORDER — ONDANSETRON HCL 4 MG/2ML IJ SOLN: 4.0000 mg | Freq: Once | INTRAMUSCULAR | Status: DC | PRN

## 2019-09-15 MED ORDER — MORPHINE SULFATE (PF) 0.5 MG/ML IJ SOLN
INTRAMUSCULAR | Status: DC | PRN
  Administered 2019-09-15: .15 mg via INTRATHECAL

## 2019-09-15 MED ORDER — PHENYLEPHRINE HCL-NACL 20-0.9 MG/250ML-% IV SOLN
INTRAVENOUS | Status: AC
  Filled 2019-09-15: qty 250

## 2019-09-15 MED ORDER — DIBUCAINE (PERIANAL) 1 % EX OINT: 1.0000 "application " | TOPICAL_OINTMENT | CUTANEOUS | Status: DC | PRN

## 2019-09-15 SURGICAL SUPPLY — 39 items
BENZOIN TINCTURE PRP APPL 2/3 (GAUZE/BANDAGES/DRESSINGS) ×3 IMPLANT
CHLORAPREP W/TINT 26ML (MISCELLANEOUS) ×3 IMPLANT
CLAMP CORD UMBIL (MISCELLANEOUS) IMPLANT
CLOSURE STERI-STRIP 1/2X4 (GAUZE/BANDAGES/DRESSINGS) ×1
CLOSURE WOUND 1/2 X4 (GAUZE/BANDAGES/DRESSINGS)
CLOTH BEACON ORANGE TIMEOUT ST (SAFETY) ×3 IMPLANT
CLSR STERI-STRIP ANTIMIC 1/2X4 (GAUZE/BANDAGES/DRESSINGS) ×2 IMPLANT
DRSG OPSITE POSTOP 4X10 (GAUZE/BANDAGES/DRESSINGS) ×3 IMPLANT
ELECT REM PT RETURN 9FT ADLT (ELECTROSURGICAL) ×3
ELECTRODE REM PT RTRN 9FT ADLT (ELECTROSURGICAL) ×1 IMPLANT
EXTRACTOR VACUUM BELL STYLE (SUCTIONS) IMPLANT
GLOVE BIOGEL PI IND STRL 6.5 (GLOVE) ×1 IMPLANT
GLOVE BIOGEL PI IND STRL 7.0 (GLOVE) ×2 IMPLANT
GLOVE BIOGEL PI INDICATOR 6.5 (GLOVE) ×2
GLOVE BIOGEL PI INDICATOR 7.0 (GLOVE) ×4
GLOVE ORTHOPEDIC STR SZ6.5 (GLOVE) ×3 IMPLANT
GOWN STRL REUS W/TWL LRG LVL3 (GOWN DISPOSABLE) ×9 IMPLANT
HEMOSTAT ARISTA ABSORB 3G PWDR (HEMOSTASIS) ×3 IMPLANT
KIT ABG SYR 3ML LUER SLIP (SYRINGE) IMPLANT
NEEDLE HYPO 22GX1.5 SAFETY (NEEDLE) ×3 IMPLANT
NEEDLE HYPO 25X1 1.5 SAFETY (NEEDLE) IMPLANT
NS IRRIG 1000ML POUR BTL (IV SOLUTION) ×3 IMPLANT
PACK C SECTION WH (CUSTOM PROCEDURE TRAY) ×3 IMPLANT
PAD OB MATERNITY 4.3X12.25 (PERSONAL CARE ITEMS) ×3 IMPLANT
PENCIL SMOKE EVAC W/HOLSTER (ELECTROSURGICAL) ×3 IMPLANT
STRIP CLOSURE SKIN 1/2X4 (GAUZE/BANDAGES/DRESSINGS) IMPLANT
SUT MON AB 4-0 PS1 27 (SUTURE) ×3 IMPLANT
SUT PDS AB 0 CTX 60 (SUTURE) ×3 IMPLANT
SUT PLAIN 2 0 (SUTURE) ×2
SUT PLAIN ABS 2-0 CT1 27XMFL (SUTURE) ×1 IMPLANT
SUT VIC AB 0 CT1 36 (SUTURE) ×6 IMPLANT
SUT VIC AB 0 CTX 36 (SUTURE) ×2
SUT VIC AB 0 CTX36XBRD ANBCTRL (SUTURE) ×1 IMPLANT
SUT VIC AB 2-0 CT1 27 (SUTURE) ×4
SUT VIC AB 2-0 CT1 TAPERPNT 27 (SUTURE) ×2 IMPLANT
SYR CONTROL 10ML LL (SYRINGE) ×3 IMPLANT
TOWEL OR 17X24 6PK STRL BLUE (TOWEL DISPOSABLE) ×3 IMPLANT
TRAY FOLEY W/BAG SLVR 14FR LF (SET/KITS/TRAYS/PACK) ×3 IMPLANT
WATER STERILE IRR 1000ML POUR (IV SOLUTION) ×3 IMPLANT

## 2019-09-15 NOTE — Op Note (Addendum)
Anne Murillo PROCEDURE DATE: 09/15/2019  PREOPERATIVE DIAGNOSES: Intrauterine pregnancy at [redacted]w[redacted]d weeks gestation; repeat scheduled c-section due to history of 2 prior c-sections; desires contraception with IUD placement  POSTOPERATIVE DIAGNOSES: The same  PROCEDURE: Repeat Low Transverse Cesarean Section and Liletta IUD insertion  SURGEON:  Dr. Vivien Rota - Primary Barrington Ellison, MD - Fellow  ASSISTANT:  An experienced assistant was required given the standard of surgical care given the complexity of the case.  This assistant was needed for exposure, dissection, suctioning, retraction, instrument exchange, assisting with delivery with administration of fundal pressure, and for overall help during the procedure.  ANESTHESIOLOGY TEAM: Anesthesiologist: Janeece Riggers, MD CRNA: Asher Muir, CRNA  INDICATIONS: Anne Murillo is a 34 y.o. [phone] at [redacted]w[redacted]d here for cesarean section secondary to the indications listed under preoperative diagnoses; please see preoperative note for further details.  The risks of cesarean section were discussed with the patient including but were not limited to: bleeding which may require transfusion or reoperation; infection which may require antibiotics; injury to bowel, bladder, ureters or other surrounding organs; injury to the fetus; need for additional procedures including hysterectomy in the event of a life-threatening hemorrhage; placental abnormalities wth subsequent pregnancies, incisional problems, thromboembolic phenomenon and other postoperative/anesthesia complications.   The patient concurred with the proposed plan, giving informed written consent for the procedure.    FINDINGS:  Viable female infant in cephalic presentation. Clear amniotic fluid.  Intact placenta, three vessel cord.  Normal uterus, fallopian tubes and ovaries bilaterally. Dense scar tissue throughout rectus muscles and fascia adhered to rectus.  APGAR (1 MIN): 8   APGAR (5 MINS): 9    APGAR (10 MINS):    ANESTHESIA: Spinal INTRAVENOUS FLUIDS: 2105 ml   ESTIMATED BLOOD LOSS: 461 ml URINE OUTPUT:  200 ml SPECIMENS: Placenta sent to pathology COMPLICATIONS: None immediate  PROCEDURE IN DETAIL:  The patient preoperatively received intravenous antibiotics and had sequential compression devices applied to her lower extremities.  She was then taken to the operating room where spinal anesthesia was administered and was found to be adequate. She was then placed in a dorsal supine position with a leftward tilt, and prepped and draped in a sterile manner.  A foley catheter was placed into her bladder and attached to constant gravity.  After an adequate timeout was performed, a Pfannenstiel skin incision was made with scalpel on her preexisting scarand carried through to the underlying layer of fascia. The fascia was incised in the midline, and this incision was extended bilaterally using the Mayo scissors.  Kocher clamps were applied to the inferior aspect of the fascial incision and the underlying rectus muscles were dissected off bluntly and sharply.  A similar process was carried out on the superior aspect of the fascial incision using Mayo scissors and scalpel. Due to dense rectus muscle scar tissue, hemostats and Metzenbaum scissors used to bluntly dissect rectus muscles and then enter peritoneum. The rectus muscles were then further dissected with Bovie for better visualization. The Alexis self-retaining retractor was introduced into the abdominal cavity.  Attention was turned to the lower uterine segment where a bladder flap was created and then a low transverse hysterotomy was made with a scalpel and extended bilaterally bluntly. Due to proximity to left uterine vessels, bandage scissors used to extend hysterotomy to the right. The infant was successfully delivered, the cord was clamped and cut after one minute, and the infant was handed over to the awaiting neonatology team. Uterine  massage was then administered,  and the placenta delivered intact with a three-vessel cord. The uterus was then cleared of clots and debris.  The hysterotomy was closed with 0 Vicryl in a running locked fashion, and an imbricating layer was also placed with 0 Vicryl. 2 x 3 cm left sided venous hematoma in the left broad ligament noted, just lateral to the hysterotomy, it remained soft and unchanged after several minutes of observation and was left in place. Figure-of-eight 0 Vicryl serosal stitches were placed to help with hemostasis.  The pelvis was cleared of all clot and debris. Arista applied to lower uterine segment and inferior rectus muscle tissue. Hemostasis was confirmed on all surfaces.  The retractor was removed. The fascia was then closed using 0 looped PDS in a running fashion.  The subcutaneous layer was irrigated, reapproximated with 2-0 plain gut interrupted stitches, and the skin was closed with a 4-0 Vicryl subcuticular stitch. 30 mL 0.5% Marcaine injected around incision site. The patient tolerated the procedure well. Sponge, instrument and needle counts were correct x 3.  She was taken to the recovery room in stable condition.   Barrington Ellison, MD OB Family Medicine Fellow, [facility] for [employer], [place] of Attending Supervision of [place] Fellow: Evaluation, management, and procedures were performed by the [facility] Fellow under my supervision and collaboration. I was scrubbed and present for all portions of this procedure. I agree with the documentation and plan.  [name], M.D. Attending Center for [employer] (Faculty Practice)  09/15/2019 4:21 PM

## 2019-09-15 NOTE — Transfer of Care (Signed)
Immediate Anesthesia Transfer of Care Note  Patient: Anne Murillo  Procedure(s) Performed: CESAREAN SECTION (N/A )  Patient Location: PACU  Anesthesia Type:Spinal  Level of Consciousness: awake  Airway & Oxygen Therapy: Patient Spontanous Breathing  Post-op Assessment: Report given to RN  Post vital signs: Reviewed and stable  Last Vitals:  Vitals Value Taken Time  BP 97/59 09/15/19 1321  Temp    Pulse 83 09/15/19 1325  Resp 21 09/15/19 1325  SpO2 97 % 09/15/19 1325  Vitals shown include unvalidated device data.  Last Pain:  Vitals:   09/15/19 1005  TempSrc: Oral      Patients Stated Pain Goal: 0 ([id number])  Complications: No apparent anesthesia complications

## 2019-09-15 NOTE — Lactation Note (Signed)
This note was copied from a baby's chart. Lactation Consultation Note  Patient Name: Anne Murillo Today's Date: 09/15/2019 Reason for consult: Initial assessment;Term  Dad signed the interpreter form for this couplet, mom speaks [place] and some [place].  5 hours old FT female who is being exclusively BF by her mother, she's a P3. Mom is experienced BF, she BF her first baby for 16 months and the second one for 18 months. She told LC she hasn't tried hand expression before because she doesn't feel comfortable with it, she's only do it either with the baby or the pump. LC offered to show mom hand expression but she was hesitant, she finally agreed and told LC to go ahead and try, but no colostrum was observed at this point. Mom's breast are medium/large and her tissue is very compressible.  She doesn't have a pump at home, [facility] offered one from the hospital. Instructions, cleaning and storage were reviewed as well as milk storage guidelines. Offered assistance with latch but mom politely declined, baby was asleep. Asked mom to call for assistance when needed. Reviewed normal newborn behavior, cluster feeding, feeding cues and size of baby's stomach. She also asked LC how to request formula is baby gets "hungry"; she came as breast/formula as her feeding choice on admission. [place] instructed mom to call her RN to ask for a bottle; reviewed LEAD.  Feeding plan:  1. Encouraged mom to feed baby STS 8-12 times/24 hours or sooner if feeding cues are present 2. Pumping was also encouraged since she won't do hand expression  BF brochure, BF resources and feeding diary were reviewed. Parents reported all questions and concerns were answered, they're both aware of [place] OP services and will call PRN.   Maternal Data Formula Feeding for Exclusion: Yes Reason for exclusion: Mother's choice to formula and breast feed on admission Has patient been taught Hand Expression?: Yes Does the patient have  breastfeeding experience prior to this delivery?: Yes  Feeding Feeding Type: Breast Fed  LATCH Score Latch: Repeated attempts needed to sustain latch, nipple held in mouth throughout feeding, stimulation needed to elicit sucking reflex.  Audible Swallowing: A few with stimulation  Type of Nipple: Everted at rest and after stimulation  Comfort (Breast/Nipple): Soft / non-tender  Hold (Positioning): Assistance needed to correctly position infant at breast and maintain latch.  LATCH Score: 7  Interventions Interventions: Breast feeding basics reviewed;Breast massage;Hand express;Breast compression;Hand pump  Lactation Tools Discussed/Used Tools: Pump Breast pump type: Manual WIC Program: No Pump Review: Setup, frequency, and cleaning;Milk Storage Initiated by:: MPeck Date initiated:: 09/15/19   Consult Status Consult Status: Follow-up Date: 09/16/19 Follow-up type: In-patient    [place] 09/15/2019, 6:00 PM

## 2019-09-15 NOTE — Anesthesia Postprocedure Evaluation (Signed)
Anesthesia Post Note  Patient: Anne Murillo  Procedure(s) Performed: CESAREAN SECTION (N/A )     Patient location during evaluation: PACU Anesthesia Type: Spinal Level of consciousness: oriented and awake and alert Pain management: pain level controlled Vital Signs Assessment: post-procedure vital signs reviewed and stable Respiratory status: spontaneous breathing, respiratory function stable and patient connected to nasal cannula oxygen Cardiovascular status: blood pressure returned to baseline and stable Postop Assessment: no headache, no backache and no apparent nausea or vomiting Anesthetic complications: no    Last Vitals:  Vitals:   09/15/19 1430 09/15/19 1445  BP: 102/65 (!) 117/59  Pulse: 72 75  Resp: 16 15  Temp:    SpO2: 97% 97%    Last Pain:  Vitals:   09/15/19 1408  TempSrc: Oral   Pain Goal: Patients Stated Pain Goal: 0 (09/15/19 1005)              Epidural/Spinal Function Cutaneous sensation: Able to Discern Pressure (09/15/19 1445), Patient able to flex knees: Yes (09/15/19 1445), Patient able to lift hips off bed: No (09/15/19 1445), Back pain beyond tenderness at insertion site: No (09/15/19 1445), Progressively worsening motor and/or sensory loss: No (09/15/19 1445), Bowel and/or bladder incontinence post epidural: No (09/15/19 1445)  Anne Murillo

## 2019-09-15 NOTE — H&P (Addendum)
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  Reports good fetal movement, no bleeding, no contractions, no leaking of fluid.  No acute preoperative concerns.    Cesarean Section Indication: scheduled repeat (2 prior c-sections)  Prenatal Course Source of Care: [facility] with onset of care at 12 weeks Pregnancy complications or risks: Patient Active Problem List   Diagnosis Date Noted  . GBS (group B Streptococcus carrier), +RV culture, currently pregnant 09/01/2019  . Excessive fetal growth affecting management of mother in third trimester, antepartum 08/29/2019  . Obesity in pregnancy 07/14/2019  . Marginal insertion of umbilical cord affecting management of mother 06/02/2019  . History of postpartum pre-eclampsia 04/03/2019  . Leiomyoma of uterus 03/22/2019  . Supervision of high risk pregnancy, antepartum 03/16/2019  . Chronic hypertension affecting pregnancy 03/16/2019  . History of 2 cesarean sections 03/16/2019  . Language barrier 03/16/2019  . AMA (advanced maternal age) multigravida 35+ 03/16/2019  . GBS (group B streptococcus) UTI complicating pregnancy 0000000   She plans to breastfeed, plans to bottle feed She desires IUD for postpartum contraception.   Prenatal labs and studies: ABO, Rh: --/--/O POS, O POS Performed at [facility], [place] [address] [place], [place] [address]  [phone]) Antibody: NEG (12/30 [id number]) Rubella: Immune (06/11 0000) RPR: NON REACTIVE (12/30 0921)  HBsAg: Negative (06/11 0000)  HIV: Non Reactive (10/16 0840)  [id number]-- (12/15 1231) 2 hr Glucola  WNL Genetic screening declined Anatomy US normal  Prenatal Transfer Tool  Maternal Diabetes: No Genetic Screening: Declined Maternal Ultrasounds/Referrals: Normal Fetal Ultrasounds or other Referrals:  None Maternal Substance Abuse:  No Significant Maternal Medications:   None Significant Maternal Lab Results: Group B Strep positive  Past Medical History:  Diagnosis Date  . Benign essential HTN   . Bilateral ovarian cysts   . Gestational hypertension   . Irregular heart rhythm 04/24/2013    Past Surgical History:  Procedure Laterality Date  . CESAREAN SECTION N/A 12/05/2013   Procedure: Primary Cesarean Section Delivery Baby Girl @ 0136, Apgars 9/9;  Surgeon: Osborne Oman, MD;  Location: [facility];  Service: Obstetrics;  Laterality: N/A;  . CESAREAN SECTION N/A 07/20/2016   Procedure: CESAREAN SECTION;  Surgeon: Truett Mainland, DO;  Location: [facility];  Service: Obstetrics;  Laterality: N/A;  . DILATION AND EVACUATION N/A 07/20/2015   Procedure: DILATATION AND EVACUATION;  Surgeon: Bobbye Charleston, MD;  Location: [facility];  Service: Gynecology;  Laterality: N/A;    OB History  Gravida Para Term Preterm AB Living  4 2 2   1 2   SAB TAB Ectopic Multiple Live Births  1     0 2    # Outcome Date GA Lbr Len/2nd Weight Sex Delivery Anes PTL Lv  4 Current           3 Term 07/20/16 [redacted]w[redacted]d  4085 g F CS-LTranv Spinal  LIV  2 SAB 07/21/15 [redacted]w[redacted]d         1 Term 12/05/13 [redacted]w[redacted]d  3680 g F CS-LTranv EPI  LIV    Obstetric Comments  11/2013 pLTCS for NRFHT in 1st stage due to lates. 3680gm    Social History   Socioeconomic History  . Marital status: Married    Spouse name: Not on file  . Number of children: Not on file  . Years of education: Not on file  . Highest education level: Not on file  Occupational History  . Not on file  Tobacco Use  . Smoking status: Never Smoker  . Smokeless tobacco: Never Used  Substance and Sexual Activity  . Alcohol use: No  . Drug use: No  . Sexual activity: Not Currently    Birth control/protection: None  Other Topics Concern  . Not on file  Social History Narrative  . Not on file   Social Determinants of Health   Financial Resource Strain:   . Difficulty of Paying Living Expenses: Not on file  Food  Insecurity:   . Worried About Charity fundraiser in the Last Year: Not on file  . Ran Out of Food in the Last Year: Not on file  Transportation Needs:   . Lack of Transportation (Medical): Not on file  . Lack of Transportation (Non-Medical): Not on file  Physical Activity:   . Days of Exercise per Week: Not on file  . Minutes of Exercise per Session: Not on file  Stress:   . Feeling of Stress : Not on file  Social Connections:   . Frequency of Communication with Friends and Family: Not on file  . Frequency of Social Gatherings with Friends and Family: Not on file  . Attends Religious Services: Not on file  . Active Member of Clubs or Organizations: Not on file  . Attends Archivist Meetings: Not on file  . Marital Status: Not on file    History reviewed. No pertinent family history.  Medications Prior to Admission  Medication Sig Dispense Refill Last Dose  . AMBULATORY NON FORMULARY MEDICATION 1 Device by Other route once a week. Medication Name: Blood Pressure cuff-Medium Monitored regularly at home ICD 10: z34.90 1 Device 0   . aspirin EC 81 MG tablet Take 1 tablet (81 mg total) by mouth daily. Take after 12 weeks for prevention of preeclampsia later in pregnancy 300 tablet 2   . doxylamine, Sleep, (UNISOM) 25 MG tablet Take 25 mg by mouth at bedtime as needed.     . Prenatal Vit-Fe Fumarate-FA (PREPLUS) 27-1 MG TABS Take 1 tablet by mouth daily. 30 tablet 4   . PRESCRIPTION MEDICATION Eye drops for itching- pt unsure of name     . pyridOXINE (VITAMIN B-6) 100 MG tablet Take 100 mg by mouth daily.       No Known Allergies  Review of Systems: Pertinent items noted in HPI and remainder of comprehensive ROS otherwise negative.  Physical Exam: BP 127/84   Pulse 90   Temp 98 F (36.7 C) (Oral)   Resp 16   Ht 5\' 4"  (1.626 m)   Wt 95.3 kg   LMP 12/16/2018 (Exact Date)   BMI 36.05 kg/m  FHR by Doppler: 150 bpm CONSTITUTIONAL: Well-developed, well-nourished  female in no acute distress.  HENT:  Normocephalic, atraumatic, External right and left ear normal. Oropharynx is clear and moist EYES: Conjunctivae and EOM are normal. No scleral icterus.  NECK: Normal range of motion, supple, no masses SKIN: Skin is warm and dry. No rash noted. Not diaphoretic. No erythema. No pallor. Concrete: Alert and oriented to person, place, and time. Normal reflexes, muscle tone coordination. No cranial nerve deficit noted. PSYCHIATRIC: Normal mood and affect. Normal behavior. Normal judgment and thought content. CARDIOVASCULAR: Normal heart rate noted RESPIRATORY: Effort and breath sounds normal, no problems with respiration noted ABDOMEN: Soft, nontender, nondistended, gravid. Well-healed Pfannenstiel incision. PELVIC: Deferred MUSCULOSKELETAL: Normal range of motion. No edema and no tenderness. 2+ distal pulses.  Pertinent Labs/Studies:   Results for orders placed or performed during the hospital encounter of 09/13/19 (from the past 72 hour(s))  SARS CORONAVIRUS 2 (TAT 6-24 HRS) Nasopharyngeal Nasopharyngeal Swab     Status: None   Collection Time: 09/13/19  9:14 AM   Specimen: Nasopharyngeal Swab  Result Value Ref Range   SARS Coronavirus 2 NEGATIVE NEGATIVE    Comment: (NOTE) SARS-CoV-2 target nucleic acids are NOT DETECTED. The SARS-CoV-2 RNA is generally detectable in upper and lower respiratory specimens during the acute phase of infection. Negative results do not preclude SARS-CoV-2 infection, do not rule out co-infections with other pathogens, and should not be used as the sole basis for treatment or other patient management decisions. Negative results must be combined with clinical observations, patient history, and epidemiological information. The expected result is Negative. Fact Sheet for Patients: SugarRoll.be Fact Sheet for Healthcare Providers: https://www.woods-mathews.com/ This test is not yet  approved or cleared by the [place] FDA and  has been authorized for detection and/or diagnosis of SARS-CoV-2 by FDA under an Emergency Use Authorization (EUA). This EUA will remain  in effect (meaning this test can be used) for the duration of the COVID-19 declaration under Section 56 4(b)(1) of the Act, 21 U.S.C. section 360bbb-3(b)(1), unless the authorization is terminated or revoked sooner. Performed at [facility], [place] [address] [place], [place] [address]   RPR     Status: None   Collection Time: 09/13/19  9:21 AM  Result Value Ref Range   RPR Ser Ql NON REACTIVE NON REACTIVE    Comment: Performed at [facility], [place] [address] [place], [place] [address]  Type and screen Powers     Status: None   Collection Time: 09/13/19  9:21 AM  Result Value Ref Range   ABO/RH(D) O POS    Antibody Screen NEG    Sample Expiration      09/16/2019,2359 Performed at [facility], [place] [address] [place], [place] [address]   CBC     Status: None   Collection Time: 09/13/19  9:21 AM  Result Value Ref Range   WBC 4.5 4.0 - 10.5 K/uL   RBC 4.88 3.87 - 5.11 MIL/uL   Hemoglobin 14.3 12.0 - 15.0 g/dL   HCT 42.7 36.0 - 46.0 %   MCV 87.5 80.0 - 100.0 fL   MCH 29.3 26.0 - 34.0 pg   MCHC 33.5 30.0 - 36.0 g/dL   RDW 13.9 11.5 - 15.5 %   Platelets 177 150 - 400 K/uL   nRBC 0.0 0.0 - 0.2 %    Comment: Performed at [facility], [place] [address], [place], [place] [address]  ABO/Rh     Status: None   Collection Time: 09/13/19  9:21 AM  Result Value Ref Range   ABO/RH(D)      O POS Performed at [place] [address], [place], [place] [address]     Assessment and Plan: 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 The risks of cesarean section discussed with the patient included but were not limited to: bleeding which may require transfusion or reoperation; infection which may require  antibiotics; injury to bowel, bladder, ureters or other surrounding organs; injury to the fetus; need for additional procedures including hysterectomy in the event of a life-threatening hemorrhage; placental abnormalities with subsequent pregnancies, incisional problems, thromboembolic phenomenon and other postoperative/anesthesia complications. The  patient concurred with the proposed plan, giving informed written consent for the procedure. Patient has been NPO since last night she will remain NPO for procedure. Anesthesia and OR aware. Preoperative prophylactic antibiotics and SCDs ordered on call to the OR. To OR when ready.   Pregnancy Complications: 2 prior c-sections, AMA, language barrier, LGA infant > 99% EFW, multiple fibroids as noted below, hx of post-partum Pre-E, cHTN on no medications Myomas   Site                     L(cm)      W(cm)      D(cm)      Location   Posterior                2.2        1.9        1.5   Anterior                 3.93       2.15       4.2 Contraception: Liletta IUD Circumcision:  EFW 08/25/19: 3655 gms, > 99%  Barrington Ellison, MD OB Family Medicine Fellow, [facility] for [place], [place]   She was counseled regarding the risks/benefits of IUD including insertion risk of infection, hemorrhage, damage to surrounding tissue and organs, uterine perforation. She was counseled regarding risks of IUD including implantation into uterine wall, migration outside of uterus, possible need for hysteroscopic or laparoscopic removal, ovarian cysts, expulsion. Reviewed that she is also at slightly higher risk for ectopic pregnancy and she should take a pregnancy test if she believes she may be pregnant. She was advised to use backup method of protection for one week. She verbalized understanding of all of the above and consent signed.   The risks of cesarean section were discussed with the patient; including but not limited to: infection  which may require antibiotics; bleeding which may require transfusion or re-operation; injury to bowel, bladder, ureters or other surrounding organs; need for additional procedures including hysterectomy in the event of a life-threatening hemorrhage; placental abnormalities wth subsequent pregnancies, risk of needing c-sections in future pregnancies, incisional problems, thromboembolic phenomenon and other postoperative/anesthesia complications. Answered all questions. The patient verbalized understanding of the plan, giving informed consent for the procedure. She is agreeable to blood transfusion in the event of emergency.  Patient has been NPO since 12 am, she will remain NPO for procedure Anesthesia and OR aware Preoperative prophylactic antibiotics and SCDs ordered on call to the OR  To OR when ready   [place] interpretor used for interview.  [name], M.D. Attending Center for [employer] Fish farm manager)

## 2019-09-15 NOTE — Discharge Summary (Addendum)
Postpartum Discharge Summary    Patient Name: Anne Murillo DOB: 26-Apr-1986 MRN: [id number]  Date of admission: 09/15/2019 Delivering Provider: Sloan Leiter   Date of discharge: 09/17/2019  Admitting diagnosis: History of C-section [Z98.891] Intrauterine pregnancy: [redacted]w[redacted]d    Secondary diagnosis:  Active Problems:   GBS (group B streptococcus) UTI complicating pregnancy   Chronic hypertension affecting pregnancy   History of 2 cesarean sections   Language barrier   AMA (advanced maternal age) multigravida 35+   Leiomyoma of uterus   History of postpartum pre-eclampsia   Marginal insertion of umbilical cord affecting management of mother   Excessive fetal growth affecting management of mother in third trimester, antepartum   History of C-section  Additional problems: None     Discharge diagnosis: Term Pregnancy Delivered and CHTN                                                                                                Post partum procedures:None  Augmentation: NA  Complications: None  Hospital course:  Sceduled C/S   34y.o. yo [id number] admitted to the hospital 09/15/2019 for scheduled cesarean section with the following indication:Elective Repeat. Membrane Rupture Time/Date: 12:30 PM ,09/15/2019   Patient delivered a Viable infant.09/15/2019  Details of operation can be found in separate operative note. IUD placed at time of operation. Pateint had an uncomplicated postpartum course. BP's monitored and Normal w/out meds. She is ambulating, tolerating a regular diet, passing flatus, and urinating well. Patient is discharged home in stable condition on  09/17/19   Declined interpreter day of D/C. Able to repeat back instructions and Pre-E precautions.         Delivery time: 12:31 PM    Magnesium Sulfate received: No BMZ received: No Rhophylac:No MMR:No Transfusion:No  Physical exam  Vitals:   09/16/19 0522 09/16/19 1406 09/16/19 1941 09/17/19 0503  BP:  95/61 107/64 115/69 108/79  Pulse: (!) 59 78 66 69  Resp: '18 18 18 18  ' Temp: 97.7 F (36.5 C)  98.1 F (36.7 C) 98.6 F (37 C)  TempSrc: Oral  Oral Oral  SpO2: 99% 100%    Weight:      Height:       General: alert, cooperative and no distress Lochia: appropriate Uterine Fundus: firm Incision: Healing well with no significant drainage, No significant erythema, Dressing is clean, dry, and intact DVT Evaluation: No evidence of DVT seen on physical exam. Labs: Lab Results  Component Value Date   WBC 11.4 (H) 09/16/2019   HGB 12.6 09/16/2019   HCT 38.0 09/16/2019   MCV 88.2 09/16/2019   PLT 168 09/16/2019   CMP Latest Ref Rng & Units 09/15/2019  Glucose 65 - 99 mg/dL -  BUN 6 - 20 mg/dL -  Creatinine 0.44 - 1.00 mg/dL 0.50  Sodium 135 - 145 mmol/L -  Potassium 3.5 - 5.1 mmol/L -  Chloride 101 - 111 mmol/L -  CO2 22 - 32 mmol/L -  Calcium 8.9 - 10.3 mg/dL -  Total Protein 6.5 - 8.1 g/dL -  Total Bilirubin 0.3 -  1.2 mg/dL -  Alkaline Phos 38 - 126 U/L -  AST 15 - 41 U/L -  ALT 14 - 54 U/L -    Discharge instruction: per After Visit Summary and "Baby and Me Booklet".  After visit meds:  Allergies as of 09/17/2019   No Known Allergies     Medication List    STOP taking these medications   aspirin EC 81 MG tablet     TAKE these medications   acetaminophen 500 MG tablet Commonly known as: TYLENOL Take 2 tablets (1,000 mg total) by mouth every 6 (six) hours as needed for mild pain or moderate pain (temperature > 101.5.).   AMBULATORY NON FORMULARY MEDICATION 1 Device by Other route once a week. Medication Name: Blood Pressure cuff-Medium Monitored regularly at home ICD 10: z34.90   docusate sodium 100 MG capsule Commonly known as: COLACE Take 2 capsules (200 mg total) by mouth 2 (two) times daily.   doxylamine (Sleep) 25 MG tablet Commonly known as: UNISOM Take 25 mg by mouth at bedtime as needed.   ibuprofen 800 MG tablet Commonly known as: ADVIL Take 1  tablet (800 mg total) by mouth every 8 (eight) hours.   oxyCODONE 5 MG immediate release tablet Commonly known as: Oxy IR/ROXICODONE Take 1-2 tablets (5-10 mg total) by mouth every 6 (six) hours as needed for severe pain.   PrePLUS 27-1 MG Tabs Take 1 tablet by mouth daily.   PRESCRIPTION MEDICATION Eye drops for itching- pt unsure of name   pyridOXINE 100 MG tablet Commonly known as: VITAMIN B-6 Take 100 mg by mouth daily.       Diet: routine diet  Activity: Advance as tolerated. Pelvic rest for 6 weeks.   Outpatient follow up:4 weeks Follow up Appt:No future appointments. Follow up Visit: [place] for [facility] Follow up.   Specialty: Obstetrics and Gynecology Why: 1 week for incision and blood pressure check and 4 weeks for postpartum visit Contact information: [address], [place] [id number] [place] [id number] [facility] Follow up.   Specialty: Obstetrics and Gynecology Why: as needed in pregnancy-related emergencies Contact information: [address] [id number] [place] [phone]           Please schedule this patient for Postpartum visit in: 4 weeks with the following provider: Any provider For C/S patients schedule nurse incision check in weeks 2 weeks: yes High risk pregnancy complicated by: HTN Delivery mode:  CS Anticipated Birth Control:  IUD PP Procedures needed: BP, incision check and IUD string check  Schedule Integrated BH visit: no      Newborn Data: Live born female  Birth Weight: 8lb 10oz  APGAR: 83, 9  Newborn Delivery   Birth date/time: 09/15/2019 12:31:00 Delivery type: C-Section, Low Transverse Trial of labor: No C-section categorization: Repeat      Baby Feeding: Breast Disposition:home with mother   09/17/2019 Manya Silvas, CNM

## 2019-09-16 LAB — CBC
HCT: 38 % (ref 36.0–46.0)
Hemoglobin: 12.6 g/dL (ref 12.0–15.0)
MCH: 29.2 pg (ref 26.0–34.0)
MCHC: 33.2 g/dL (ref 30.0–36.0)
MCV: 88.2 fL (ref 80.0–100.0)
Platelets: 168 10*3/uL (ref 150–400)
RBC: 4.31 MIL/uL (ref 3.87–5.11)
RDW: 13.8 % (ref 11.5–15.5)
WBC: 11.4 10*3/uL — ABNORMAL HIGH (ref 4.0–10.5)
nRBC: 0 % (ref 0.0–0.2)

## 2019-09-16 MED ORDER — NALBUPHINE HCL 10 MG/ML IJ SOLN
5.0000 mg | INTRAMUSCULAR | Status: DC | PRN
  Administered 2019-09-16: 03:00:00 5 mg via INTRAVENOUS
  Filled 2019-09-16: qty 1

## 2019-09-16 MED ORDER — DOCUSATE SODIUM 100 MG PO CAPS
200.0000 mg | ORAL_CAPSULE | Freq: Two times a day (BID) | ORAL | Status: DC
  Administered 2019-09-16 – 2019-09-17 (×3): 200 mg via ORAL
  Filled 2019-09-16 (×4): qty 2

## 2019-09-16 NOTE — Progress Notes (Signed)
LTCS dressing changed with aseptic technique. Pt tolerated well.

## 2019-09-16 NOTE — Progress Notes (Signed)
Patient ID: Anne Murillo, female   DOB: [birth year], 34 y.o.   MRN: [id number]   POSTPARTUM PROGRESS NOTE  Post Partum Day 1  Subjective:  Anne Murillo is a 34 y.o. [id number] s/p RCS at [redacted]w[redacted]d.  No acute events overnight.  Pt denies problems with ambulating, voiding or po intake.  She denies nausea or vomiting.  Pain is well controlled.  She has not had flatus. She has not had bowel movement.  Lochia Minimal.   Objective: Blood pressure 95/61, pulse (!) 59, temperature 97.7 F (36.5 C), temperature source Oral, resp. rate 18, height 5\' 4"  (1.626 m), weight 95.3 kg, last menstrual period 12/16/2018, SpO2 99 %, unknown if currently breastfeeding.  Physical Exam:  General: alert, cooperative and no distress Chest: no respiratory distress Heart:regular rate, distal pulses intact Abdomen: soft, nontender,  Uterine Fundus: firm, appropriately tender DVT Evaluation: No calf swelling or tenderness Extremities: none edema Skin: warm, dry. Dressing clean/dry/intact  Recent Labs    09/15/19 1631 09/16/19 0509  HGB 14.1 12.6  HCT 42.3 38.0    Assessment/Plan: Anne Murillo is a 34 y.o. [id number] s/p RCS at [redacted]w[redacted]d   PPD# 1 - Doing well Contraception: IUD- placed  Feeding: Breast  Dispo: Plan for discharge Tomorrow. Encouraged her to move around more today. #Colace BID    LOS: 1 day   Lezlie Lye, NP 09/16/2019, 9:33 AM

## 2019-09-16 NOTE — Lactation Note (Signed)
This note was copied from a baby's chart. Lactation Consultation Note: Mother speaks [place] and some [place]. Husband at the bedside to interpret .  P3, infant is 69 hours old and mother has been exclusively breastfeeding with exception of two bottles with 5-7 ml of formula.   Mother reports that breastfeeding is going well. She reports strong tugging and no pain with latch.  Encouraged to continue to hand express and do good breast massage. Reviewed hand expression and massage with mother. No observed colostrum.  Mother has a hand pump at the bedside. Mother reports that she has used the pump before with other children.  Mother to continue to cue base feed. Discussed cluster feeding. Advised mother to continue to breastfeed infant 8-12 or more times in 24 hours.    Patient Name: Anne Murillo Today's Date: 09/16/2019 Reason for consult: Follow-up assessment   Maternal Data Has patient been taught Hand Expression?: (reviewed hand expression)  Feeding Feeding Type: Breast Fed  LATCH Score                   Interventions Interventions: Hand express;Hand pump  Lactation Tools Discussed/Used     Consult Status Consult Status: Follow-up Date: 09/17/19 Follow-up type: In-patient    Jess Barters [facility] 09/16/2019, 2:43 PM

## 2019-09-17 MED ORDER — OXYCODONE HCL 5 MG PO TABS: 5.0000 mg | ORAL_TABLET | Freq: Four times a day (QID) | ORAL | 0 refills | Status: DC | PRN

## 2019-09-17 MED ORDER — DOCUSATE SODIUM 100 MG PO CAPS: 200.0000 mg | ORAL_CAPSULE | Freq: Two times a day (BID) | ORAL | 1 refills | Status: DC

## 2019-09-17 MED ORDER — IBUPROFEN 800 MG PO TABS: 800.0000 mg | ORAL_TABLET | Freq: Three times a day (TID) | ORAL | 0 refills | Status: DC

## 2019-09-17 MED ORDER — ACETAMINOPHEN 500 MG PO TABS: 1000.0000 mg | ORAL_TABLET | Freq: Four times a day (QID) | ORAL | 1 refills | Status: DC | PRN

## 2019-09-17 NOTE — Lactation Note (Signed)
This note was copied from a baby's chart. Lactation Consultation Note  Patient Name: Girl Clova Muro Today's Date: 09/17/2019 Reason for consult: Follow-up assessment;Other (Comment);Term;Infant weight loss(mom speaks limited Engish and dad sigbed consent to translate) [place] is mom language preference.  Per mom the baby last fed at 1145 for 15 mins , wet and stool.  Denies soreness and feels comfortable with latch , and is hearing  Increased swallows. Comfortable with releasing baby with comfort from the breast.  LC reviewed sore nipple and engorgement prevention and tx. Mom has A hand pump and LC provided a #27 F is needed for when the milk comes in.  [facility] stressed the importance of STS feedings until the baby is back to birth weight, gaining steadily and can stay awake for a feeding.  Discussed importance of watching the baby for hanging out latched.  Mom has the [facility] pamphlet with phone numbers.    Maternal Data    Feeding Feeding Type: (per mom baby last fed at 1145 for 15 mins)  LATCH Score                   Interventions Interventions: Breast feeding basics reviewed;Hand pump  Lactation Tools Discussed/Used Tools: Pump;Flanges Flange Size: 24;27 Breast pump type: Manual Pump Review: Milk Storage   Consult Status Consult Status: Complete Date: 09/17/19    Myer Haff 09/17/2019, 12:33 PM

## 2019-09-17 NOTE — Plan of Care (Signed)
  Problem: Education: Goal: Knowledge of General Education information will improve Description: Including pain rating scale, medication(s)/side effects and non-pharmacologic comfort measures Outcome: Completed/Met   Problem: Clinical Measurements: Goal: Ability to maintain clinical measurements within normal limits will improve Outcome: Completed/Met Goal: Will remain free from infection Outcome: Completed/Met Goal: Diagnostic test results will improve Outcome: Completed/Met Goal: Respiratory complications will improve Outcome: Completed/Met Goal: Cardiovascular complication will be avoided Outcome: Completed/Met   Problem: Activity: Goal: Risk for activity intolerance will decrease Outcome: Completed/Met   Problem: Nutrition: Goal: Adequate nutrition will be maintained Outcome: Completed/Met   Problem: Elimination: Goal: Will not experience complications related to urinary retention Outcome: Completed/Met   Problem: Pain Managment: Goal: General experience of comfort will improve Outcome: Completed/Met   Problem: Safety: Goal: Ability to remain free from injury will improve Outcome: Completed/Met   Problem: Skin Integrity: Goal: Risk for impaired skin integrity will decrease Outcome: Completed/Met   Problem: Education: Goal: Knowledge of condition will improve Outcome: Completed/Met   Problem: Activity: Goal: Will verbalize the importance of balancing activity with adequate rest periods Outcome: Completed/Met Goal: Ability to tolerate increased activity will improve Outcome: Completed/Met   Problem: Life Cycle: Goal: Chance of risk for complications during the postpartum period will decrease Outcome: Completed/Met   Problem: Role Relationship: Goal: Ability to demonstrate positive interaction with newborn will improve Outcome: Completed/Met   Problem: Skin Integrity: Goal: Demonstration of wound healing without infection will improve Outcome:  Completed/Met

## 2019-09-17 NOTE — Plan of Care (Signed)
  Problem: Clinical Measurements: Goal: Ability to maintain clinical measurements within normal limits will improve Outcome: Completed/Met Goal: Will remain free from infection Outcome: Completed/Met Goal: Diagnostic test results will improve Outcome: Completed/Met Goal: Respiratory complications will improve Outcome: Completed/Met Goal: Cardiovascular complication will be avoided Outcome: Completed/Met   Problem: Activity: Goal: Risk for activity intolerance will decrease Outcome: Completed/Met   Problem: Nutrition: Goal: Adequate nutrition will be maintained Outcome: Completed/Met   Problem: Pain Managment: Goal: General experience of comfort will improve Outcome: Completed/Met   Problem: Safety: Goal: Ability to remain free from injury will improve Outcome: Completed/Met   Problem: Skin Integrity: Goal: Risk for impaired skin integrity will decrease Outcome: Completed/Met   Problem: Education: Goal: Knowledge of condition will improve Outcome: Completed/Met   Problem: Activity: Goal: Will verbalize the importance of balancing activity with adequate rest periods Outcome: Completed/Met Goal: Ability to tolerate increased activity will improve Outcome: Completed/Met   Problem: Life Cycle: Goal: Chance of risk for complications during the postpartum period will decrease Outcome: Completed/Met   Problem: Role Relationship: Goal: Ability to demonstrate positive interaction with newborn will improve Outcome: Completed/Met   Problem: Skin Integrity: Goal: Demonstration of wound healing without infection will improve Outcome: Completed/Met

## 2019-09-17 NOTE — Discharge Instructions (Signed)
Hypertension pendant la grossesse  La pression artrielle est la force exerce sur les parois de vos vaisseaux sanguins lorsque le sang Corporate treasurer. La pression artrielle facilite la circulation du sang [employer].  Mesure de la pression artrielle La pression artrielle est rgulirement contrle en plaant une bande large appele brassard autour de la partie suprieure de votre bras. L'air est pomp dans le brassard. Votre pression artrielle est mesure lorsque l'air sort du brassard. La pression artrielle est le rapport [employer] sur un second [place]. Vous pouvez entendre votre mdecin dire 110 sur 72 (110/72), par exemple.   Le nombre du dessus est le plus lev et constitue la mesure systolique. C'est la pression exerce dans les vaisseaux sanguins lorsque le cour se contracte.   Le nombre du dessous est le moins lev et constitue la mesure diastolique. C'est la pression exerce dans les vaisseaux sanguins lorsque le cour est au repos entre les battements.  Pression artrielle normale Dans le cas d'une pression artrielle normale, le chiffre du haut doit tre infrieur 120 (systolique) et le chiffre du bas, infrieur  80 (diastolique). La pression artrielle varie d'un individu  l'autre. La pression artrielle de chaque individu varie d'heure en heure et de jour en Jour.  Pression artrielle leve Une pression artrielle leve est galement appele hypertension. Une pression artrielle leve est de 130 ou plus sur 80 ou plus. L'hypertension est constitue de diffrents stades, en fonction des [employer]. Un diagnostic de pression artrielle leve ne peut tre tabli sans que votre pression artrielle soit contrle plusieurs fois et que celle-ci reste leve.  Hypertension pendant la grossesse L'hypertension peut tre dangereuse pour les femmes enceintes et l'enfant qu'elles portent. Les femmes prsentant  une hypertension avant la grossesse peuvent avoir plus de problmes pendant la [place]. Certaines femmes ont une pression artrielle leve lorsqu'elles sont enceintes. Une hypertension pendant le second cycle de grossesse est appele hypertension gestationnelle. Sans traitement, l'hypertension pendant la grossesse peut gnrer un petit bb ou un bb malade ainsi que des problmes pour la mre.  Signes d'hypertension pendant la grossesse La seule manire de savoir si vous avez de l'hypertension est de Corporate treasurer. La plupart des individus ne prsentent [place].  Prcautions  prendre Si vous faites de l'hypertension : .  Contrlez rgulirement votre pression artrielle. Urbano Heir rgulirement chez votre mdecin pour effectuer des [employer] vousmme et votre bb. Rexene Edison votre traitement contre l'hypertension en suivant les Tax adviser. Prenez votre traitement mme si vous vous sentez bien. Vanetta Shawl l'apport en sel. Carrie Mew de l'exercice presque tous les jours. .  Reposez-vous normment. Restez [employer] ct gauche pour donner  votre bb le plus d'oxygne possible. Demandez de l'aide si vous travaillez ou si vous avez d'autres [employer]. .  Rduire le stress.  Hypertension gestationnelle Votre mdecin contrlera votre tat de sant pendant la grossesse afin de dpister une hypertension gestationnelle. L'hypertension gestationnelle peut voluer en prclampsie ou en clampsie. Human resources officer placenta, les reins, Chartered certified accountant de la [place].  Signes de prclampsie .  Maux de tte .  Brlures d'estomac .  Vision trouble ou apparition de tches [employer] de vision .  Difficults  respirer ou essoufflement .  Douleurs dans la partie suprieure droite de l'estomac Si vous faites de l'hypertension et que vous avez des crises, on appelle cela de l'clampsie. L'hypertension gestationnelle  peut se traiter par [employer]  diminution de votre pression artrielle en restant au lit ou en utilisant des [employer], un sjour [employer], ou le dclenchement de Designer, multimedia. Informez immdiatement votre mdecin si vous prsentez un de ces signes ou si vous avez des questions ou des inquitudes.   Cesarean Delivery, Care After This sheet gives you information about how to care for yourself after your procedure. Your health care provider may also give you more specific instructions. If you have problems or questions, contact your health care provider. What can I expect after the procedure? After the procedure, it is common to have:  A small amount of blood or clear fluid coming from the incision.  Some redness, swelling, and pain in your incision area.  Some abdominal pain and soreness.  Vaginal bleeding (lochia). Even though you did not have a vaginal delivery, you will still have vaginal bleeding and discharge.  Pelvic cramps.  Fatigue. You may have pain, swelling, and discomfort in the tissue between your vagina and your anus (perineum) if:  Your C-section was unplanned, and you were allowed to labor and push.  An incision was made in the area (episiotomy) or the tissue tore during attempted vaginal delivery. Follow these instructions at home: Incision care   Follow instructions from your health care provider about how to take care of your incision. Make sure you: ? Wash your hands with soap and water before you change your bandage (dressing). If soap and water are not available, use hand sanitizer. ? If you have a dressing, change it or remove it as told by your health care provider. ? Leave stitches (sutures), skin staples, skin glue, or adhesive strips in place. These skin closures may need to stay in place for 2 weeks or longer. If adhesive strip edges start to loosen and curl up, you may trim the loose edges. Do not remove adhesive strips completely unless your  health care provider tells you to do that.  Check your incision area every day for signs of infection. Check for: ? More redness, swelling, or pain. ? More fluid or blood. ? Warmth. ? Pus or a bad smell.  Do not take baths, swim, or use a hot tub until your health care provider says it's okay. Ask your health care provider if you can take showers.  When you cough or sneeze, hug a pillow. This helps with pain and decreases the chance of your incision opening up (dehiscing). Do this until your incision heals. Medicines  Take over-the-counter and prescription medicines only as told by your health care provider.  If you were prescribed an antibiotic medicine, take it as told by your health care provider. Do not stop taking the antibiotic even if you start to feel better.  Do not drive or use heavy machinery while taking prescription pain medicine. Lifestyle  Do not drink alcohol. This is especially important if you are breastfeeding or taking pain medicine.  Do not use any products that contain nicotine or tobacco, such as cigarettes, e-cigarettes, and chewing tobacco. If you need help quitting, ask your health care provider. Eating and drinking  Drink at least 8 eight-ounce glasses of water every day unless told not to by your health care provider. If you breastfeed, you may need to drink even more water.  Eat high-fiber foods every day. These foods may help prevent or relieve constipation. High-fiber foods include: ? Whole grain cereals and breads. ? Brown rice. ? Beans. ? Fresh fruits and vegetables. Activity   If possible, have someone  help you care for your baby and help with household activities for at least a few days after you leave the hospital.  Return to your normal activities as told by your health care provider. Ask your health care provider what activities are safe for you.  Rest as much as possible. Try to rest or take a nap while your baby is sleeping.  Do not  lift anything that is heavier than 10 lbs (4.5 kg), or the limit that you were told, until your health care provider says that it is safe.  Talk with your health care provider about when you can engage in sexual activity. This may depend on your: ? Risk of infection. ? How fast you heal. ? Comfort and desire to engage in sexual activity. General instructions  Do not use tampons or douches until your health care provider approves.  Wear loose, comfortable clothing and a supportive and well-fitting bra.  Keep your perineum clean and dry. Wipe from front to back when you use the toilet.  If you pass a blood clot, save it and call your health care provider to discuss. Do not flush blood clots down the toilet before you get instructions from your health care provider.  Keep all follow-up visits for you and your baby as told by your health care provider. This is important. Contact a health care provider if:  You have: ? A fever. ? Bad-smelling vaginal discharge. ? Pus or a bad smell coming from your incision. ? Difficulty or pain when urinating. ? A sudden increase or decrease in the frequency of your bowel movements. ? More redness, swelling, or pain around your incision. ? More fluid or blood coming from your incision. ? A rash. ? Nausea. ? Little or no interest in activities you used to enjoy. ? Questions about caring for yourself or your baby.  Your incision feels warm to the touch.  Your breasts turn red or become painful or hard.  You feel unusually sad or worried.  You vomit.  You pass a blood clot from your vagina.  You urinate more than usual.  You are dizzy or light-headed. Get help right away if:  You have: ? Pain that does not go away or get better with medicine. ? Chest pain. ? Difficulty breathing. ? Blurred vision or spots in your vision. ? Thoughts about hurting yourself or your baby. ? New pain in your abdomen or in one of your legs. ? A severe  headache.  You faint.  You bleed from your vagina so much that you fill more than one sanitary pad in one hour. Bleeding should not be heavier than your heaviest period. Summary  After the procedure, it is common to have pain at your incision site, abdominal cramping, and slight bleeding from your vagina.  Check your incision area every day for signs of infection.  Tell your health care provider about any unusual symptoms.  Keep all follow-up visits for you and your baby as told by your health care provider. This information is not intended to replace advice given to you by your health care provider. Make sure you discuss any questions you have with your health care provider. Document Revised: 03/09/2018 Document Reviewed: 03/09/2018 Elsevier Patient Education  [place].

## 2019-09-19 LAB — SURGICAL PATHOLOGY

## 2019-09-28 ENCOUNTER — Other Ambulatory Visit: Payer: Self-pay

## 2019-09-28 ENCOUNTER — Ambulatory Visit (INDEPENDENT_AMBULATORY_CARE_PROVIDER_SITE_OTHER): Payer: Medicaid Other

## 2019-09-28 VITALS — BP 138/73 | HR 49 | Wt 200.0 lb

## 2019-09-28 DIAGNOSIS — Z5189 Encounter for other specified aftercare: Secondary | ICD-10-CM

## 2019-09-28 NOTE — Progress Notes (Signed)
Pt here today for incision and BP check s/p repeat c-section on 09/15/19. Pt endorses some abdominal pain; states she is taking ibuprofen with improvement. Reports constipation, not having BM every day. Encouraged pt to continue Colace as prescribed and to try otc Miralax to have a BM every day.   BP today is 138/73; pt has chronic htn and is not taking any BP meds. Incision has multiple small, open areas with tan drainage. Surrounding area appears reddened. Encouraged pt to continue good wound care daily and reviewed s/s of infection. Roselie Awkward, MD to bedside to assess incision, who states the incision appears to be healing well, with well approximated edges. Roselie Awkward, MD also reviewed pt's BP at this time and states pt may follow up at [facility] visit.  Pt has no further concerns at this time. PP appts reviewed.   Apolonio Schneiders RN 09/28/19

## 2019-09-30 ENCOUNTER — Ambulatory Visit: Payer: Self-pay

## 2019-09-30 NOTE — Lactation Note (Signed)
This note was copied from a baby's chart. Lactation Consultation Note  Patient Name: Anne Murillo [id number] Date: 09/30/2019 Reason for consult: Follow-up assessment;Other (Comment);MD order(pedis Consult per MD request prior to D/C - due to slow weight gain  / 76 weeks old)  This LC was informed by another LC she received a call for request to see this dyad prior to D/C today.  [place] interpreter Lelan Pons [phone] on the line - I pad - French  LC called the Pedis unit and mentioned she would be available at 1340.  As LC entered the room, mom packed and waiting on her ride and baby all dressed.  Per mom baby feeds every 1 -3 hours and supplements 3-4 oz per feeding.  Mom showed the amount of milk she has been pumping off and mentioned its been 8 oz of pumping. Mom reported she is not using the Nipple Shield given to her in the clinic.  LC unable to see a feeding due the baby already had fed.  The Pedis RN - Alfonse Flavors. Reported she observed her latch and the baby was properly latched and fed well. Mom supplemented with EBM afterwards - 45 ml .  Mom has been using the DEBP [facility] in the hospital and pumping off 8 oz.  Mom has great milk supply.  D/C order for feedings is to breast feed 1st and supplement with EBM 1-2 oz .  Mom has a hand pump at home ( #24 F and #27 F ) and the DEBP she has been using in the hospital.  LC discussed nutritive vs non - nutritive feeding patterns and the importance of watching the baby for hanging out latched. During feedings to do intermittent compressions to make the baby more efficient at the latch so she would gain well.  Pedis RN called [place] back to see about a DEBP for home and since mom is not active with WIC the best LC could do is to send a referral to [place] for DEBP.  In [place] mom mentioned she has a 10 am appt Monday for the baby.  LC recommended to ask to see the lactation consultant Van Clines for F/U .  Dad speaks English and LC  recommended for her to have him call if [facility] questions.    Maternal Data    Feeding Feeding Type: (per Pedis RN baby recently fed 15 mins with a good latch for 6mins/ supplemented 45 ml EBM - see LC note) Nipple Type: Regular  LATCH Score                   Interventions Interventions: Breast feeding basics reviewed  Lactation Tools Discussed/Used     Consult Status Consult Status: Follow-up(LC recommended to F/U with Van Clines [facility] in the [place] referral sent Saturday for a DEBP if needed) Date: (per mom appt Monday at 1000 at the [facility]) Follow-up type: [place] 09/30/2019, 2:25 PM

## 2019-10-13 NOTE — Progress Notes (Signed)
Patient seen and assessed by nursing staff during this encounter. I have reviewed the chart and agree with the documentation and plan.  [name], MD 10/13/2019 2:52 PM  Patient ID: Anne Murillo, female   DOB: 06/27/1986, 34 y.o.   MRN: [id number]

## 2019-10-23 ENCOUNTER — Other Ambulatory Visit: Payer: Self-pay

## 2019-10-23 ENCOUNTER — Ambulatory Visit (INDEPENDENT_AMBULATORY_CARE_PROVIDER_SITE_OTHER): Payer: Medicaid Other | Admitting: Nurse Practitioner

## 2019-10-23 ENCOUNTER — Encounter: Payer: Self-pay | Admitting: Nurse Practitioner

## 2019-10-23 DIAGNOSIS — Z6835 Body mass index (BMI) 35.0-35.9, adult: Secondary | ICD-10-CM | POA: Insufficient documentation

## 2019-10-23 DIAGNOSIS — T8332XA Displacement of intrauterine contraceptive device, initial encounter: Secondary | ICD-10-CM

## 2019-10-23 DIAGNOSIS — R102 Pelvic and perineal pain: Secondary | ICD-10-CM

## 2019-10-23 NOTE — Progress Notes (Signed)
Subjective:     Anne Murillo is a 34 y.o. female who presents for a postpartum visit. She is 5 weeks postpartum following a low cervical transverse Cesarean section. I have fully reviewed the prenatal and intrapartum course. The delivery was at 39/0 gestational weeks. Outcome: repeat cesarean section, low transverse incision. Anesthesia: spinal. Postpartum course has been good. Baby's course has been good. Baby is feeding by breast. Bleeding thin lochia. Bowel function is abnormal: constipation. Bladder function is normal. Patient is not sexually active. Contraception method is IUD. Postpartum depression screening: negative.  The following portions of the patient's history were reviewed and updated as appropriate: allergies, current medications, past family history, past medical history, past social history, past surgical history and problem list.  Review of Systems Pertinent items noted in HPI and remainder of comprehensive ROS otherwise negative.   Objective:    LMP 12/16/2018 (Exact Date)   General:  alert, cooperative and no distress   Breasts:  not examined - breastfeeding  Lungs: clear to auscultation bilaterally  Heart:  regular rate and rhythm, S1, S2 normal, no murmur, click, rub or gallop  Abdomen: soft, pain in low midline   Vulva:  normal  Vagina and cervix: No IUD strings seen externally, unable to determine if IUD strings are in the cervical canal   Bimanual deferred        Rectal Exam: Not performed.        Assessment:    Pelvic pain at postpartum exam. Pap smear not done at today's visit.  BP is normal IUD strings not seen on exam and client is having worse abdominal pain after this surgery.  Will get [place] to visualize location of IUD.  Plan:    1. Contraception: IUD  Strings not seen on speculum exam and client has increased pain with this C/S over other C/Ss she has had.  Will get [place] to visualize IUD in the correct location. 2. In person interpreter present for  the entire visit. 4. Weight loss advised as BMI is elevated.  Advised to lose below 135 pounds which will also help avoid hypertension and diabetes in the future.  BP is normal today. 3. Follow up in: 1 year or as needed.

## 2019-10-23 NOTE — Patient Instructions (Addendum)
Constipation, Adult Constipation is when a person:  Poops (has a bowel movement) fewer times in a week than normal.  Has a hard time pooping.  Has poop that is dry, hard, or bigger than normal. Follow these instructions at home: Eating and drinking   Eat foods that have a lot of fiber, such as: ? Fresh fruits and vegetables. ? Whole grains. ? Beans.  Eat less of foods that are high in fat, low in fiber, or overly processed, such as: ? [place] fries. ? Hamburgers. ? Cookies. ? Candy. ? Soda.  Drink enough fluid to keep your pee (urine) clear or pale yellow. General instructions  Exercise regularly or as told by your doctor.  Go to the restroom when you feel like you need to poop. Do not hold it in.  Take over-the-counter and prescription medicines only as told by your doctor. These include any fiber supplements.  Do pelvic floor retraining exercises, such as: ? Doing deep breathing while relaxing your lower belly (abdomen). ? Relaxing your pelvic floor while pooping.  Watch your condition for any changes.  Keep all follow-up visits as told by your doctor. This is important. Contact a doctor if:  You have pain that gets worse.  You have a fever.  You have not pooped for 4 days.  You throw up (vomit).  You are not hungry.  You lose weight.  You are bleeding from the anus.  You have thin, pencil-like poop (stool). Get help right away if:  You have a fever, and your symptoms suddenly get worse.  You leak poop or have blood in your poop.  Your belly feels hard or bigger than normal (is bloated).  You have very bad belly pain.  You feel dizzy or you faint. This information is not intended to replace advice given to you by your health care provider. Make sure you discuss any questions you have with your health care provider. Document Revised: 08/13/2017 Document Reviewed: 02/19/2016 Elsevier Patient Education  2020 [place] Following a healthy eating pattern may help you to achieve and maintain a healthy body weight, reduce the risk of chronic disease, and live a long and productive life. It is important to follow a healthy eating pattern at an appropriate calorie level for your body. Your nutritional needs should be met primarily through food by choosing a variety of nutrient-rich foods. What are tips for following this plan? Reading food labels  Read labels and choose the following: ? Reduced or low sodium. ? Juices with 100% fruit juice. ? Foods with low saturated fats and high polyunsaturated and monounsaturated fats. ? Foods with whole grains, such as whole wheat, cracked wheat, brown rice, and wild rice. ? Whole grains that are fortified with folic acid. This is recommended for women who are pregnant or who want to become pregnant.  Read labels and avoid the following: ? Foods with a lot of added sugars. These include foods that contain brown sugar, corn sweetener, corn syrup, dextrose, fructose, glucose, high-fructose corn syrup, honey, invert sugar, lactose, malt syrup, maltose, molasses, raw sugar, sucrose, trehalose, or turbinado sugar.  Do not eat more than the following amounts of added sugar per day:  6 teaspoons (25 g) for women.  9 teaspoons (38 g) for men. ? Foods that contain processed or refined starches and grains. ? Refined grain products, such as white flour, degermed cornmeal, white bread, and white rice. Shopping  Choose nutrient-rich snacks, such as vegetables, whole fruits,  and nuts. Avoid high-calorie and high-sugar snacks, such as potato chips, fruit snacks, and candy.  Use oil-based dressings and spreads on foods instead of solid fats such as butter, stick margarine, or cream cheese.  Limit pre-made sauces, mixes, and "instant" products such as flavored rice, instant noodles, and ready-made pasta.  Try more plant-protein sources, such as tofu, tempeh, black beans,  edamame, lentils, nuts, and seeds.  Explore eating plans such as the Mediterranean diet or vegetarian diet. Cooking  Use oil to saut or stir-fry foods instead of solid fats such as butter, stick margarine, or lard.  Try baking, boiling, grilling, or broiling instead of frying.  Remove the fatty part of meats before cooking.  Steam vegetables in water or broth. Meal planning   At meals, imagine dividing your plate into fourths: ? One-half of your plate is fruits and vegetables. ? One-fourth of your plate is whole grains. ? One-fourth of your plate is protein, especially lean meats, poultry, eggs, tofu, beans, or nuts.  Include low-fat dairy as part of your daily diet. Lifestyle  Choose healthy options in all settings, including home, work, school, restaurants, or stores.  Prepare your food safely: ? Wash your hands after handling raw meats. ? Keep food preparation surfaces clean by regularly washing with hot, soapy water. ? Keep raw meats separate from ready-to-eat foods, such as fruits and vegetables. ? Cook seafood, meat, poultry, and eggs to the recommended internal temperature. ? Store foods at safe temperatures. In general:  Keep cold foods at 32F (4.4C) or below.  Keep hot foods at 132F (60C) or above.  Keep your freezer at [facility] (-17.8C) or below.  Foods are no longer safe to eat when they have been between the temperatures of 40-132F (4.4-60C) for more than 2 hours. What foods should I eat? Fruits Aim to eat 2 cup-equivalents of fresh, canned (in natural juice), or frozen fruits each day. Examples of 1 cup-equivalent of fruit include 1 small apple, 8 large strawberries, 1 cup canned fruit,  cup dried fruit, or 1 cup 100% juice. Vegetables Aim to eat 2-3 cup-equivalents of fresh and frozen vegetables each day, including different varieties and colors. Examples of 1 cup-equivalent of vegetables include 2 medium carrots, 2 cups raw, leafy greens, 1 cup chopped  vegetable (raw or cooked), or 1 medium baked potato. Grains Aim to eat 6 ounce-equivalents of whole grains each day. Examples of 1 ounce-equivalent of grains include 1 slice of bread, 1 cup ready-to-eat cereal, 3 cups popcorn, or  cup cooked rice, pasta, or cereal. Meats and other proteins Aim to eat 5-6 ounce-equivalents of protein each day. Examples of 1 ounce-equivalent of protein include 1 egg, 1/2 cup nuts or seeds, or 1 tablespoon (16 g) peanut butter. A cut of meat or fish that is the size of a deck of cards is about 3-4 ounce-equivalents.  Of the protein you eat each week, try to have at least 8 ounces come from seafood. This includes salmon, trout, herring, and anchovies. Dairy Aim to eat 3 cup-equivalents of fat-free or low-fat dairy each day. Examples of 1 cup-equivalent of dairy include 1 cup (240 mL) milk, 8 ounces (250 g) yogurt, 1 ounces (44 g) natural cheese, or 1 cup (240 mL) fortified soy milk. Fats and oils  Aim for about 5 teaspoons (21 g) per day. Choose monounsaturated fats, such as canola and olive oils, avocados, peanut butter, and most nuts, or polyunsaturated fats, such as sunflower, corn, and soybean oils, walnuts, pine nuts,  sesame seeds, sunflower seeds, and flaxseed. Beverages  Aim for six 8-oz glasses of water per day. Limit coffee to three to five 8-oz cups per day.  Limit caffeinated beverages that have added calories, such as soda and energy drinks.  Limit alcohol intake to no more than 1 drink a day for nonpregnant women and 2 drinks a day for men. One drink equals 12 oz of beer (355 mL), 5 oz of wine (148 mL), or 1 oz of hard liquor (44 mL). Seasoning and other foods  Avoid adding excess amounts of salt to your foods. Try flavoring foods with herbs and spices instead of salt.  Avoid adding sugar to foods.  Try using oil-based dressings, sauces, and spreads instead of solid fats. This information is based on general [place] nutrition guidelines. For more  information, visit BuildDNA.es. Exact amounts may vary based on your nutrition needs. Summary  A healthy eating plan may help you to maintain a healthy weight, reduce the risk of chronic diseases, and stay active throughout your life.  Plan your meals. Make sure you eat the right portions of a variety of nutrient-rich foods.  Try baking, boiling, grilling, or broiling instead of frying.  Choose healthy options in all settings, including home, work, school, restaurants, or stores. This information is not intended to replace advice given to you by your health care provider. Make sure you discuss any questions you have with your health care provider. Document Revised: 12/13/2017 Document Reviewed: 12/13/2017 Elsevier Patient Education  [place].

## 2019-10-27 ENCOUNTER — Ambulatory Visit (HOSPITAL_COMMUNITY)
Admission: RE | Admit: 2019-10-27 | Discharge: 2019-10-27 | Disposition: A | Discharge status: Home / Self Care | Payer: Medicaid Other | Source: Ambulatory Visit | Attending: Nurse Practitioner | Admitting: Nurse Practitioner

## 2019-10-27 ENCOUNTER — Other Ambulatory Visit: Payer: Self-pay

## 2019-10-27 DIAGNOSIS — R102 Pelvic and perineal pain: Secondary | ICD-10-CM | POA: Diagnosis present

## 2019-10-30 ENCOUNTER — Telehealth: Payer: Self-pay

## 2019-10-30 ENCOUNTER — Encounter: Payer: Self-pay | Admitting: Nurse Practitioner

## 2019-10-30 DIAGNOSIS — T8332XA Displacement of intrauterine contraceptive device, initial encounter: Secondary | ICD-10-CM | POA: Insufficient documentation

## 2019-10-30 NOTE — Telephone Encounter (Addendum)
-----   Message from Virginia Rochester, NP sent at 10/30/2019  2:06 PM EST ----- Please call and notify client IUD is in place and to take Ibuprofen if needed if having cramping.  Expect cramping to decrease over the next few months.    Called pt with [facility] # [phone] and informed pt providers recommendation.  Pt asks if vaginal spotting is normal.  I explained to the pt that it is normal to have irregular vaginal bleeding and to give he IUD up until 6 months.  Pt verbalized understanding.

## 2020-01-26 ENCOUNTER — Other Ambulatory Visit: Payer: Self-pay

## 2020-01-26 ENCOUNTER — Ambulatory Visit (HOSPITAL_COMMUNITY)
Admission: EM | Admit: 2020-01-26 | Discharge: 2020-01-26 | Disposition: A | Discharge status: Home / Self Care | Payer: Medicaid Other | Attending: Emergency Medicine | Admitting: Emergency Medicine

## 2020-01-26 ENCOUNTER — Encounter (HOSPITAL_COMMUNITY): Payer: Self-pay | Admitting: Emergency Medicine

## 2020-01-26 DIAGNOSIS — R102 Pelvic and perineal pain: Secondary | ICD-10-CM

## 2020-01-26 DIAGNOSIS — R300 Dysuria: Secondary | ICD-10-CM

## 2020-01-26 LAB — POCT URINALYSIS DIP (DEVICE)
Bilirubin Urine: NEGATIVE
Glucose, UA: NEGATIVE mg/dL
Ketones, ur: NEGATIVE mg/dL
Leukocytes,Ua: NEGATIVE
Nitrite: NEGATIVE
Protein, ur: NEGATIVE mg/dL
Specific Gravity, Urine: >=1.03 (ref 1.005–1.030)
Urobilinogen, UA: 0.2 mg/dL (ref 0.0–1.0)
pH: 6 (ref 5.0–8.0)

## 2020-01-26 LAB — POC URINE PREG, ED: Preg Test, Ur: NEGATIVE

## 2020-01-26 MED ORDER — METRONIDAZOLE 500 MG PO TABS: 500.0000 mg | ORAL_TABLET | Freq: Two times a day (BID) | ORAL | 0 refills | Status: AC

## 2020-01-26 MED ORDER — PHENAZOPYRIDINE HCL 200 MG PO TABS: 200.0000 mg | ORAL_TABLET | Freq: Three times a day (TID) | ORAL | 0 refills | Status: DC | PRN

## 2020-01-26 MED ORDER — IBUPROFEN 600 MG PO TABS: 600.0000 mg | ORAL_TABLET | Freq: Four times a day (QID) | ORAL | 0 refills | Status: DC | PRN

## 2020-01-26 NOTE — Discharge Instructions (Addendum)
Prenez 600 mg d'ibuprofne combin  1000 mg de Tylenol 3-4 fois par [employer]  besoin [employer]. Buvez des liquides supplmentaires tels que de l'eau ou [place]. Terminez le Flagyl, mme si vous vous sentez mieux. Le Pyridium rendra votre urine orange, mais aidera  soulager vos symptmes urinaires. Faites un suivi avec OB / GYN si vous ne vous amliorez pas.  Voici galement [employer] de soins primaires avec laquelle vous pourrez faire un suivi pour [place] de routine.  Below is a list of primary care practices who are taking new patients for you to follow-up with.  [facility] internal medicine clinic Ground Floor - [facility], [place], [place], [place] [address] [phone]  [facility] at [facility] [address] [place], [place] [address] [phone]  [place] [facility] [place], [place] [address] [phone]  [facility]/Internal Medicine [phone] [place] [address]  [facility]: [place]  [phone]  [place] [facility]: [place]   [phone]  [facility] Family Medicine: [address] [place]  [phone]  [facility] : [address] [place] [phone]  [facility] Primary Care: [address] [place] [phone]  [facility]: [place] [phone]  Dr. Blanchie Serve [place]  [phone]  Dr. Benito Mccreedy, [facility]. [place], [place] [address]  [phone]  Go to www.goodrx.com to look up your medications. This will give  you a list of where you can find your prescriptions at the most affordable prices. Or ask the pharmacist what the cash price is, or if they have any other discount programs available to help make your medication more affordable. This can be less expensive than what you would pay with insurance.

## 2020-01-26 NOTE — ED Triage Notes (Signed)
Pt c/o acute onset abdom pain from umbilical area to suprapubic area with urinary pressure, urgency, burning on urination since yesterday morning. Denies fever, chills, n/v/d.

## 2020-01-26 NOTE — ED Provider Notes (Signed)
HPI  SUBJECTIVE:  Anne Murillo is a 34 y.o. female who presents with low mid abdominal/pelvic pain, dysuria, urgency, frequency, urinating small amount of urine each time starting yesterday.  No cloudy odorous urine, hematuria.  No vaginal odor, itch, rash, bleeding, discharge.  She had some back pain yesterday, but it has resolved.  No vomiting, fevers.  She is in a long-term monogamous relationship with a female who is asymptomatic.  STDs are not a concern today.  She tried ibuprofen without improvement in her symptoms.  There are no other aggravating or alleviating factors.  No antibiotics in the past month.  No Antipyretics in the past 4 to 6 hours.  She states that she has never had symptoms like this before.  Past medical history of pelvic pain, bilateral ovarian cyst, hypertension, C-section x3.  No history of UTI, pyelonephritis, nephrolithiasis, diabetes, gonorrhea, chlamydia, HIV, HSV, syphilis, trichomonas, BV, yeast, PID.  LMP: 4 months ago. she is 4 months postpartum and has an IUD in place.  OB/GYN.  Cannot remember name  Patient seen by OB/GYN in February of this year for pelvic pain.  IUD strings not visualized, pelvic ultrasound done which showed IUD in place.  All history obtained through language line  Past Medical History:  Diagnosis Date  . Benign essential HTN   . Bilateral ovarian cysts   . Gestational hypertension   . Irregular heart rhythm 04/24/2013    Past Surgical History:  Procedure Laterality Date  . CESAREAN SECTION N/A 12/05/2013   Procedure: Primary Cesarean Section Delivery Baby Girl @ 0136, Ap[place] 9/9;  Surgeon: Osborne Oman, MD;  Location: [facility];  Service: Obstetrics;  Laterality: N/A;  . CESAREAN SECTION N/A 07/20/2016   Procedure: CESAREAN SECTION;  Surgeon: Truett Mainland, DO;  Location: [facility];  Service: Obstetrics;  Laterality: N/A;  . CESAREAN SECTION N/A 09/15/2019   Procedure: CESAREAN SECTION;  Surgeon: Sloan Leiter, MD;   Location: [facility];  Service: Obstetrics;  Laterality: N/A;  . DILATION AND EVACUATION N/A 07/20/2015   Procedure: DILATATION AND EVACUATION;  Surgeon: Bobbye Charleston, MD;  Location: [facility];  Service: Gynecology;  Laterality: N/A;    History reviewed. No pertinent family history.  Social History   Tobacco Use  . Smoking status: Never Smoker  . Smokeless tobacco: Never Used  Substance Use Topics  . Alcohol use: No  . Drug use: No    No current facility-administered medications for this encounter.  Current Outpatient Medications:  .  AMBULATORY NON FORMULARY MEDICATION, 1 Device by Other route once a week. Medication Name: Blood Pressure cuff-Medium Monitored regularly at home ICD 10: z34.90 (Patient not taking: Reported on 09/28/2019), Disp: 1 Device, Rfl: 0 .  doxylamine, Sleep, (UNISOM) 25 MG tablet, Take 25 mg by mouth at bedtime as needed., Disp: , Rfl:  .  ibuprofen (ADVIL) 600 MG tablet, Take 1 tablet (600 mg total) by mouth every 6 (six) hours as needed., Disp: 30 tablet, Rfl: 0 .  metroNIDAZOLE (FLAGYL) 500 MG tablet, Take 1 tablet (500 mg total) by mouth 2 (two) times daily for 7 days., Disp: 14 tablet, Rfl: 0 .  phenazopyridine (PYRIDIUM) 200 MG tablet, Take 1 tablet (200 mg total) by mouth 3 (three) times daily as needed for pain., Disp: 6 tablet, Rfl: 0 .  Prenatal Vit-Fe Fumarate-FA (PREPLUS) 27-1 MG TABS, Take 1 tablet by mouth daily., Disp: 30 tablet, Rfl: 4 .  PRESCRIPTION MEDICATION, Eye drops for itching- pt unsure of name,  Disp: , Rfl:  .  pyridOXINE (VITAMIN B-6) 100 MG tablet, Take 100 mg by mouth daily., Disp: , Rfl:   No Known Allergies   ROS  As noted in HPI.   Physical Exam  BP 134/74 (BP Location: Left Arm)   Pulse 98   Temp 98.3 F (36.8 C) (Oral)   Resp 16   SpO2 100%   Constitutional: Well developed, well nourished, no acute distress Eyes:  EOMI, conjunctiva normal bilaterally HENT: Normocephalic, atraumatic,mucus membranes  moist Respiratory: Normal inspiratory effort Cardiovascular: Normal rate GI: Normal appearance, soft.  Positive suprapubic tenderness.  No flank tenderness.  Nondistended.  No guarding, rebound.  Active bowel sounds.  Positive low transverse C-section scar. GU: Normal external genitalia.  Positive odorous thin white discharge.  Normal os.  Did not see IUD strings.  Positive uterine tenderness.  No CMT.  No adnexal tenderness.  No adnexal masses.  Chaperone present during exam. Back: No CVAT skin: No rash, skin intact Musculoskeletal: no deformities Neurologic: Alert & oriented x 3, no focal neuro deficits Psychiatric: Speech and behavior appropriate   ED Course   Medications - No data to display  Orders Placed This Encounter  Procedures  . POCT urinalysis dip (device)    Standing Status:   Standing    Number of Occurrences:   1  . POC urine preg, ED (not at [facility])    Standing Status:   Standing    Number of Occurrences:   1    Results for orders placed or performed during the hospital encounter of 01/26/20 (from the past 24 hour(s))  POCT urinalysis dip (device)     Status: Abnormal   Collection Time: 01/26/20  8:49 AM  Result Value Ref Range   Glucose, UA NEGATIVE NEGATIVE mg/dL   Bilirubin Urine NEGATIVE NEGATIVE   Ketones, ur NEGATIVE NEGATIVE mg/dL   Specific Gravity, Urine >=1.030 1.005 - 1.030   Hgb urine dipstick TRACE (A) NEGATIVE   pH 6.0 5.0 - 8.0   Protein, ur NEGATIVE NEGATIVE mg/dL   Urobilinogen, UA 0.2 0.0 - 1.0 mg/dL   Nitrite NEGATIVE NEGATIVE   Leukocytes,Ua NEGATIVE NEGATIVE  POC urine preg, ED (not at [facility])     Status: None   Collection Time: 01/26/20  8:58 AM  Result Value Ref Range   Preg Test, Ur NEGATIVE NEGATIVE   No results found.  ED Clinical Impression  1. Pelvic pain   2. Dysuria      ED Assessment/Plan  UA, U preg.  Wet prep, gonorrhea, chlamydia sent.  Previous records reviewed.  As noted in HPI.  Patient is not pregnant.  Her  urine shows that it is concentrated, she has trace hematuria.  Sent off gonorrhea, chlamydia, trichomonas, BV, yeast.  Suspect BV.  Will send her home with Flagyl 500 mg p.o. twice daily for 7 days, ibuprofen 600 mg combined with 1000 mg of Tylenol 3-4 times a day as needed for pain, also some Pyridium for urinary symptoms advised increase fluid intake.  Follow-up with her OB/GYN.  She may need a repeat ultrasound to determine the location of the IUD.  Doubt uterine perforation, pelvic infection, PID.  Using the language line, discussed labs, MDM, treatment plan, and plan for follow-up with patient. Discussed sn/sx that should prompt return to the ED. patient agrees with plan.   Spent 45 minutes with the patient obtaining history, physical discussing medical decision making, treatment plan and plan for follow-up with patient.  Meds ordered this encounter  Medications  . metroNIDAZOLE (FLAGYL) 500 MG tablet    Sig: Take 1 tablet (500 mg total) by mouth 2 (two) times daily for 7 days.    Dispense:  14 tablet    Refill:  0  . ibuprofen (ADVIL) 600 MG tablet    Sig: Take 1 tablet (600 mg total) by mouth every 6 (six) hours as needed.    Dispense:  30 tablet    Refill:  0  . phenazopyridine (PYRIDIUM) 200 MG tablet    Sig: Take 1 tablet (200 mg total) by mouth 3 (three) times daily as needed for pain.    Dispense:  6 tablet    Refill:  0    *This clinic note was created using [employer]. Therefore, there may be occasional mistakes despite careful proofreading.   ?    Melynda Ripple, MD 01/26/20 [phone]

## 2020-01-29 LAB — CERVICOVAGINAL ANCILLARY ONLY
Bacterial Vaginitis (gardnerella): POSITIVE — AB
Candida Glabrata: NEGATIVE
Candida Vaginitis: NEGATIVE
Chlamydia: NEGATIVE
Comment: NEGATIVE
Comment: NEGATIVE
Comment: NEGATIVE
Comment: NEGATIVE
Comment: NEGATIVE
Comment: NORMAL
Neisseria Gonorrhea: NEGATIVE
Trichomonas: NEGATIVE

## 2020-03-08 ENCOUNTER — Ambulatory Visit (HOSPITAL_COMMUNITY)
Admission: EM | Admit: 2020-03-08 | Discharge: 2020-03-08 | Disposition: A | Discharge status: Home / Self Care | Payer: Medicaid Other | Attending: Emergency Medicine | Admitting: Emergency Medicine

## 2020-03-08 ENCOUNTER — Encounter (HOSPITAL_COMMUNITY): Payer: Self-pay | Admitting: Emergency Medicine

## 2020-03-08 DIAGNOSIS — S0502XA Injury of conjunctiva and corneal abrasion without foreign body, left eye, initial encounter: Secondary | ICD-10-CM

## 2020-03-08 DIAGNOSIS — J302 Other seasonal allergic rhinitis: Secondary | ICD-10-CM | POA: Diagnosis not present

## 2020-03-08 DIAGNOSIS — H5789 Other specified disorders of eye and adnexa: Secondary | ICD-10-CM

## 2020-03-08 MED ORDER — FLUORESCEIN SODIUM 1 MG OP STRP
ORAL_STRIP | OPHTHALMIC | Status: AC
  Filled 2020-03-08: qty 1

## 2020-03-08 MED ORDER — TETRACAINE HCL 0.5 % OP SOLN
OPHTHALMIC | Status: AC
  Filled 2020-03-08: qty 4

## 2020-03-08 MED ORDER — ERYTHROMYCIN 5 MG/GM OP OINT: TOPICAL_OINTMENT | OPHTHALMIC | 0 refills | Status: DC

## 2020-03-08 NOTE — ED Triage Notes (Signed)
Using [place] interpreter for triage.   Pt c/o bilateral eye itchiness, worse in left eye. Pt states eyes are constantly watering. Pt states she has been using OTC eye drops. Pt feels like there is something in her eye they feel scratchy.

## 2020-03-08 NOTE — ED Provider Notes (Signed)
[facility]    CSN: [id number] Arrival date & time: 03/08/20  0803      History   Chief Complaint Chief Complaint  Patient presents with   Eye Problem    HPI Anne Murillo is a 34 y.o. female.   interpt used. Pt c/o bil eye irritation but more of burning to lt eye after using drops for allergies for several days. Denies pain. No vision change.      Past Medical History:  Diagnosis Date   Benign essential HTN    Bilateral ovarian cysts    Gestational hypertension    Irregular heart rhythm 04/24/2013    Patient Active Problem List   Diagnosis Date Noted   IUD strings lost 10/30/2019   BMI 35.0-35.9,adult 10/23/2019   Obesity in pregnancy 07/14/2019   History of postpartum pre-eclampsia 04/03/2019   Leiomyoma of uterus 03/22/2019   History of 3 cesarean sections 03/16/2019   Language barrier 03/16/2019    Past Surgical History:  Procedure Laterality Date   CESAREAN SECTION N/A 12/05/2013   Procedure: Primary Cesarean Section Delivery Baby Girl @ 0136, Apgars 9/9;  Surgeon: Osborne Oman, MD;  Location: [facility];  Service: Obstetrics;  Laterality: N/A;   CESAREAN SECTION N/A 07/20/2016   Procedure: CESAREAN SECTION;  Surgeon: Truett Mainland, DO;  Location: [facility];  Service: Obstetrics;  Laterality: N/A;   CESAREAN SECTION N/A 09/15/2019   Procedure: CESAREAN SECTION;  Surgeon: Sloan Leiter, MD;  Location: [facility];  Service: Obstetrics;  Laterality: N/A;   DILATION AND EVACUATION N/A 07/20/2015   Procedure: DILATATION AND EVACUATION;  Surgeon: Bobbye Charleston, MD;  Location: [facility];  Service: Gynecology;  Laterality: N/A;    OB History    Gravida  4   Para  3   Term  3   Preterm      AB  1   Living  3     SAB  1   TAB      Ectopic      Multiple  0   Live Births  3        Obstetric Comments  11/2013 pLTCS for NRFHT in 1st stage due to lates. 3680gm         Home Medications    Prior to  Admission medications   Medication Sig Start Date End Date Taking? Authorizing Provider  AMBULATORY NON FORMULARY MEDICATION 1 Device by Other route once a week. Medication Name: Blood Pressure cuff-Medium Monitored regularly at home ICD 10: z34.90 Patient not taking: Reported on 09/28/2019 03/16/19   Chancy Milroy, MD  doxylamine, Sleep, (UNISOM) 25 MG tablet Take 25 mg by mouth at bedtime as needed.    [provider]  erythromycin ophthalmic ointment Place a 1/2 inch ribbon of ointment into the lower eyelid. 03/08/20   Marney Setting, NP  ibuprofen (ADVIL) 600 MG tablet Take 1 tablet (600 mg total) by mouth every 6 (six) hours as needed. 01/26/20   Melynda Ripple, MD  phenazopyridine (PYRIDIUM) 200 MG tablet Take 1 tablet (200 mg total) by mouth 3 (three) times daily as needed for pain. 01/26/20   Melynda Ripple, MD  Prenatal Vit-Fe Fumarate-FA (PREPLUS) 27-1 MG TABS Take 1 tablet by mouth daily. 08/29/19   Aletha Halim, MD  PRESCRIPTION MEDICATION Eye drops for itching- pt unsure of name    [provider]  pyridOXINE (VITAMIN B-6) 100 MG tablet Take 100 mg by mouth daily.    [provider]    Family History History reviewed. No pertinent family history.  Social History Social History   Tobacco Use   Smoking status: Never Smoker   Smokeless tobacco: Never Used  Scientific laboratory technician Use: Never used  Substance Use Topics   Alcohol use: No   Drug use: No     Allergies   Patient has no known allergies.   Review of Systems Review of Systems  Constitutional: Negative.   HENT: Negative.   Eyes: Positive for discharge, redness and itching.  Respiratory: Negative.   Cardiovascular: Negative.   Neurological: Negative.      Physical Exam Triage Vital Signs ED Triage Vitals  Enc Vitals Group     BP 03/08/20 0831 114/78     Pulse Rate 03/08/20 0831 69     Resp 03/08/20 0831 14     Temp 03/08/20 0831 98.6 F (37 C)     Temp  Source 03/08/20 0831 Oral     SpO2 --      Weight --      Height --      Head Circumference --      Peak Flow --      Pain Score 03/08/20 0828 10     Pain Loc --      Pain Edu? --      Excl. in [place]? --    No data found.  Updated Vital Signs BP 114/78 (BP Location: Left Arm)    Pulse 69    Temp 98.6 F (37 C) (Oral)    Resp 14    Breastfeeding Yes   Visual Acuity Right Eye Distance: 20/30 Left Eye Distance: 20/30 Bilateral Distance: 20/20  Right Eye Near:   Left Eye Near:    Bilateral Near:     Physical Exam HENT:     Right Ear: Tympanic membrane normal.     Left Ear: Tympanic membrane normal.     Nose: Nose normal.  Eyes:     General:        Right eye: No discharge.        Left eye: Discharge present.    Comments: Lt conjunctiva erythema, yellow drainage,   Cardiovascular:     Rate and Rhythm: Normal rate.  Pulmonary:     Effort: Pulmonary effort is normal.  Musculoskeletal:     Cervical back: Normal range of motion.  Skin:    General: Skin is warm.  Neurological:     Mental Status: She is alert.      UC Treatments / Results  Labs (all labs ordered are listed, but only abnormal results are displayed) Labs Reviewed - No data to display  EKG   Radiology No results found.  Procedures Procedures (including critical care time)  Medications Ordered in UC Medications - No data to display  Initial Impression / Assessment and Plan / UC Course  I have reviewed the triage vital signs and the nursing notes.  Pertinent labs & imaging results that were available during my care of the patient were reviewed by me and considered in my medical decision making (see chart for details).    Will need to see ent if symptoms become worse or go to the ER  Avoid using eye drops with preservatives  Final Clinical Impressions(s) / UC Diagnoses   Final diagnoses:  Abrasion of left cornea, initial encounter  Seasonal allergies  Eye irritation     Discharge  Instructions     Go the er  is you begin to have visual change or pain behind your eye Prevent using eye drops with preservatives use only saline drops  May use warm compresses to eyes to help sooth Try to avoid rubbing eyes       ED Prescriptions    Medication Sig Dispense Auth. Provider   erythromycin ophthalmic ointment Place a 1/2 inch ribbon of ointment into the lower eyelid. 3 g Marney Setting, NP     PDMP not reviewed this encounter.   Marney Setting, NP 03/08/20 [phone]

## 2020-03-08 NOTE — Discharge Instructions (Addendum)
Go the er is you begin to have visual change or pain behind your eye Prevent using eye drops with preservatives use only saline drops  May use warm compresses to eyes to help sooth Try to avoid rubbing eyes

## 2020-03-13 ENCOUNTER — Encounter: Payer: Self-pay | Admitting: Medical

## 2020-03-13 ENCOUNTER — Ambulatory Visit: Payer: Medicaid Other | Admitting: Medical

## 2020-03-13 ENCOUNTER — Other Ambulatory Visit: Payer: Self-pay

## 2020-03-13 VITALS — BP 122/82 | HR 88 | Temp 97.8°F | Wt 207.4 lb

## 2020-03-13 DIAGNOSIS — H109 Unspecified conjunctivitis: Secondary | ICD-10-CM

## 2020-03-13 MED ORDER — POLYMYXIN B-TRIMETHOPRIM 10000-0.1 UNIT/ML-% OP SOLN
1.0000 [drp] | Freq: Four times a day (QID) | OPHTHALMIC | 0 refills | Status: DC
Start: 2020-03-13 — End: 2020-11-04

## 2020-03-13 MED ORDER — OLOPATADINE HCL 0.2 % OP SOLN: 1.0000 [drp] | Freq: Two times a day (BID) | OPHTHALMIC | 2 refills | Status: DC

## 2020-03-13 NOTE — Patient Instructions (Addendum)
Your left eye is somewhat red with some watery drainage.  However I do think your eye is probably improving compared to the emergency department notes from recent visit  I do think the antibiotic ointment you have been using a treatment choice.  However if you do not feel like it is helping then let us use the regimen below.   I am going to have you try an antibiotic eyedrop called Polytrim 1 drop every 6 hours for the next 5 to 7 days.  This is an alternative to the ointment antibiotic  You have had ongoing itchy eyes suggesting allergies sounds like a month.  I do recommend you restart Pataday eyedrops.  I sent this to the pharmacy as well.  This is an allergy eyedrops for redness and itching.  In the next week you can use moist wet rag compresses over the eyes for comfort and for drainage.  Shower in the morning to let the shower water clean out around the eye.  Otherwise avoid touching your face and eyes so you do not transmit any infection to other people  If not improving over the next 2 weeks then I would see an eye doctor.   EYE DOCTOR RECOMMENDATIONS [facility] Dr. Camillo Flaming [address], [place], [place] [address]  [email or web address]    Dr. Webb Laws [place], [place], [place] [address] [phone]

## 2020-03-13 NOTE — Progress Notes (Signed)
  Subjective: Anne Murillo is a 34 y.o. female who presents for itchy eye.    Here today with her 24-month-old baby.  She does speak [place] but does not read [place]  She is a returning patient, last visit a few years ago.  She was seen by emergency department for the same symptoms recently and put on erythromycin ointment which she is using but does not like it is helping.  She started this 5 days ago.  She notes ongoing itchy eyes for months but in the past 2 weeks her left eye was red and watery and goopy.  No vision loss, no blurred vision.  No fever no other respiratory symptoms or head symptoms.  No recent concern for foreign body in the eye.  No sick contacts with similar.  No other aggravating or relieving factors.  No other c/o.  Past Medical History:  Diagnosis Date  . Benign essential HTN   . Bilateral ovarian cysts   . Gestational hypertension   . Irregular heart rhythm 04/24/2013   No current outpatient medications on file prior to visit.   No current facility-administered medications on file prior to visit.    ROS as in subjective   Objective: BP 122/82   Pulse 88   Temp 97.8 F (36.6 C)   Wt 207 lb 6.4 oz (94.1 kg)   LMP  (LMP Unknown)   Breastfeeding Yes   BMI 39.19 kg/m   General appearance: alert, no distress Left eye conjunctive a slightly erythematous and watery otherwise no deformity, no erythema no pus no crusting, PERRLA, EOMI, rest of eye exam unremarkable Nares patent, no discharge or erythema Pharynx normal, tonsils unremarkable Oral cavity: MMM, no lesions Neck: supple, no lymphadenopathy, no thyromegaly, no masses       Assessment  Encounter Diagnosis  Name Primary?  . Conjunctivitis of both eyes, unspecified conjunctivitis type Yes      Plan: Reviewed recent emergency department notes.  Today her eyes look improved but not resolved.  She does not want to continue E-Mycin so we will change the Polytrim for the next 5 days in the left  eye, she will begin higher dose of Pataday drops for allergies.  I advise she can use this Pataday ongoing for the next 2 months if needed.  We discussed hygiene, eye care, and if not much improved within the next 1 to 2 weeks she may need to follow-up with eye doctor  Patient voiced understanding of diagnosis, recommendations, and treatment plan.  After visit summary given.  Her husband speaks and reads English so she he will review these recommendations again with her at home  Anne Murillo was seen today for other.  Diagnoses and all orders for this visit:  Conjunctivitis of both eyes, unspecified conjunctivitis type  Other orders -     trimethoprim-polymyxin b (POLYTRIM) ophthalmic solution; Place 1 drop into both eyes every 6 (six) hours. -     Olopatadine HCl 0.2 % SOLN; Apply 1 drop to eye in the morning and at bedtime.

## 2020-09-13 ENCOUNTER — Encounter (HOSPITAL_COMMUNITY): Payer: Self-pay | Admitting: Emergency Medicine

## 2020-09-13 ENCOUNTER — Emergency Department (HOSPITAL_COMMUNITY)
Admission: EM | Admit: 2020-09-13 | Discharge: 2020-09-14 | Disposition: A | Discharge status: Home / Self Care | Payer: Medicaid Other | Attending: Emergency Medicine | Admitting: Emergency Medicine

## 2020-09-13 ENCOUNTER — Other Ambulatory Visit: Payer: Self-pay

## 2020-09-13 DIAGNOSIS — R109 Unspecified abdominal pain: Secondary | ICD-10-CM | POA: Diagnosis present

## 2020-09-13 DIAGNOSIS — R1031 Right lower quadrant pain: Secondary | ICD-10-CM | POA: Diagnosis not present

## 2020-09-13 NOTE — ED Triage Notes (Signed)
Pt arrives via POV from home with abdominal pain periumbilical. Worsens with palpation. Denies recent n/v/diarrhea. LMP 11/20.

## 2020-09-14 ENCOUNTER — Emergency Department (HOSPITAL_COMMUNITY): Payer: Medicaid Other

## 2020-09-14 DIAGNOSIS — R1031 Right lower quadrant pain: Secondary | ICD-10-CM | POA: Diagnosis not present

## 2020-09-14 LAB — CBC
HCT: 40.1 % (ref 36.0–46.0)
Hemoglobin: 12.8 g/dL (ref 12.0–15.0)
MCH: 27.5 pg (ref 26.0–34.0)
MCHC: 31.9 g/dL (ref 30.0–36.0)
MCV: 86.2 fL (ref 80.0–100.0)
Platelets: 186 10*3/uL (ref 150–400)
RBC: 4.65 MIL/uL (ref 3.87–5.11)
RDW: 13.3 % (ref 11.5–15.5)
WBC: 7.2 10*3/uL (ref 4.0–10.5)
nRBC: 0 % (ref 0.0–0.2)

## 2020-09-14 LAB — URINALYSIS, ROUTINE W REFLEX MICROSCOPIC
Bilirubin Urine: NEGATIVE
Glucose, UA: NEGATIVE mg/dL
Hgb urine dipstick: NEGATIVE
Ketones, ur: NEGATIVE mg/dL
Leukocytes,Ua: NEGATIVE
Nitrite: NEGATIVE
Protein, ur: NEGATIVE mg/dL
Specific Gravity, Urine: 1.008 (ref 1.005–1.030)
pH: 6 (ref 5.0–8.0)

## 2020-09-14 LAB — COMPREHENSIVE METABOLIC PANEL
ALT: 25 U/L (ref 0–44)
AST: 23 U/L (ref 15–41)
Albumin: 3.8 g/dL (ref 3.5–5.0)
Alkaline Phosphatase: 48 U/L (ref 38–126)
Anion gap: 9 (ref 5–15)
BUN: 15 mg/dL (ref 6–20)
CO2: 24 mmol/L (ref 22–32)
Calcium: 9.6 mg/dL (ref 8.9–10.3)
Chloride: 103 mmol/L (ref 98–111)
Creatinine, Ser: 0.92 mg/dL (ref 0.44–1.00)
GFR, Estimated: >60 mL/min (ref >60)
Glucose, Bld: 125 mg/dL — ABNORMAL HIGH (ref 70–99)
Potassium: 3.7 mmol/L (ref 3.5–5.1)
Sodium: 136 mmol/L (ref 135–145)
Total Bilirubin: 0.8 mg/dL (ref 0.3–1.2)
Total Protein: 7.1 g/dL (ref 6.5–8.1)

## 2020-09-14 LAB — LIPASE, BLOOD: Lipase: 36 U/L (ref 11–51)

## 2020-09-14 LAB — I-STAT BETA HCG BLOOD, ED (MC, WL, AP ONLY): I-stat hCG, quantitative: <5 m[IU]/mL (ref <5)

## 2020-09-14 MED ORDER — IOHEXOL 300 MG/ML  SOLN
100.0000 mL | Freq: Once | INTRAMUSCULAR | Status: AC | PRN
  Administered 2020-09-14: 100 mL via INTRAVENOUS

## 2020-09-14 MED ORDER — FENTANYL CITRATE (PF) 100 MCG/2ML IJ SOLN
50.0000 ug | Freq: Once | INTRAMUSCULAR | Status: AC
  Administered 2020-09-14: 50 ug via INTRAVENOUS
  Filled 2020-09-14: qty 2

## 2020-09-14 MED ORDER — ONDANSETRON HCL 4 MG/2ML IJ SOLN
4.0000 mg | Freq: Once | INTRAMUSCULAR | Status: AC
  Administered 2020-09-14: 4 mg via INTRAVENOUS
  Filled 2020-09-14: qty 2

## 2020-09-14 MED ORDER — CEPHALEXIN 500 MG PO CAPS
500.0000 mg | ORAL_CAPSULE | Freq: Three times a day (TID) | ORAL | 0 refills | Status: DC
Start: 2020-09-14 — End: 2020-11-04

## 2020-09-14 NOTE — ED Provider Notes (Signed)
[facility] Provider Note   CSN: [id number] Arrival date & time: 09/13/20  2332     History Chief Complaint  Patient presents with  . Abdominal Pain    Anne Murillo is a 35 y.o. female.  The history is provided by the patient and medical records. A language interpreter was used.  Abdominal Pain  Anne Murillo is a 35 y.o. female who presents to the Emergency Department complaining of abdominal pain. She presents the emergency department complaining of periumbilical and lower abdominal pain that began three days ago. Pain is sharp in nature, nonradiating and constant. She denies any fevers, nausea, vomiting. She does have some associated dysuria. No vaginal discharge. She states that when she tries to urinate it feels like something is trying to come out of her navel. No change in appetite. Last meal was last night. She did have a similar episode back in May, which resolved.    Past Medical History:  Diagnosis Date  . Benign essential HTN   . Bilateral ovarian cysts   . Gestational hypertension   . Irregular heart rhythm 04/24/2013    Patient Active Problem List   Diagnosis Date Noted  . IUD strings lost 10/30/2019  . BMI 35.0-35.9,adult 10/23/2019  . Obesity in pregnancy 07/14/2019  . History of postpartum pre-eclampsia 04/03/2019  . Leiomyoma of uterus 03/22/2019  . History of 3 cesarean sections 03/16/2019  . Language barrier 03/16/2019    Past Surgical History:  Procedure Laterality Date  . CESAREAN SECTION N/A 12/05/2013   Procedure: Primary Cesarean Section Delivery Baby Girl @ 0136, Apgars 9/9;  Surgeon: Osborne Oman, MD;  Location: [facility];  Service: Obstetrics;  Laterality: N/A;  . CESAREAN SECTION N/A 07/20/2016   Procedure: CESAREAN SECTION;  Surgeon: Truett Mainland, DO;  Location: [facility];  Service: Obstetrics;  Laterality: N/A;  . CESAREAN SECTION N/A 09/15/2019   Procedure: CESAREAN SECTION;   Surgeon: Sloan Leiter, MD;  Location: [facility];  Service: Obstetrics;  Laterality: N/A;  . DILATION AND EVACUATION N/A 07/20/2015   Procedure: DILATATION AND EVACUATION;  Surgeon: Bobbye Charleston, MD;  Location: [facility];  Service: Gynecology;  Laterality: N/A;     OB History    Gravida  4   Para  3   Term  3   Preterm      AB  1   Living  3     SAB  1   IAB      Ectopic      Multiple  0   Live Births  3        Obstetric Comments  11/2013 pLTCS for NRFHT in 1st stage due to lates. 3680gm        History reviewed. No pertinent family history.  Social History   Tobacco Use  . Smoking status: Never Smoker  . Smokeless tobacco: Never Used  Vaping Use  . Vaping Use: Never used  Substance Use Topics  . Alcohol use: No  . Drug use: No    Home Medications Prior to Admission medications   Medication Sig Start Date End Date Taking? Authorizing Provider  cephALEXin (KEFLEX) 500 MG capsule Take 1 capsule (500 mg total) by mouth 3 (three) times daily. 09/14/20  Yes Quintella Reichert, MD  Olopatadine HCl 0.2 % SOLN Apply 1 drop to eye in the morning and at bedtime. 03/13/20   Tysinger, Camelia Eng, PA-C  trimethoprim-polymyxin b (POLYTRIM) ophthalmic solution Place 1 drop into  both eyes every 6 (six) hours. 03/13/20   Tysinger, Kermit Balo, PA-C    Allergies    Patient has no known allergies.  Review of Systems   Review of Systems  Gastrointestinal: Positive for abdominal pain.  All other systems reviewed and are negative.   Physical Exam Updated Vital Signs BP 115/82   Pulse 78   Temp 98.1 F (36.7 C) (Oral)   Resp 17   Ht 5\' 3"  (1.6 m)   Wt 90.7 kg   LMP 08/07/2020   SpO2 96%   BMI 35.43 kg/m   Physical Exam Vitals and nursing note reviewed.  Constitutional:      Appearance: She is well-developed and well-nourished.  HENT:     Head: Normocephalic and atraumatic.  Cardiovascular:     Rate and Rhythm: Normal rate and regular rhythm.     Heart sounds: No  murmur heard.   Pulmonary:     Effort: Pulmonary effort is normal. No respiratory distress.     Breath sounds: Normal breath sounds.  Abdominal:     Palpations: Abdomen is soft.     Tenderness: There is abdominal tenderness. There is no guarding or rebound.     Comments: Moderate RLQ abdominal pain and fullness  Musculoskeletal:        General: No tenderness or edema.  Skin:    General: Skin is warm and dry.  Neurological:     Mental Status: She is alert and oriented to person, place, and time.  Psychiatric:        Mood and Affect: Mood and affect normal.        Behavior: Behavior normal.     ED Results / Procedures / Treatments   Labs (all labs ordered are listed, but only abnormal results are displayed) Labs Reviewed  COMPREHENSIVE METABOLIC PANEL - Abnormal; Notable for the following components:      Result Value   Glucose, Bld 125 (*)    All other components within normal limits  URINALYSIS, ROUTINE W REFLEX MICROSCOPIC - Abnormal; Notable for the following components:   Color, Urine STRAW (*)    All other components within normal limits  URINE CULTURE  LIPASE, BLOOD  CBC  I-STAT BETA HCG BLOOD, ED ([facility], [facility], [facility] ONLY)    EKG None  Radiology CT Abdomen Pelvis W Contrast  Result Date: 09/14/2020 CLINICAL DATA:  Right lower quadrant abdomen pain. EXAM: CT ABDOMEN AND PELVIS WITH CONTRAST TECHNIQUE: Multidetector CT imaging of the abdomen and pelvis was performed using the standard protocol following bolus administration of intravenous contrast. CONTRAST:  11/12/2020 OMNIPAQUE IOHEXOL 300 MG/ML  SOLN COMPARISON:  None. FINDINGS: Lower chest: No acute abnormality. Hepatobiliary: No focal liver abnormality is seen. No gallstones, gallbladder wall thickening, or biliary dilatation. Pancreas: Unremarkable. No pancreatic ductal dilatation or surrounding inflammatory changes. Spleen: Normal in size without focal abnormality. Adrenals/Urinary Tract: Adrenal glands are unremarkable.  Kidneys are normal, without renal calculi, focal lesion, or hydronephrosis. Bladder is unremarkable. Stomach/Bowel: There is masslike opacity anterior to bladder inseparable from small bowel loops in the right lower quadrant. Surrounding fluid and inflammation is identified around the masslike area/small bowel loops and anterior bladder. The appendix is normal. Colon is normal. The stomach is normal. Vascular/Lymphatic: No significant vascular findings are present. No enlarged abdominal or pelvic lymph nodes. Reproductive: Uterus and bilateral adnexa are unremarkable. IUD is identified in the uterus. Other: Broad-based ventral herniation of mesenteric fat and bowel loops are identified in the umbilicus. Musculoskeletal: No acute or  significant osseous findings. IMPRESSION: 1. Masslike opacity anterior to bladder inseparable from small bowel loops in the right lower quadrant. Surrounding fluid and inflammation is identified around the masslike area/small bowel loops and anterior bladder. The findings are nonspecific but favor infectious/inflammatory etiology, possibly in urachal remnant. Follow-up after treatment is recommended to ensure resolution and to ensure no underlying mass is present. 2. Normal appendix. 3. Broad-based ventral herniation of mesenteric fat and bowel loops in the umbilicus. Electronically Signed   By: Abelardo Diesel M.D.   On: 09/14/2020 09:01    Procedures Procedures (including critical care time)  Medications Ordered in ED Medications  fentaNYL (SUBLIMAZE) injection 50 mcg (50 mcg Intravenous Given 09/14/20 0806)  ondansetron (ZOFRAN) injection 4 mg (4 mg Intravenous Given 09/14/20 0807)  iohexol (OMNIPAQUE) 300 MG/ML solution 100 mL (100 mLs Intravenous Contrast Given 09/14/20 [id number])    ED Course  I have reviewed the triage vital signs and the nursing notes.  Pertinent labs & imaging results that were available during my care of the patient were reviewed by me and considered in my  medical decision making (see chart for details).    MDM Rules/Calculators/A&P                         patient here for evaluation of three days of abdominal pain, dysuria and is sensation that urine needs to come from her umbilicus. She has focal right lower quadrant tenderness and fullness on examination without peritoneal findings. She has no systemic symptoms. Labs are within normal limits. CT abdomen pelvis concerning for fluid collection versus urachal remnant. Discussed with Dr. Zenia Resides with surgery, who evaluated the patient. She recommends urology consult. Discussed with Dr. Milford Cage with urology. Will start on empiric antibiotics for possible urinary tract infection with close urology follow-up for further evaluation and discussion. Discussed with patient findings of studies and treatment plan and she is in agreement with plan.  Final Clinical Impression(s) / ED Diagnoses Final diagnoses:  Right lower quadrant abdominal pain    Rx / DC Orders ED Discharge Orders         Ordered    cephALEXin (KEFLEX) 500 MG capsule  3 times daily        09/14/20 1349           Quintella Reichert, MD 09/14/20 1450

## 2020-09-14 NOTE — Discharge Instructions (Signed)
Your CT scan showed some changes to your bladder that need to be seen by the urologist. Please call to get an appointment later this week. Get rechecked immediately if you have fevers, cannot urinate or start draining fluid from your abdomen or new concerning symptoms.

## 2020-09-14 NOTE — ED Notes (Signed)
Patient verbalizes understanding of discharge instructions. Opportunity for questioning and answers were provided. Armband removed by staff, pt discharged from ED and ambulated to lobby to return home.   

## 2020-09-15 LAB — URINE CULTURE: Culture: NO GROWTH

## 2020-09-15 NOTE — Consult Note (Signed)
Anne Murillo October 10, 1985  [id number].    Requesting MD: Dr. Madilyn Hook Chief Complaint/Reason for Consult: umbilical hernia, intraabdominal mass  HPI:  Anne Murillo is a 35 yo female who presented to the ED with several days of abdominal pain. History obtained with the assistance of a [place] interpreter via phone. The pain began 4-5 days ago and is in the suprapubic area extending to the umbilicus. The pain is worse with urination. She says she had a similar episode a few months ago and was told it was a UTI and was treated with antibiotics. She has not had any fevers. In the ED she is afebrile and hemodynamically stable. Labs are overall unremarkable and UA is clean. A CT was done and shows an inflammatory mass-like lesion anterior to the bladder, as well as an umbilical hernia. General surgery was consulted for evaluation.   ROS: Review of Systems  Constitutional: Negative for chills and fever.  Respiratory: Negative for shortness of breath.   Cardiovascular: Negative for chest pain.  Gastrointestinal: Positive for abdominal pain. Negative for nausea and vomiting.  Genitourinary:       Suprapubic pain with urination    History reviewed. No pertinent family history.  Past Medical History:  Diagnosis Date  . Benign essential HTN   . Bilateral ovarian cysts   . Gestational hypertension   . Irregular heart rhythm 04/24/2013    Past Surgical History:  Procedure Laterality Date  . CESAREAN SECTION N/A 12/05/2013   Procedure: Primary Cesarean Section Delivery Baby Girl @ 0136, Apgars 9/9;  Surgeon: Tereso Newcomer, MD;  Location: [facility];  Service: Obstetrics;  Laterality: N/A;  . CESAREAN SECTION N/A 07/20/2016   Procedure: CESAREAN SECTION;  Surgeon: Levie Heritage, DO;  Location: [facility] BIRTHING SUIT[facility];  Service: Obstetrics;  Laterality: N/A;  . CESAREAN SECTION N/A 09/15/2019   Procedure: CESAREAN SECTION;  Surgeon: Conan Bowens, MD;  Location: [facility] LD ORS;  Service: Obstetrics;   Laterality: N/A;  . DILATION AND EVACUATION N/A 07/20/2015   Procedure: DILATATION AND EVACUATION;  Surgeon: Carrington Clamp, MD;  Location: [facility];  Service: Gynecology;  Laterality: N/A;    Social History:  reports that she has never smoked. She has never used smokeless tobacco. She reports that she does not drink alcohol and does not use drugs.  Allergies: No Known Allergies  (Not in a hospital admission)    Physical Exam: Blood pressure 115/82, pulse 78, temperature 98.1 F (36.7 C), temperature source Oral, resp. rate 17, height 5\' 3"  (1.6 m), weight 90.7 kg, last menstrual period 08/07/2020, SpO2 96 %, currently breastfeeding. General: resting comfortably, appears stated age, no apparent distress Neurological: alert and oriented, no focal deficits, cranial nerves grossly in tact HEENT: normocephalic, atraumatic, oropharynx clear, no scleral icterus CV: regular rate and rhythm, extremities warm and well-perfused Respiratory: normal work of breathing, symmetric chest wall expansion Abdomen: soft, nondistended, tender to palpation in suprapubic and periumbilical area. There is an umbilical hernia that is very soft and easily reducible. No peritoneal signs. Extremities: warm and well-perfused, no deformities, moving all extremities spontaneously Psychiatric: normal mood and affect Skin: warm and dry, no jaundice, no rashes or lesions   Results for orders placed or performed during the hospital encounter of 09/13/20 (from the past 48 hour(s))  Urinalysis, Routine w reflex microscopic Urine, Clean Catch     Status: Abnormal   Collection Time: 09/13/20 11:46 PM  Result Value Ref Range   Color, Urine STRAW (A) YELLOW  APPearance CLEAR CLEAR   Specific Gravity, Urine 1.008 1.005 - 1.030   pH 6.0 5.0 - 8.0   Glucose, UA NEGATIVE NEGATIVE mg/dL   Hgb urine dipstick NEGATIVE NEGATIVE   Bilirubin Urine NEGATIVE NEGATIVE   Ketones, ur NEGATIVE NEGATIVE mg/dL   Protein, ur NEGATIVE  NEGATIVE mg/dL   Nitrite NEGATIVE NEGATIVE   Leukocytes,Ua NEGATIVE NEGATIVE    Comment: Performed at [facility] [address], [place], [place] [address]  Lipase, blood     Status: None   Collection Time: 09/14/20 12:12 AM  Result Value Ref Range   Lipase 36 11 - 51 U/L    Comment: Performed at [facility] [address], [place], [place] [address]  Comprehensive metabolic panel     Status: Abnormal   Collection Time: 09/14/20 12:12 AM  Result Value Ref Range   Sodium 136 135 - 145 mmol/L   Potassium 3.7 3.5 - 5.1 mmol/L   Chloride 103 98 - 111 mmol/L   CO2 24 22 - 32 mmol/L   Glucose, Bld 125 (H) 70 - 99 mg/dL    Comment: Glucose reference range applies only to samples taken after fasting for at least 8 hours.   BUN 15 6 - 20 mg/dL   Creatinine, Ser 0.92 0.44 - 1.00 mg/dL   Calcium 9.6 8.9 - 10.3 mg/dL   Total Protein 7.1 6.5 - 8.1 g/dL   Albumin 3.8 3.5 - 5.0 g/dL   AST 23 15 - 41 U/L   ALT 25 0 - 44 U/L   Alkaline Phosphatase 48 38 - 126 U/L   Total Bilirubin 0.8 0.3 - 1.2 mg/dL   GFR, Estimated >60 >60 mL/min    Comment: (NOTE) Calculated using the CKD-EPI Creatinine Equation (2021)    Anion gap 9 5 - 15    Comment: Performed at [facility] [address], [place], [place] [address]  CBC     Status: None   Collection Time: 09/14/20 12:12 AM  Result Value Ref Range   WBC 7.2 4.0 - 10.5 K/uL   RBC 4.65 3.87 - 5.11 MIL/uL   Hemoglobin 12.8 12.0 - 15.0 g/dL   HCT 40.1 36.0 - 46.0 %   MCV 86.2 80.0 - 100.0 fL   MCH 27.5 26.0 - 34.0 pg   MCHC 31.9 30.0 - 36.0 g/dL   RDW 13.3 11.5 - 15.5 %   Platelets 186 150 - 400 K/uL   nRBC 0.0 0.0 - 0.2 %    Comment: Performed at [facility], [place] [address] [place], [place] [address]  I-Stat beta hCG blood, ED     Status: None   Collection Time: 09/14/20  1:28 AM  Result Value Ref Range   I-stat hCG, quantitative <5.0 <5 mIU/mL   Comment 3            Comment:   GEST. AGE      CONC.  (mIU/mL)    <=1 WEEK        5 - 50     2 WEEKS       50 - 500     3 WEEKS       100 - 10,000     4 WEEKS     1,000 - 30,000        FEMALE AND NON-PREGNANT FEMALE:     LESS THAN 5 mIU/mL    CT Abdomen Pelvis W Contrast  Result Date: 09/14/2020 CLINICAL DATA:  Right lower quadrant abdomen pain. EXAM: CT ABDOMEN AND PELVIS WITH CONTRAST TECHNIQUE: Multidetector CT imaging of the abdomen and pelvis was performed using the standard protocol following bolus administration of intravenous contrast. CONTRAST:  114mL OMNIPAQUE IOHEXOL 300 MG/ML  SOLN COMPARISON:  None. FINDINGS: Lower chest: No acute abnormality. Hepatobiliary: No focal liver abnormality is seen. No gallstones, gallbladder wall thickening, or biliary dilatation. Pancreas: Unremarkable. No pancreatic ductal dilatation or surrounding inflammatory changes. Spleen: Normal in size without focal abnormality. Adrenals/Urinary Tract: Adrenal glands are unremarkable. Kidneys are normal, without renal calculi, focal lesion, or hydronephrosis. Bladder is unremarkable. Stomach/Bowel: There is masslike opacity anterior to bladder inseparable from small bowel loops in the right lower quadrant. Surrounding fluid and inflammation is identified around the masslike area/small bowel loops and anterior bladder. The appendix is normal. Colon is normal. The stomach is normal. Vascular/Lymphatic: No significant vascular findings are present. No enlarged abdominal or pelvic lymph nodes. Reproductive: Uterus and bilateral adnexa are unremarkable. IUD is identified in the uterus. Other: Broad-based ventral herniation of mesenteric fat and bowel loops are identified in the umbilicus. Musculoskeletal: No acute or significant osseous findings. IMPRESSION: 1. Masslike opacity anterior to bladder inseparable from small bowel loops in the right lower quadrant. Surrounding fluid and inflammation is identified around the masslike area/small bowel loops and anterior bladder. 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 Follow-up after treatment is recommended to ensure resolution and to ensure no underlying mass is present. 2. Normal appendix. 3. Broad-based ventral herniation of mesenteric fat and bowel loops in the umbilicus. Electronically Signed   By: Abelardo Diesel M.D.   On: 09/14/2020 09:01      Assessment/Plan 35 yo female presenting with 4-5 days of suprapubic/periumbilical abdominal pain associated with urination. I reviewed her CT scan - umbilical hernia is broad with no signs of bowel obstruction or strangulation. I do not think her pain is related to the hernia. There is a collection vs mass anterior to the bladder with stranding - no associated bowel obstruction. 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 Given this and the patient's urinary symptoms, recommend evaluation by urology. Discussed plan with Dr. Ralene Bathe.  Michaelle Birks, MD [facility], Hepatobiliary and Pancreatic Surgery 09/15/20 6:44 AM

## 2020-09-19 DIAGNOSIS — Q644 Malformation of urachus: Secondary | ICD-10-CM | POA: Diagnosis not present

## 2020-10-17 ENCOUNTER — Telehealth: Payer: Self-pay | Admitting: Family Medicine

## 2020-10-17 NOTE — Telephone Encounter (Signed)
Pt spouse states need to speak with someone concerning pt IUD to make sure everything is ok. States pt has not had cycle in 2 months and needs to know if this is normal..

## 2020-10-21 NOTE — Telephone Encounter (Signed)
Called pt w/[employer] interpreter # [phone] and discussed her concern. Pt stated that she has not had a period for 2 months. Previously she  had regular periods since IUD insertion last year. I advised that it is not unusual to skip or miss a period from time to time while having an IUD. Pt also wanted to know what type of IUD she has in place. Per chart review, I was not able to determine that information. She does not know if her IUD contains hormone and was told it would last for 5-10 years. Pt desires a check up to see if "everything is all right". Pt was advised that she will be scheduled for Annual Gyn exam and she agreed. She desires an appointment on a Tuesday morning. Pt voiced understanding of all information and instructions given.

## 2020-10-29 DIAGNOSIS — K420 Umbilical hernia with obstruction, without gangrene: Secondary | ICD-10-CM | POA: Diagnosis not present

## 2020-10-29 DIAGNOSIS — Q644 Malformation of urachus: Secondary | ICD-10-CM | POA: Diagnosis not present

## 2020-11-01 ENCOUNTER — Other Ambulatory Visit: Payer: Self-pay | Admitting: Urology

## 2020-11-04 ENCOUNTER — Encounter (HOSPITAL_BASED_OUTPATIENT_CLINIC_OR_DEPARTMENT_OTHER): Payer: Self-pay | Admitting: Urology

## 2020-11-04 ENCOUNTER — Other Ambulatory Visit: Payer: Self-pay

## 2020-11-04 ENCOUNTER — Other Ambulatory Visit (HOSPITAL_COMMUNITY)
Admission: RE | Admit: 2020-11-04 | Discharge: 2020-11-04 | Disposition: A | Discharge status: Home / Self Care | Payer: Medicaid Other | Source: Ambulatory Visit | Attending: Urology | Admitting: Urology

## 2020-11-04 DIAGNOSIS — Z01812 Encounter for preprocedural laboratory examination: Secondary | ICD-10-CM | POA: Diagnosis present

## 2020-11-04 DIAGNOSIS — Z20822 Contact with and (suspected) exposure to covid-19: Secondary | ICD-10-CM | POA: Diagnosis not present

## 2020-11-04 LAB — SARS CORONAVIRUS 2 (TAT 6-24 HRS): SARS Coronavirus 2: NEGATIVE

## 2020-11-04 NOTE — Progress Notes (Addendum)
Spoke w/ via phone for pre-op interview---pt spouse nassirou, pt speaks french Lab needs dos----  I stat    Urine poct          Lab results------none COVID test ------11-04-2020 1030 Arrive at -------1230 pm 11-06-2020 NPO after MN NO Solid Food.  Water  from MN until---1100 am then npo Medications to take morning of surgery -----none Diabetic medication -----n/a Patient Special Instructions -----none Pre-Op special Istructions -----none Patient verbalized understanding of instructions that were given at this phone interview. Patient denies shortness of breath, chest pain, fever, cough at this phone interview.  Pt spouse states patient can use a french interpreter no certain dialect needed, french interpreter requested for [place], email on chart

## 2020-11-06 ENCOUNTER — Encounter (HOSPITAL_BASED_OUTPATIENT_CLINIC_OR_DEPARTMENT_OTHER)
Admission: RE | Disposition: A | Discharge status: Home / Self Care | Payer: Self-pay | Source: Home / Self Care | Attending: Urology

## 2020-11-06 ENCOUNTER — Ambulatory Visit (HOSPITAL_BASED_OUTPATIENT_CLINIC_OR_DEPARTMENT_OTHER): Payer: Medicaid Other | Admitting: Certified Registered Nurse Anesthetist

## 2020-11-06 ENCOUNTER — Ambulatory Visit (HOSPITAL_BASED_OUTPATIENT_CLINIC_OR_DEPARTMENT_OTHER)
Admission: RE | Admit: 2020-11-06 | Discharge: 2020-11-06 | Disposition: A | Discharge status: Home / Self Care | Payer: Medicaid Other | Attending: Urology | Admitting: Urology

## 2020-11-06 ENCOUNTER — Encounter (HOSPITAL_BASED_OUTPATIENT_CLINIC_OR_DEPARTMENT_OTHER): Payer: Self-pay | Admitting: Urology

## 2020-11-06 DIAGNOSIS — N3289 Other specified disorders of bladder: Secondary | ICD-10-CM | POA: Diagnosis not present

## 2020-11-06 DIAGNOSIS — K429 Umbilical hernia without obstruction or gangrene: Secondary | ICD-10-CM | POA: Diagnosis not present

## 2020-11-06 DIAGNOSIS — D494 Neoplasm of unspecified behavior of bladder: Secondary | ICD-10-CM | POA: Diagnosis not present

## 2020-11-06 DIAGNOSIS — E669 Obesity, unspecified: Secondary | ICD-10-CM | POA: Diagnosis not present

## 2020-11-06 DIAGNOSIS — Q644 Malformation of urachus: Secondary | ICD-10-CM | POA: Diagnosis not present

## 2020-11-06 DIAGNOSIS — N3020 Other chronic cystitis without hematuria: Secondary | ICD-10-CM | POA: Diagnosis not present

## 2020-11-06 DIAGNOSIS — Z8759 Personal history of other complications of pregnancy, childbirth and the puerperium: Secondary | ICD-10-CM | POA: Insufficient documentation

## 2020-11-06 DIAGNOSIS — Z6837 Body mass index (BMI) 37.0-37.9, adult: Secondary | ICD-10-CM | POA: Insufficient documentation

## 2020-11-06 HISTORY — DX: Malformation of urachus: Q64.4

## 2020-11-06 HISTORY — PX: CYSTOSCOPY W/ RETROGRADES: SHX1426

## 2020-11-06 HISTORY — DX: Personal history of other complications of pregnancy, childbirth and the puerperium: Z87.59

## 2020-11-06 HISTORY — PX: CYSTOSCOPY WITH BIOPSY: SHX5122

## 2020-11-06 LAB — POCT PREGNANCY, URINE: Preg Test, Ur: NEGATIVE

## 2020-11-06 LAB — POCT I-STAT, CHEM 8
BUN: 9 mg/dL (ref 6–20)
Calcium, Ion: 1.32 mmol/L (ref 1.15–1.40)
Chloride: 103 mmol/L (ref 98–111)
Creatinine, Ser: 0.7 mg/dL (ref 0.44–1.00)
Glucose, Bld: 101 mg/dL — ABNORMAL HIGH (ref 70–99)
HCT: 45 % (ref 36.0–46.0)
Hemoglobin: 15.3 g/dL — ABNORMAL HIGH (ref 12.0–15.0)
Potassium: 3.8 mmol/L (ref 3.5–5.1)
Sodium: 140 mmol/L (ref 135–145)
TCO2: 25 mmol/L (ref 22–32)

## 2020-11-06 SURGERY — CYSTOSCOPY, WITH BIOPSY
Anesthesia: General | Site: Renal

## 2020-11-06 MED ORDER — FENTANYL CITRATE (PF) 100 MCG/2ML IJ SOLN
INTRAMUSCULAR | Status: DC | PRN
  Administered 2020-11-06 (×2): 25 ug via INTRAVENOUS
  Administered 2020-11-06: 50 ug via INTRAVENOUS

## 2020-11-06 MED ORDER — LIDOCAINE HCL (PF) 2 % IJ SOLN
INTRAMUSCULAR | Status: AC
  Filled 2020-11-06: qty 5

## 2020-11-06 MED ORDER — LIDOCAINE 2% (20 MG/ML) 5 ML SYRINGE
INTRAMUSCULAR | Status: DC | PRN
  Administered 2020-11-06: 80 mg via INTRAVENOUS

## 2020-11-06 MED ORDER — LACTATED RINGERS IV SOLN: INTRAVENOUS | Status: DC

## 2020-11-06 MED ORDER — ONDANSETRON HCL 4 MG/2ML IJ SOLN
INTRAMUSCULAR | Status: AC
  Filled 2020-11-06: qty 2

## 2020-11-06 MED ORDER — MIDAZOLAM HCL 2 MG/2ML IJ SOLN
INTRAMUSCULAR | Status: AC
  Filled 2020-11-06: qty 2

## 2020-11-06 MED ORDER — TRAMADOL HCL 50 MG PO TABS: 50.0000 mg | ORAL_TABLET | Freq: Four times a day (QID) | ORAL | 0 refills | Status: AC | PRN

## 2020-11-06 MED ORDER — KETOROLAC TROMETHAMINE 30 MG/ML IJ SOLN
INTRAMUSCULAR | Status: DC | PRN
  Administered 2020-11-06: 30 mg via INTRAVENOUS

## 2020-11-06 MED ORDER — DEXAMETHASONE SODIUM PHOSPHATE 10 MG/ML IJ SOLN
INTRAMUSCULAR | Status: AC
  Filled 2020-11-06: qty 1

## 2020-11-06 MED ORDER — AMISULPRIDE (ANTIEMETIC) 5 MG/2ML IV SOLN: 10.0000 mg | Freq: Once | INTRAVENOUS | Status: DC | PRN

## 2020-11-06 MED ORDER — KETOROLAC TROMETHAMINE 30 MG/ML IJ SOLN
INTRAMUSCULAR | Status: AC
  Filled 2020-11-06: qty 1

## 2020-11-06 MED ORDER — GENTAMICIN SULFATE 40 MG/ML IJ SOLN
5.0000 mg/kg | INTRAVENOUS | Status: AC
  Administered 2020-11-06: 450 mg via INTRAVENOUS
  Filled 2020-11-06: qty 11.25

## 2020-11-06 MED ORDER — DEXAMETHASONE SODIUM PHOSPHATE 10 MG/ML IJ SOLN
INTRAMUSCULAR | Status: DC | PRN
  Administered 2020-11-06: 10 mg via INTRAVENOUS

## 2020-11-06 MED ORDER — IOHEXOL 300 MG/ML  SOLN
INTRAMUSCULAR | Status: DC | PRN
  Administered 2020-11-06: 14 mL via URETHRAL

## 2020-11-06 MED ORDER — FENTANYL CITRATE (PF) 100 MCG/2ML IJ SOLN
25.0000 ug | INTRAMUSCULAR | Status: DC | PRN
Start: 2020-11-06 — End: 2020-11-06

## 2020-11-06 MED ORDER — PROPOFOL 10 MG/ML IV BOLUS
INTRAVENOUS | Status: DC | PRN
  Administered 2020-11-06: 200 mg via INTRAVENOUS

## 2020-11-06 MED ORDER — ONDANSETRON HCL 4 MG/2ML IJ SOLN: 4.0000 mg | Freq: Once | INTRAMUSCULAR | Status: DC | PRN

## 2020-11-06 MED ORDER — SENNOSIDES-DOCUSATE SODIUM 8.6-50 MG PO TABS: 1.0000 | ORAL_TABLET | Freq: Two times a day (BID) | ORAL | 0 refills | Status: DC

## 2020-11-06 MED ORDER — OXYCODONE HCL 5 MG PO TABS: 5.0000 mg | ORAL_TABLET | Freq: Once | ORAL | Status: DC | PRN

## 2020-11-06 MED ORDER — FENTANYL CITRATE (PF) 100 MCG/2ML IJ SOLN
INTRAMUSCULAR | Status: AC
  Filled 2020-11-06: qty 2

## 2020-11-06 MED ORDER — MIDAZOLAM HCL 5 MG/5ML IJ SOLN
INTRAMUSCULAR | Status: DC | PRN
  Administered 2020-11-06: 2 mg via INTRAVENOUS

## 2020-11-06 MED ORDER — OXYCODONE HCL 5 MG/5ML PO SOLN: 5.0000 mg | Freq: Once | ORAL | Status: DC | PRN

## 2020-11-06 MED ORDER — ONDANSETRON HCL 4 MG/2ML IJ SOLN
INTRAMUSCULAR | Status: DC | PRN
  Administered 2020-11-06: 4 mg via INTRAVENOUS

## 2020-11-06 MED ORDER — SODIUM CHLORIDE 0.9 % IR SOLN
Status: DC | PRN
  Administered 2020-11-06: 1500 mL

## 2020-11-06 MED ORDER — PROPOFOL 10 MG/ML IV BOLUS
INTRAVENOUS | Status: AC
  Filled 2020-11-06: qty 20

## 2020-11-06 SURGICAL SUPPLY — 23 items
BAG DRAIN URO-CYSTO SKYTR STRL (DRAIN) ×3 IMPLANT
CATH INTERMIT  6FR 70CM (CATHETERS) ×3 IMPLANT
CATH ROBINSON RED A/P 14FR (CATHETERS) IMPLANT
CLOTH BEACON ORANGE TIMEOUT ST (SAFETY) ×3 IMPLANT
ELECT REM PT RETURN 9FT ADLT (ELECTROSURGICAL)
ELECTRODE REM PT RTRN 9FT ADLT (ELECTROSURGICAL) IMPLANT
GLOVE SURG ENC MOIS LTX SZ7.5 (GLOVE) ×3 IMPLANT
GOWN STRL REUS W/TWL LRG LVL3 (GOWN DISPOSABLE) ×9 IMPLANT
GUIDEWIRE ANG ZIPWIRE 038X150 (WIRE) IMPLANT
GUIDEWIRE STR DUAL SENSOR (WIRE) IMPLANT
IV NS 1000ML (IV SOLUTION)
IV NS 1000ML BAXH (IV SOLUTION) IMPLANT
IV NS IRRIG 3000ML ARTHROMATIC (IV SOLUTION) ×3 IMPLANT
KIT TURNOVER CYSTO (KITS) ×3 IMPLANT
MANIFOLD NEPTUNE II (INSTRUMENTS) ×3 IMPLANT
NDL SAFETY ECLIPSE 18X1.5 (NEEDLE) ×2 IMPLANT
NEEDLE HYPO 18GX1.5 SHARP (NEEDLE) ×1
NS IRRIG 500ML POUR BTL (IV SOLUTION) ×3 IMPLANT
PACK CYSTO (CUSTOM PROCEDURE TRAY) ×3 IMPLANT
SYR 10ML LL (SYRINGE) IMPLANT
SYR 20ML LL LF (SYRINGE) IMPLANT
TUBE CONNECTING 12X1/4 (SUCTIONS) ×3 IMPLANT
WATER STERILE IRR 3000ML UROMA (IV SOLUTION) IMPLANT

## 2020-11-06 NOTE — Op Note (Signed)
Anne Murillo, Anne Murillo MEDICAL RECORD NO: [id number] ACCOUNT NO: [id number] DATE OF BIRTH: [birth year] FACILITY: [facility] LOCATION: [facility] PHYSICIAN: Alexis Frock, MD  Operative Report   PREOPERATIVE DIAGNOSIS:  Bladder dome mass.  POSTOPERATIVE DIAGNOSIS:  Urachal Remnant.   PROCEDURE PERFORMED: 1.  Cystoscopy, bilateral retrograde pyelograms interpretation. 2.  Bladder biopsy, fulguration.  ESTIMATED BLOOD LOSS:  Nil.  COMPLICATIONS:  None.  SPECIMEN:  Bladder dome.  History of patent urachus.  FINDINGS:   1.  No evidence of intraluminal bladder dome mass. 2.  Unremarkable bilateral retrograde pyelograms.  INDICATIONS:  The patient is a pleasant 35 year old woman who was found incidentally on CT imaging to have a questionable bladder dome mass.  She also has an umbilical hernia.  This appears to be in continuity consistent with a urachal remnant.  She is  referred for evaluation.  Natural history was discussed including benign and malignant urachal remnants and recommended path of operative biopsy to rule out frank malignancy before final management strategy determined. The patient wished to proceed.  Informed consent was obtained and placed in the medical record.  DESCRIPTION OF THE PROCEDURE:  The patient is being herself, procedure being cystoscopy, bilateral retrogrades and bladder biopsies confirmed, procedure time-out was performed.  Intravenous antibiotics were administered, general LMA anesthesia induced.   The patient was placed in the low lithotomy position.  Sterile field was created, prepped and draped the patient's vagina, introitus and proximal thigh using iodine.  Cystourethroscopy was performed using 21-French rigid cystoscope with 30 degrees offset  lens.  Inspection of bladder revealed no diverticula, calcifications, papillary lesions, the bladder dome area was very carefully inspected and there was some tethering consistent with a known urachal remnant; however,  there is no evidence of  intraluminal neoplasia whatsoever.  This was quite fortunate.  Ureteral orifices were singleton bilaterally.  The left ureteral orifice was cannulated with a 6-French renal catheter and left pyelogram was obtained.  Left retrograde pyelogram demonstrated single left ureter, single system left kidney.  No filling defects or narrowing noted.  Similarly, right retrograde pyelogram was obtained.  Right retrograde pyelogram demonstrated a single right ureter, single system right kidney.  No filling defects or narrowing noted.  Next, using cold cup biopsy forceps, representative seromuscular samples were taken from the bladder dome and the area of  the urachal remnant.  These were set aside for permanent pathology.  The base of this area was fulgurated using coagulation current loop, which showed excellent hemostasis.  The area was once again inspected.  There was excellent hemostasis.  There was  no evidence of any bladder perforation.  We achieved the goals of the procedure today and sampling the area of bladder dome and urachus.  Procedure was then terminated.  The patient tolerated the procedure well, no immediate complications. The patient  was taken to postanesthesia care unit in stable condition.  Plan for discharge home.   PUS D: 11/06/2020 2:13:40 pm T: 11/06/2020 3:32:00 pm  JOB: [id number]/ [id number]

## 2020-11-06 NOTE — Discharge Instructions (Signed)
1 - You may have urinary urgency (bladder spasms) and bloody urine on / off for up to 2 weeks. This is normal.  2 - Call MD or go to ER for fever >102, severe pain / nausea / vomiting not relieved by medications, or acute change in medical status  Bladder Biopsy, Care After This sheet gives you information about how to care for yourself after your procedure. Your health care provider may also give you more specific instructions. If you have problems or questions, contact your health care provider. What can I expect after the procedure? After the procedure, it is common to have:  Mild pain in your bladder or kidney area during urination.  Minor burning during urination.  Small amounts of blood in your urine.  A sudden urge to urinate.  A need to urinate more often than usual. Follow these instructions at home: Medicines  Take over-the-counter and prescription medicines only as told by your health care provider.  If you were prescribed an antibiotic medicine, take it as told by your health care provider. Do not stop taking the antibiotic even if you start to feel better. Activity  Rest if told by your health care provider.  Do not drive for 24 hours if you received a medicine to help you relax (sedative) during your procedure. Ask your health care provider when it is safe for you to drive.  Return to your normal activities as told by your health care provider. Ask your health care provider what activities are safe for you. General instructions  Take a warm bath to relieve any burning sensations around your urethra.  Hold a warm, damp washcloth over the urethral area to ease pain.  It is up to you to get the results of your procedure. Ask your health care provider, or the department that is doing the procedure, when your results will be ready.  Keep all follow-up visits as told by your health care provider. This is important.   Contact a health care provider if:  You have a  fever.  Your symptoms do not improve within 24 hours, and you continue to have: ? Burning during urination. ? Increasing amounts of blood in your urine. ? Pain during urination. ? An urgent need to urinate. ? A need to urinate more often than usual. Get help right away if:  You have a lot of bleeding or more bleeding.  You have severe pain.  You are unable to urinate.  You have bright red blood in your urine.  You are passing blood clots in your urine.  You have a fever. Summary  After the procedure, it is common to have mild pain, burning with urination, and some blood.  Take medicines as told. If you were given antibiotics, finish all of it even if you start to feel better.  Rest after the procedure. Follow your health care provider's instructions for self care at home.  Contact a health care provider if your symptoms do not improve within 24 hours, or if you have more pain or more blood in your urine.  Get help right away if you have a lot of bleeding, severe pain, fever, or bright red blood or blood clots in the urine. This information is not intended to replace advice given to you by your health care provider. Make sure you discuss any questions you have with your health care provider. Document Revised: 03/08/2019 Document Reviewed: 03/08/2019 Elsevier Patient Education  [place]  Care Instructions  Activity: Get plenty of rest for the remainder of the day. A responsible individual must stay with you for 24 hours following the procedure.  For the next 24 hours, DO NOT: -Drive a car -Paediatric nurse -Drink alcoholic beverages -Take any medication unless instructed by your physician -Make any legal decisions or sign important papers.  Meals: Start with liquid foods such as gelatin or soup. Progress to regular foods as tolerated. Avoid greasy, spicy, heavy foods. If nausea and/or vomiting occur, drink only clear liquids until the  nausea and/or vomiting subsides. Call your physician if vomiting continues.  Special Instructions/Symptoms: Your throat may feel dry or sore from the anesthesia or the breathing tube placed in your throat during surgery. If this causes discomfort, gargle with warm salt water. The discomfort should disappear within 24 hours.  No ibuprofen, Advil, Aleve, Motrin, or naproxen until after 8:00 pm today if needed.

## 2020-11-06 NOTE — Transfer of Care (Signed)
Immediate Anesthesia Transfer of Care Note  Patient: Anne Murillo  Procedure(s) Performed: CYSTOSCOPY WITH BLADDER BIOPSY (N/A Bladder) CYSTOSCOPY WITH RETROGRADE PYELOGRAM (Bilateral Renal)  Patient Location: PACU  Anesthesia Type:General  Level of Consciousness: awake, alert  and oriented  Airway & Oxygen Therapy: Patient Spontanous Breathing and Patient connected to face mask oxygen  Post-op Assessment: Report given to RN and Post -op Vital signs reviewed and stable  Post vital signs: Reviewed and stable  Last Vitals:  Vitals Value Taken Time  BP 119/79 11/06/20 1418  Temp    Pulse 91 11/06/20 1419  Resp 22 11/06/20 1419  SpO2 100 % 11/06/20 1419  Vitals shown include unvalidated device data.  Last Pain:  Vitals:   11/06/20 1254  TempSrc: Oral  PainSc: 0-No pain      Patients Stated Pain Goal: 5 (28/78/67 6720)  Complications: No complications documented.

## 2020-11-06 NOTE — Anesthesia Postprocedure Evaluation (Signed)
Anesthesia Post Note  Patient: Anne Murillo  Procedure(s) Performed: CYSTOSCOPY WITH BLADDER BIOPSY (N/A Bladder) CYSTOSCOPY WITH RETROGRADE PYELOGRAM (Bilateral Renal)     Patient location during evaluation: PACU Anesthesia Type: General Level of consciousness: awake and alert Pain management: pain level controlled Vital Signs Assessment: post-procedure vital signs reviewed and stable Respiratory status: spontaneous breathing, nonlabored ventilation and respiratory function stable Cardiovascular status: blood pressure returned to baseline and stable Postop Assessment: no apparent nausea or vomiting Anesthetic complications: no   No complications documented.  Last Vitals:  Vitals:   11/06/20 1445 11/06/20 1505  BP: 118/81 114/80  Pulse: 63 66  Resp: 16 14  Temp: 36.8 C 36.6 C  SpO2: 100% 100%    Last Pain:  Vitals:   11/06/20 1505  TempSrc:   PainSc: 0-No pain                 Lidia Collum

## 2020-11-06 NOTE — H&P (Signed)
Anne Murillo is an 35 y.o. female.    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 No h/o umbilical drainage. No h/o mucosuria or hematuria. CT with substantial mass like appearance of bladder dome. No tissue sampling / biopsy yet. No pelvic adenopathy. Uterus and ovaries in situ. No ureteral contrast imaging as of yet.   PMH sig for obesity, C/S x 3 (may want future fertility). NO CV disease / blood thinners. She is busy mother of 3 yougn children, married, originally from [place] and native language [place], but speaks very good [place]. Her PCP is Designer, television/film set PA.   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 No interval fevers. Most recent UA without infectious parameters. C19 screen negative.    Past Medical History:  Diagnosis Date  . Bilateral ovarian cysts   . History of gestational hypertension   . Urachal cyst     Past Surgical History:  Procedure Laterality Date  . CESAREAN SECTION N/A 12/05/2013   Procedure: Primary Cesarean Section Delivery Baby Girl @ 0136, Apgars 9/9;  Surgeon: Osborne Oman, MD;  Location: [facility];  Service: Obstetrics;  Laterality: N/A;  . CESAREAN SECTION N/A 07/20/2016   Procedure: CESAREAN SECTION;  Surgeon: Truett Mainland, DO;  Location: [facility];  Service: Obstetrics;  Laterality: N/A;  . CESAREAN SECTION N/A 09/15/2019   Procedure: CESAREAN SECTION;  Surgeon: Sloan Leiter, MD;  [facility];  Service: Obstetrics;  Laterality: N/A;  . DILATION AND EVACUATION N/A 07/20/2015   Procedure: DILATATION AND EVACUATION;  Surgeon: Bobbye Charleston, MD;  Location: [facility];  Service: Gynecology;  Laterality: N/A;    History reviewed. No pertinent family history. Social  History:  reports that she has never smoked. She has never used smokeless tobacco. She reports that she does not drink alcohol and does not use drugs.  Allergies: No Known Allergies  No medications prior to admission.    Results for orders placed or performed during the hospital encounter of 11/04/20 (from the past 48 hour(s))  SARS CORONAVIRUS 2 (TAT 6-24 HRS) Nasopharyngeal Nasopharyngeal Swab     Status: None   Collection Time: 11/04/20 10:20 AM   Specimen: Nasopharyngeal Swab  Result Value Ref Range   SARS Coronavirus 2 NEGATIVE NEGATIVE    Comment: (NOTE) SARS-CoV-2 target nucleic acids are NOT DETECTED.  The SARS-CoV-2 RNA is generally detectable in upper and lower respiratory specimens during the acute phase of infection. Negative results do not preclude SARS-CoV-2 infection, do not rule out co-infections with other pathogens, and should not be used as the sole basis for treatment or other patient management decisions. Negative results must be combined with clinical observations, patient history, and epidemiological information. The expected result is Negative.  Fact Sheet for Patients: SugarRoll.be  Fact Sheet for Healthcare Providers: https://www.woods-mathews.com/  This test is not yet approved or cleared by the [place] FDA and  has been authorized for detection and/or diagnosis of SARS-CoV-2 by FDA under an Emergency Use Authorization (EUA). This EUA will remain  in effect (meaning this test can be used) for the duration of the COVID-19 declaration under Se ction 564(b)(1) of the Act, 21 U.S.C. section 360bbb-3(b)(1), unless the authorization is terminated or revoked  sooner.  Performed at [facility], [place] [address] [place], [place] [address]    No results found.  Review of Systems  Constitutional: Negative for chills and fatigue.  Genitourinary: Negative for hematuria.  All other systems reviewed and are  negative.   Height 5\' 3"  (1.6 m), weight 94.8 kg, currently breastfeeding. Physical Exam Vitals reviewed.  Constitutional:      Comments: Pleasant, at baseline.   HENT:     Head: Normocephalic.     Nose: Nose normal.     Mouth/Throat:     Mouth: Mucous membranes are moist.  Cardiovascular:     Rate and Rhythm: Normal rate.  Pulmonary:     Effort: Pulmonary effort is normal.  Abdominal:     General: Abdomen is flat.     Comments: Stable modeate truncal obesity.   Genitourinary:    Comments: No CVAT at present.  Musculoskeletal:        General: Normal range of motion.     Cervical back: Normal range of motion.  Skin:    General: Skin is warm.  Neurological:     General: No focal deficit present.     Mental Status: She is alert.      Assessment/Plan  Proceed as planned with cysto, bilateral retrogrades, bladder dome biopsy with goal of ruling out cancer in urachal remnant. Risks, benefits, alternatives, expected peri-op course discussed previously and reiterated today.   Alexis Frock, MD 11/06/2020, 8:36 AM

## 2020-11-06 NOTE — Anesthesia Procedure Notes (Signed)
Procedure Name: LMA Insertion Date/Time: 11/06/2020 1:54 PM Performed by: Genelle Bal, CRNA Pre-anesthesia Checklist: Patient identified, Emergency Drugs available, Suction available and Patient being monitored Patient Re-evaluated:Patient Re-evaluated prior to induction Oxygen Delivery Method: Circle system utilized Preoxygenation: Pre-oxygenation with 100% oxygen Induction Type: IV induction Ventilation: Mask ventilation without difficulty LMA: LMA inserted LMA Size: 4.0 Number of attempts: 1 Airway Equipment and Method: Bite block Placement Confirmation: positive ETCO2 Tube secured with: Tape Dental Injury: Teeth and Oropharynx as per pre-operative assessment

## 2020-11-06 NOTE — Brief Op Note (Signed)
11/06/2020  2:09 PM  PATIENT:  Anne Murillo  35 y.o. female  PRE-OPERATIVE DIAGNOSIS:  BLADDER DOME / URACHAL MASS  POST-OPERATIVE DIAGNOSIS:  BLADDER DOME / URACHAL MASS  PROCEDURE:  Procedure(s) with comments: CYSTOSCOPY WITH BLADDER BIOPSY (N/A) - 1 HR CYSTOSCOPY WITH RETROGRADE PYELOGRAM (Bilateral)  SURGEON:  Surgeon(s) and Role:    * Alexis Frock, MD - Primary  PHYSICIAN ASSISTANT:   ASSISTANTS: none   ANESTHESIA:   general  EBL:  minimal   BLOOD ADMINISTERED:none  DRAINS: none   LOCAL MEDICATIONS USED:  NONE  SPECIMEN:  Source of Specimen:  bladder dome, h/o patent urachus  DISPOSITION OF SPECIMEN:  PATHOLOGY  COUNTS:  YES  TOURNIQUET:  * No tourniquets in log *  DICTATION: .Other Dictation: Dictation Number [id number]  PLAN OF CARE: Discharge to home after PACU  PATIENT DISPOSITION:  PACU - hemodynamically stable.   Delay start of Pharmacological VTE agent (>24hrs) due to surgical blood loss or risk of bleeding: yes

## 2020-11-06 NOTE — Anesthesia Preprocedure Evaluation (Signed)
Anesthesia Evaluation  Patient identified by MRN, date of birth, ID band Patient awake    Reviewed: Allergy & Precautions, NPO status , Patient's Chart, lab work & pertinent test results  History of Anesthesia Complications Negative for: history of anesthetic complications  Airway Mallampati: II  TM Distance: >3 FB Neck ROM: Full    Dental  (+) Teeth Intact   Pulmonary neg pulmonary ROS,    Pulmonary exam normal        Cardiovascular negative cardio ROS Normal cardiovascular exam     Neuro/Psych negative neurological ROS  negative psych ROS   GI/Hepatic negative GI ROS, Neg liver ROS,   Endo/Other  negative endocrine ROS  Renal/GU negative Renal ROS   Bladder dome/urachal mass    Musculoskeletal negative musculoskeletal ROS (+)   Abdominal   Peds  Hematology negative hematology ROS (+)   Anesthesia Other Findings   Reproductive/Obstetrics negative OB ROS                            Anesthesia Physical Anesthesia Plan  ASA: II  Anesthesia Plan: General   Post-op Pain Management:    Induction: Intravenous  PONV Risk Score and Plan: 3 and Ondansetron, Dexamethasone, Midazolam and Treatment may vary due to age or medical condition  Airway Management Planned: LMA  Additional Equipment: None  Intra-op Plan:   Post-operative Plan: Extubation in OR  Informed Consent: I have reviewed the patients History and Physical, chart, labs and discussed the procedure including the risks, benefits and alternatives for the proposed anesthesia with the patient or authorized representative who has indicated his/her understanding and acceptance.     Dental advisory given  Plan Discussed with:   Anesthesia Plan Comments:         Anesthesia Quick Evaluation

## 2020-11-07 ENCOUNTER — Encounter (HOSPITAL_BASED_OUTPATIENT_CLINIC_OR_DEPARTMENT_OTHER): Payer: Self-pay | Admitting: Urology

## 2020-11-07 LAB — SURGICAL PATHOLOGY

## 2020-11-12 ENCOUNTER — Other Ambulatory Visit: Payer: Self-pay

## 2020-11-12 ENCOUNTER — Ambulatory Visit (INDEPENDENT_AMBULATORY_CARE_PROVIDER_SITE_OTHER): Payer: Medicaid Other | Admitting: Family Medicine

## 2020-11-12 ENCOUNTER — Encounter: Payer: Self-pay | Admitting: Family Medicine

## 2020-11-12 VITALS — BP 122/79 | HR 79 | Wt 206.9 lb

## 2020-11-12 DIAGNOSIS — Z01419 Encounter for gynecological examination (general) (routine) without abnormal findings: Secondary | ICD-10-CM

## 2020-11-12 DIAGNOSIS — Z975 Presence of (intrauterine) contraceptive device: Secondary | ICD-10-CM | POA: Diagnosis not present

## 2020-11-12 NOTE — Progress Notes (Signed)
GYNECOLOGY ANNUAL PREVENTATIVE CARE ENCOUNTER NOTE  History:     Anne Murillo is a 35 y.o. [phone] female here for a routine annual gynecologic exam.  Current complaints: none.   Denies abnormal vaginal bleeding, discharge, pelvic pain, problems with intercourse or other gynecologic concerns.    Gynecologic History Patient's last menstrual period was 07/20/2020 (approximate). Contraception: IUD in place Last Pap: 06/02/2019. Results were: normal with negative HPV Last mammogram: never given patient age and no family history  Obstetric History OB History  Gravida Para Term Preterm AB Living  4 3 3   1 3   SAB IAB Ectopic Multiple Live Births  1     0 3    # Outcome Date GA Lbr Len/2nd Weight Sex Delivery Anes PTL Lv  4 Term 09/15/19 [redacted]w[redacted]d  8 lb 10.3 oz (3.92 kg) F CS-LTranv Spinal  LIV  3 Term 07/20/16 [redacted]w[redacted]d  9 lb 0.1 oz (4.085 kg) F CS-LTranv Spinal  LIV  2 SAB 07/21/15 [redacted]w[redacted]d         1 Term 12/05/13 [redacted]w[redacted]d  8 lb 1.8 oz (3.68 kg) F CS-LTranv EPI  LIV    Obstetric Comments  11/2013 pLTCS for NRFHT in 1st stage due to lates. 3680gm    Past Medical History:  Diagnosis Date  . Bilateral ovarian cysts   . History of gestational hypertension   . Urachal cyst     Past Surgical History:  Procedure Laterality Date  . CESAREAN SECTION N/A 12/05/2013   Procedure: Primary Cesarean Section Delivery Baby Girl @ 0136, Apgars 9/9;  Surgeon: Osborne Oman, MD;  Location: [facility];  Service: Obstetrics;  Laterality: N/A;  . CESAREAN SECTION N/A 07/20/2016   Procedure: CESAREAN SECTION;  Surgeon: Truett Mainland, DO;  Location: [facility];  Service: Obstetrics;  Laterality: N/A;  . CESAREAN SECTION N/A 09/15/2019   Procedure: CESAREAN SECTION;  Surgeon: Sloan Leiter, MD;  [facility];  Service: Obstetrics;  Laterality: N/A;  . CYSTOSCOPY W/ RETROGRADES Bilateral 11/06/2020   Procedure: CYSTOSCOPY WITH RETROGRADE PYELOGRAM;  Surgeon: Alexis Frock, MD;  Location: [facility];  Service: Urology;  Laterality: Bilateral;  . CYSTOSCOPY WITH BIOPSY N/A 11/06/2020   Procedure: CYSTOSCOPY WITH BLADDER BIOPSY;  Surgeon: Alexis Frock, MD;  [facility];  Service: Urology;  Laterality: N/A;  1 HR  . DILATION AND EVACUATION N/A 07/20/2015   Procedure: DILATATION AND EVACUATION;  Surgeon: Bobbye Charleston, MD;  Location: [facility];  Service: Gynecology;  Laterality: N/A;    Current Outpatient Medications on File Prior to Visit  Medication Sig Dispense Refill  . traMADol (ULTRAM) 50 MG tablet Take 1-2 tablets (50-100 mg total) by mouth every 6 (six) hours as needed for moderate pain or severe pain. Post-operatively. (please label in [place]) 15 tablet 0  . senna-docusate (SENOKOT-S) 8.6-50 MG tablet Take 1 tablet by mouth 2 (two) times daily. While taking strong pain meds to prevent constipation. (please label in [place]) (Patient not taking: Reported on 11/12/2020) 10 tablet 0  . UNABLE TO FIND Med Name:iud x 1 year     No current facility-administered medications on file prior to visit.    No Known Allergies  Social History:  reports that she has never smoked. She has never used smokeless tobacco. She reports that she does not drink alcohol and does not use drugs.  History reviewed. No pertinent family history.  The following portions of the patient's history were reviewed and  updated as appropriate: allergies, current medications, past family history, past medical history, past social history, past surgical history and problem list.  Review of Systems Pertinent items noted in HPI and remainder of comprehensive ROS otherwise negative.  Physical Exam:  BP 122/79   Pulse 79   Wt 206 lb 14.4 oz (93.8 kg)   LMP 07/20/2020 (Approximate)   BMI 36.65 kg/m  CONSTITUTIONAL: Well-developed, well-nourished female in no acute distress.  HENT:  Normocephalic, atraumatic, External right and left ear normal. Oropharynx is clear and  moist EYES: Conjunctivae and EOM are normal. Pupils are equal, round, and reactive. No scleral icterus.  NECK: Normal range of motion, supple, no masses.  SKIN: Skin is warm and dry. No rash noted. Not diaphoretic. No erythema. No pallor. MUSCULOSKELETAL: Normal range of motion. No tenderness.  No cyanosis, clubbing, or edema.   NEUROLOGIC: Alert and oriented to person, place, and time. Normal muscle tone coordination.  PSYCHIATRIC: Normal mood and affect. Normal behavior. Normal judgment and thought content. CARDIOVASCULAR: Normal heart rate noted RESPIRATORY: Effort normal, no problems with respiration noted. BREASTS: Symmetric in size. No masses, tenderness, skin changes, nipple drainage, or lymphadenopathy bilaterally. Performed in the presence of a chaperone. ABDOMEN: Soft, no distention noted.  No tenderness, rebound or guarding.  PELVIC: not indicated   Assessment and Plan:    1. Encounter for annual routine gynecological examination Doing well without complaints IUD in place, strings lost per recent notes, placement confirmed on recent abdominal ct Follows with PCP, encouraged to follow up for lipid profile and a1c given BMI 36 No family history of uterine, ovarian, breast cancer, no screening mammogram recommended  Routine preventative health maintenance measures emphasized. Please refer to After Visit Summary for other counseling recommendations.      Arrie Senate, MD OB Fellow, [place] for [place] 11/12/2020 9:25 AM

## 2020-11-12 NOTE — Patient Instructions (Signed)
[place] [phone]) . [place] o [address] [place], [place] [address] o [phone] o Mon-Fri 8:30-12:30, 1:30-5:00 o Accepting Medicaid . [place] at [facility] [place], [place], [place] [address] o [phone] o Mon-Fri 8:00-5:30 . [place] [place], [place] [address] o [phone], Tue, Thur, Fri 8:30-5:00, Wed 10:00-7:00 (closed 1-2pm) o Accepting Medicaid . [facility] o [address], [place], [place]  [address] o Phone - [phone]   Fax - [phone]  [place] [phone]) . [place] [place], [place] [address] o [phone] o Mon-Fri 8:00-5:00 . [facility] at [facility])  o [address] [place], [place] [address] o [phone] o Mon-Fri 8:30-5:30, Sat (Oct.-Mar.) 9:00-1:00 o Accepting [facility] [phone]) . [place] at [place], [place], [place] [address] o [phone] o Mon-Fri 8:00-5:00  [place] [phone]) . [place] at [place], [place], [place] [address] o [phone] o Mon-Fri 8:00-5:00 . Therapist, music at [place], [place], [place] [address] o [phone] o Mon-Fri 8:00-5:00 . Therapist, music at [place] [place], [place] [address] o [phone] o Mon-Fri 8:00-5:00 . [place] [place] [address] o [phone] o Mon-Fri 7:30-5:30  [place] ([place]) . [place] [place], [place] [address] o [phone] o Mon-Thur 8:00-6:00 o Accepting Medicaid . [employer] [place], [place] [address] o [phone] o Mon-Thur 7:30-7:30, Fri 7:30-4:30 o Accepting Medicaid . [place] at [place] [address], [place], [place]  [address] o [phone]   Fax - [place] [phone] & [phone]) . Therapist, music at [place] [place], [place] [address] o [phone] o Mon-Fri 7:00-5:00 . [place], [place], [place] [address] o [phone] o Mon-Fri 8:00-5:00 o Accepting Medicaid . [place], [place], [place] [address] o [phone] o Mon-Fri 8:00-5:00 o Accepting Medicaid  [facility] [place] [phone]) . [facility] o [address] [place], [place] [address] o [phone] o Mon-Fri 8:00-5:00 . [place] ([place] at [employer]) o [address] [place], [place], [place] [address] o [phone] o Mon-Fri 8:00-5:00 o Accepting Medicaid . [place] ([facility] [employer]) o [address] [place], [place], [place] [address] o [phone] o Mon-Fri 8:00-5:30, Sat&Sun by appointment (phones open at 8:30) o Accepting [facility] [phone] & [phone]) . [facility] o [address] [place], [place] [address] o [phone] o Mon-Thur 8:00-7:00, Fri 8:00-5:00, Sat 8:00-12:00, Sun 9:00-12:00 o Accepting Medicaid . [facility] at [facility] [address], [place], [place] [address] o [phone] o Mon-Thur 8:00-5:00 o Accepting Medicaid . [facility] at [place]., [place],  [address] o [phone] o Mon-Fri 8:00-5:30, Sat (Oct.-Mar.) 9:00-1:00 o Accepting [employer]  [phone]) .  [place] o [address] [place], [place], [place] [address] o [phone] o Mon-Fri 8:00-5:00 o Accepting Medicaid   [facility] [phone]) . [place] at [place], [place], [place] [address] o [phone] o Mon-Fri 8:00-5:00 . Therapist, music at [facility] o [address], [place], [place] [address] o [phone] o Mon-Fri 8:00-5:00 . [place] [address], [place], [place] [address] o [phone] o Mon-Fri 8:00-5:00 o After hours clinic [facility] [place], [place] [address]) [phone] Mon-Fri 5:00-8:00, Sat 12:00-6:00, Sun 10:00-4:00 o Accepting Medicaid . [place] at [facility] o [address], [place], [place]  [address] o [phone]   Fax - [phone]  [place] [phone]) . Therapist, music at [place] o [address] [place], [place] [address] o [phone] o Mon-Fri 8:00-5:00 . [facility] [place]) o [place] [address], [place], [place] [address] o [phone] o Mon-Thur 8:00-7:00, Fri 8:00-5:00, Sat 8:00-12:00

## 2020-11-12 NOTE — Progress Notes (Signed)
Went to the emergency room Sep 13, 2020 because of stomach pains. She was told that there was blood clots in her stomach. Pain all around in stomach but more severe in umbilical down to pelvic area. Blood was drawn at [facility] Nov 06 2020.

## 2020-11-19 DIAGNOSIS — K420 Umbilical hernia with obstruction, without gangrene: Secondary | ICD-10-CM | POA: Diagnosis not present

## 2020-11-19 DIAGNOSIS — Q644 Malformation of urachus: Secondary | ICD-10-CM | POA: Diagnosis not present

## 2021-01-06 ENCOUNTER — Ambulatory Visit: Payer: Self-pay | Admitting: Surgery

## 2021-01-06 ENCOUNTER — Encounter: Payer: Self-pay | Admitting: Surgery

## 2021-01-06 DIAGNOSIS — Q644 Malformation of urachus: Secondary | ICD-10-CM | POA: Diagnosis not present

## 2021-01-06 DIAGNOSIS — Z789 Other specified health status: Secondary | ICD-10-CM | POA: Diagnosis not present

## 2021-01-06 DIAGNOSIS — M6208 Separation of muscle (nontraumatic), other site: Secondary | ICD-10-CM | POA: Diagnosis not present

## 2021-01-06 DIAGNOSIS — K429 Umbilical hernia without obstruction or gangrene: Secondary | ICD-10-CM | POA: Diagnosis not present

## 2021-01-06 DIAGNOSIS — E669 Obesity, unspecified: Secondary | ICD-10-CM | POA: Diagnosis not present

## 2021-01-06 NOTE — H&P (Signed)
Anne Murillo Appointment: 01-11-21 9:30 AM Location: [facility] Patient #: [id number] DOB: Jun 13, 1986 Married / Language: [place] / Race: Black or African American Female  History of Present Illness Anne Hector MD; January 11, 2021 3:10 PM) The patient is a 35 year old female who presents with an abdominal wall hernia. Note for "Abdominal hernia": ` ` ` Patient sent for surgical consultation at the request of Anne Murillo, [facility]  Chief Complaint: Periumbilical hernia with need for resection of urachal cyst. ` ` The patient is a pleasant obese woman originally from [place]. Primarily speaks [place]. Telephone interpreter used since she was by herself. Husband apparently is bilingual. Patient had some intermittent crampy abdominal pain noticed drainage around her bellybutton about a year ago. Intermittent urinary tract infections. Underwent CAT scan and found a mass on the dome of the bladder. Sent to urology. History and examination suspicious for chronic urachal cyst. He recommended surgical resection. Moderate umbilical hernia noted. Dr. Bess Murillo had talked with me in the or lounge and sent the patient my way for surgical evaluation of possible combined case. Patient does struggle with chronic constipation, moving her bowels every 4-5 days. She does not smoke. She had a C-section but no other abdominal surgery. She is not diabetic. She does not smoke. A bowel regimen to try and get her bowels moving more easily.  (Review of systems as stated in this history (HPI) or in the review of systems. Otherwise all other 12 point ROS are negative) ` ` ###########################################`  This patient encounter took 40 minutes today to perform the following: obtain history, perform exam, review outside records, interpret tests & imaging, counsel the patient on their diagnosis; and, document this encounter, including findings & plan in the electronic  health record (EHR). Use of [place] phone interpreter used to help discuss findings, recommendations, answer questions, etc.   Past Surgical History Anne Murillo, [place]; 01/11/21 9:35 AM) Cesarean Section - Multiple  Diagnostic Studies History Anne Murillo, [place]; 01-11-2021 9:35 AM) Colonoscopy never Mammogram within last year Pap Smear 1-5 years ago  Allergies Anne Murillo, CMA; 01-11-21 9:35 AM) No Known Drug Allergies [01/11/2021]: Allergies Reconciled  Medication History Anne Murillo, CMA; Jan 11, 2021 9:36 AM) traMADol HCl (50MG  Tablet, Oral) Active. Cephalexin (500MG  Capsule, Oral) Active. Erythromycin (5MG /GM Ointment, Ophthalmic) Active. metroNIDAZOLE (500MG  Tablet, Oral) Active. Phenazopyridine HCl (200MG  Tablet, Oral) Active. Ibuprofen (600MG  Tablet, Oral) Active. Medications Reconciled  Social History Anne Murillo, [place]; 01-11-2021 9:35 AM) Caffeine use Tea. No alcohol use No drug use Tobacco use Never smoker.  Family History Anne Murillo, [place]; 2021-01-11 9:35 AM) Family history unknown First Degree Relatives  Pregnancy / Birth History Anne Murillo, [place]; 2021-01-11 9:35 AM) Age at menarche 65 years. Contraceptive History Intrauterine device. Gravida 4 Irregular periods Length (months) of breastfeeding 12-24 Maternal age 21-30  Other Problems Anne Murillo, [place]; January 11, 2021 9:35 AM) No pertinent past medical history     Review of Systems Anne Murillo CMA; January 11, 2021 9:35 AM) General Present- Fatigue and Weight Gain. Not Present- Appetite Loss, Chills, Fever, Night Sweats and Weight Loss. HEENT Not Present- Earache, Hearing Loss, Hoarseness, Nose Bleed, Oral Ulcers, Ringing in the Ears, Seasonal Allergies, Sinus Pain, Sore Throat, Visual Disturbances, Wears glasses/contact lenses and Yellow Eyes. Respiratory Not Present- Bloody sputum, Chronic Cough, Difficulty Breathing, Snoring and Wheezing. Breast Not Present- Breast Mass, Breast Pain, Nipple Discharge and  Skin Changes. Cardiovascular Not Present- Chest Pain, Difficulty Breathing Lying Down, Leg Cramps, Palpitations, Rapid Heart Rate, Shortness of Breath and Swelling  of Extremities. Gastrointestinal Present- Abdominal Pain and Constipation. Not Present- Bloating, Bloody Stool, Change in Bowel Habits, Chronic diarrhea, Difficulty Swallowing, Excessive gas, Gets full quickly at meals, Hemorrhoids, Indigestion, Nausea, Rectal Pain and Vomiting. Female Genitourinary Not Present- Frequency, Nocturia, Painful Urination, Pelvic Pain and Urgency. Musculoskeletal Present- Back Pain. Not Present- Joint Pain, Joint Stiffness, Muscle Pain, Muscle Weakness and Swelling of Extremities. Psychiatric Not Present- Anxiety, Bipolar, Change in Sleep Pattern, Depression, Fearful and Frequent crying. Endocrine Not Present- Cold Intolerance, Excessive Hunger, Hair Changes, Heat Intolerance, Hot flashes and New Diabetes. Hematology Not Present- Blood Thinners, Easy Bruising, Excessive bleeding, Gland problems, HIV and Persistent Infections.  Vitals Anne Murillo CMA; 01/06/2021 9:36 AM) 01/06/2021 9:36 AM Weight: 212.13 lb Height: 63in Body Surface Area: 1.98 m Body Mass Index: 37.58 kg/m  Temp.: 97.90F  Pulse: 97 (Regular)  P.OX: 99% (Room air) BP: 122/60(Sitting, Left Arm, Standard)        Physical Exam Anne Hector MD; 01/06/2021 10:16 AM)  General Mental Status-Alert. General Appearance-Not in acute distress, Not Sickly. Orientation-Oriented X3. Hydration-Well hydrated. Voice-Normal.  Integumentary Global Assessment Upon inspection and palpation of skin surfaces of the - Axillae: non-tender, no inflammation or ulceration, no drainage. and Distribution of scalp and body hair is normal. General Characteristics Temperature - normal warmth is noted.  Head and Neck Head-normocephalic, atraumatic with no lesions or palpable masses. Face Global Assessment - atraumatic, no absence  of expression. Neck Global Assessment - no abnormal movements, no bruit auscultated on the right, no bruit auscultated on the left, no decreased range of motion, non-tender. Trachea-midline. Thyroid Gland Characteristics - non-tender.  Eye Eyeball - Left-Extraocular movements intact, No Nystagmus - Left. Eyeball - Right-Extraocular movements intact, No Nystagmus - Right. Cornea - Left-No Hazy - Left. Cornea - Right-No Hazy - Right. Sclera/Conjunctiva - Left-No scleral icterus, No Discharge - Left. Sclera/Conjunctiva - Right-No scleral icterus, No Discharge - Right. Pupil - Left-Direct reaction to light normal. Pupil - Right-Direct reaction to light normal.  ENMT Ears Pinna - Left - no drainage observed, no generalized tenderness observed. Pinna - Right - no drainage observed, no generalized tenderness observed. Nose and Sinuses External Inspection of the Nose - no destructive lesion observed. Inspection of the nares - Left - quiet respiration. Inspection of the nares - Right - quiet respiration. Mouth and Throat Lips - Upper Lip - no fissures observed, no pallor noted. Lower Lip - no fissures observed, no pallor noted. Nasopharynx - no discharge present. Oral Cavity/Oropharynx - Tongue - no dryness observed. Oral Mucosa - no cyanosis observed. Hypopharynx - no evidence of airway distress observed.  Chest and Lung Exam Inspection Movements - Normal and Symmetrical. Accessory muscles - No use of accessory muscles in breathing. Palpation Palpation of the chest reveals - Non-tender. Auscultation Breath sounds - Normal and Clear.  Cardiovascular Auscultation Rhythm - Regular. Murmurs & Other Heart Sounds - Auscultation of the heart reveals - No Murmurs and No Systolic Clicks.  Abdomen Inspection Inspection of the abdomen reveals - No Visible peristalsis and No Abnormal pulsations. Umbilicus - No Bleeding, No Urine drainage. Palpation/Percussion Palpation and  Percussion of the abdomen reveal - Soft, Non Tender, No Rebound tenderness, No Rigidity (guarding) and No Cutaneous hyperesthesia. Note: Abdomen obese but soft. Moderate diastases recti. 4 x 4 cm periumbilical swelling consistent with hernia. Some crusting and irritation most likely spot of chronic urachal cyst. No active infection or major drainage today.  Female Genitourinary Sexual Maturity Tanner 5 - Adult hair pattern. Note:  Well-healed Pfannenstiel incision. No obvious inguinal hernias or lymphadenopathy. No vaginal bleeding nor discharge  Peripheral Vascular Upper Extremity Inspection - Left - No Cyanotic nailbeds - Left, Not Ischemic. Inspection - Right - No Cyanotic nailbeds - Right, Not Ischemic.  Neurologic Neurologic evaluation reveals -normal attention span and ability to concentrate, able to name objects and repeat phrases. Appropriate fund of knowledge , normal sensation and normal coordination. Mental Status Affect - not angry, not paranoid. Cranial Nerves-Normal Bilaterally. Gait-Normal.  Neuropsychiatric Mental status exam performed with findings of-able to articulate well with normal speech/language, rate, volume and coherence, thought content normal with ability to perform basic computations and apply abstract reasoning and no evidence of hallucinations, delusions, obsessions or homicidal/suicidal ideation.  Musculoskeletal Global Assessment Spine, Ribs and Pelvis - no instability, subluxation or laxity. Right Upper Extremity - no instability, subluxation or laxity.  Lymphatic Head & Neck  General Head & Neck Lymphatics: Bilateral - Description - No Localized lymphadenopathy. Axillary  General Axillary Region: Bilateral - Description - No Localized lymphadenopathy. Femoral & Inguinal  Generalized Femoral & Inguinal Lymphatics: Left - Description - No Localized lymphadenopathy. Right - Description - No Localized lymphadenopathy.    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  Recommendation made for excision of urachal cyst through her umbilical hernia. It is not particularly large.  Then would plan abdominal wall reconstruction. Given her obesity with diastases recti moderate hernia, most likely will need some type of mesh reinforcement. I would lean towards using Phasix absorbable mesh to decrease chance of infection but hopefully do a better job and suture repair only in minimizing recurrence. Discuss with my partner, Dr. Rosendo Gros, who agrees.  I had a long discussion with the patient with the help of a [place] interpreter since that is her primary language. Her husband is not here today but he speaks [place] and [place]. She seemed to understand most English rather well.  She wishes to do this after her family trip to [place] this summer. Most likely needing surgery in September. I will try and help coordinate with her urologist, Dr. Phebe Murillo.  Current Plans Instructed the patient to make an appointment for a follow-up office visit after seeing the recommended specialist. Pt Education - [facility] Free Text Education/Instructions: discussed with patient and provided information.  UMBILICAL HERNIA WITHOUT OBSTRUCTION AND WITHOUT GANGRENE (K42.9) Impression: Periumbilical hernia with urachal cyst. We'll plan repair with less likely absorbable mesh reinforcement to lower recurrence rate without increased infection risk.   DIASTASIS RECTI (M62.08)  Current Plans Pt Education - [facility] Diastasis Recti: discussed with patient and provided information.  PREOP - [place] - ENCOUNTER FOR PREOPERATIVE EXAMINATION FOR GENERAL SURGICAL PROCEDURE (Z01.818)  Current Plans You are being scheduled for surgery- Our schedulers will call you.  You should hear from our office's scheduling department within 5 working days about the location,  date, and time of surgery. We try to make accommodations for patient's preferences in scheduling surgery, but sometimes the OR schedule or the surgeon's schedule prevents [place] from making those accommodations.  If you have not heard from our office [phone]) in 5 working days, call the office and ask for your surgeon's nurse.  If you have other questions about your diagnosis, plan, or surgery, call the office and ask for your surgeon's nurse.  Written instructions provided [facility] Consent - Hernia Repair - Ventral/Incisional/Umbilical (Kavontae Pritchard): discussed with patient and provided  information. Pt Education - [facility] Hernia Post-Op HCI (Anjalina Bergevin): discussed with patient and provided information. Pt Education - [facility] Pain Control (Aitana Burry) Pt Education - Pamphlet Given - Laparoscopic Hernia Repair: discussed with patient and provided information. Pt Education - [facility] Mesh education: discussed with patient and provided information.  OBESITY (BMI 30-39.9) (E66.9)  Anne Hector, MD, FACS, MASCRS  Esophageal, Gastrointestinal & Colorectal Surgery Robotic and Minimally Invasive Surgery [facility] [address] [place], [place] [address] [phone] Fax [phone] Main/Paging  CONTACT INFORMATION: Weekday (9AM-5PM) concerns: Call [facility] main office at [phone] Weeknight (5PM-9AM) or Weekend/Holiday concerns: Check [email or web address] for General Surgery [facility] coverage (Please, do not use SecureChat as it is not reliable communication to operating surgeons for immediate patient care)

## 2021-01-07 DIAGNOSIS — K420 Umbilical hernia with obstruction, without gangrene: Secondary | ICD-10-CM | POA: Diagnosis not present

## 2021-01-07 DIAGNOSIS — Q644 Malformation of urachus: Secondary | ICD-10-CM | POA: Diagnosis not present

## 2021-03-04 DIAGNOSIS — Z03818 Encounter for observation for suspected exposure to other biological agents ruled out: Secondary | ICD-10-CM | POA: Diagnosis not present

## 2021-06-17 DIAGNOSIS — K420 Umbilical hernia with obstruction, without gangrene: Secondary | ICD-10-CM | POA: Diagnosis not present

## 2021-06-17 DIAGNOSIS — Q644 Malformation of urachus: Secondary | ICD-10-CM | POA: Diagnosis not present

## 2021-12-13 IMAGING — CT CT ABD-PELV W/ CM
2 of 4 series · 16 of 46 positions shown, 18 images · IV contrast (Omni 300)
Comparison: None.

CLINICAL DATA: Right lower quadrant abdomen pain.

EXAM:
CT ABDOMEN AND PELVIS WITH CONTRAST
TECHNIQUE: Multidetector CT imaging of the abdomen and pelvis was performed
using the standard protocol following bolus administration of
intravenous contrast.
CONTRAST:  100mL OMNIPAQUE IOHEXOL 300 MG/ML  SOLN

[Series 3: a/p w/ 5mm · axial · 0.98mm/px · z∈[+871,+1361]mm · 13 of 108 slices shown, 15 images]
[im 5/108  soft-tissue]
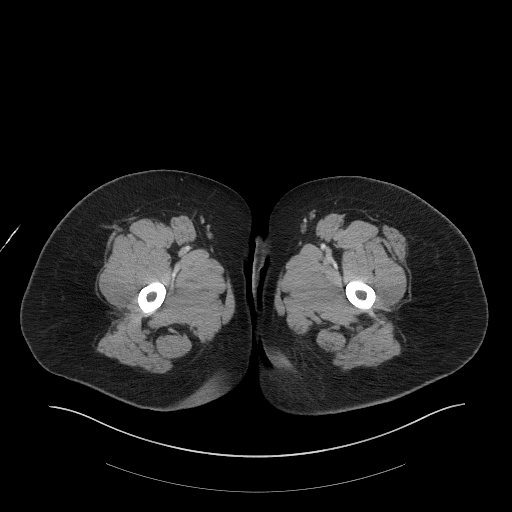
[im 5/108  bone]
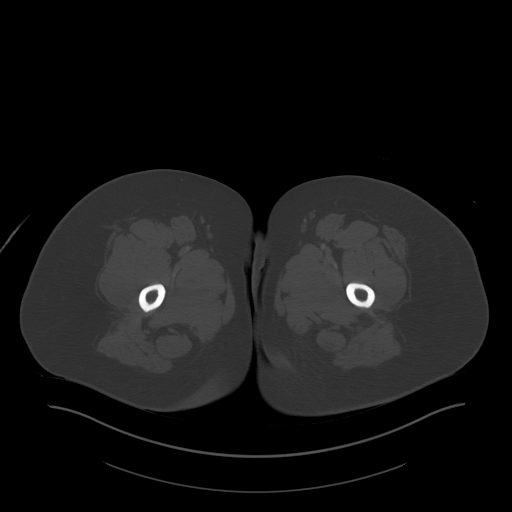
[im 14/108  soft-tissue]
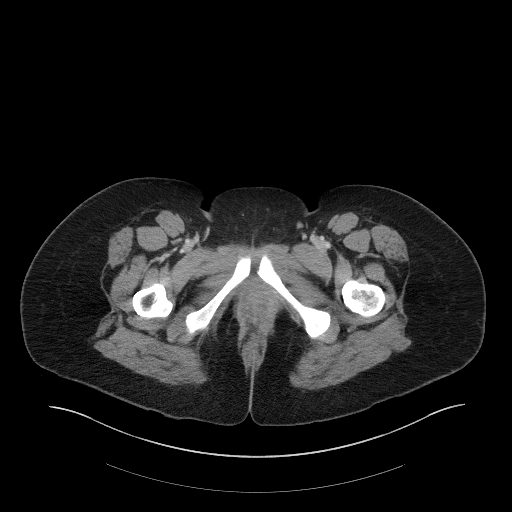
[im 24/108  soft-tissue]
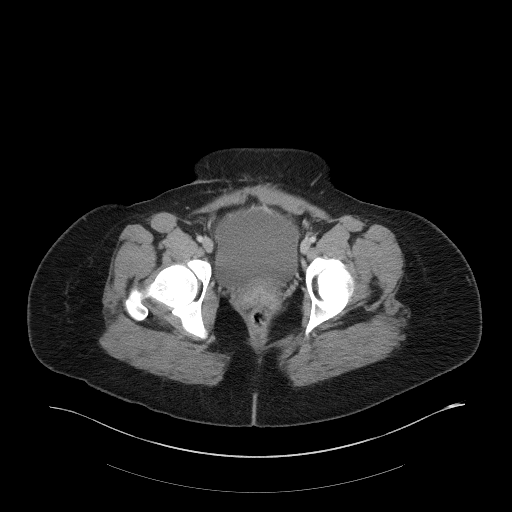
[im 28/108  soft-tissue]
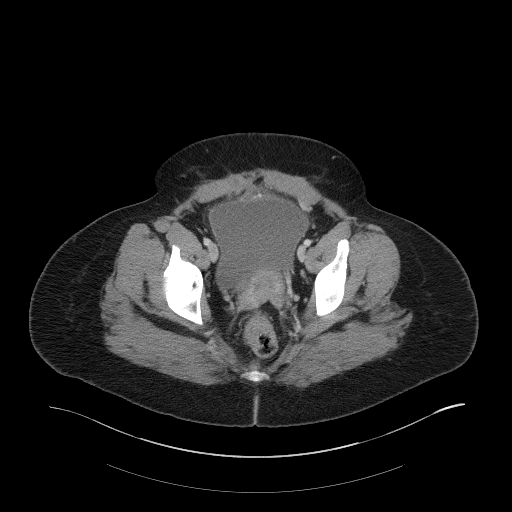
[im 38/108  soft-tissue]
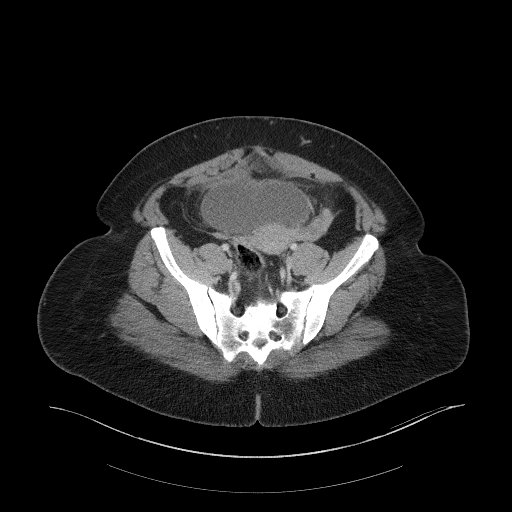
[im 47/108  soft-tissue]
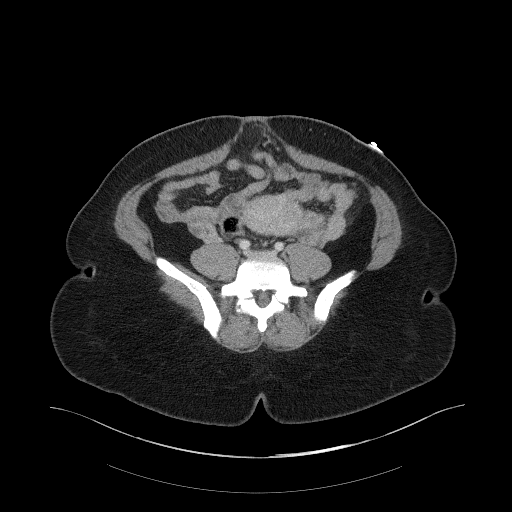
[im 56/108  soft-tissue]
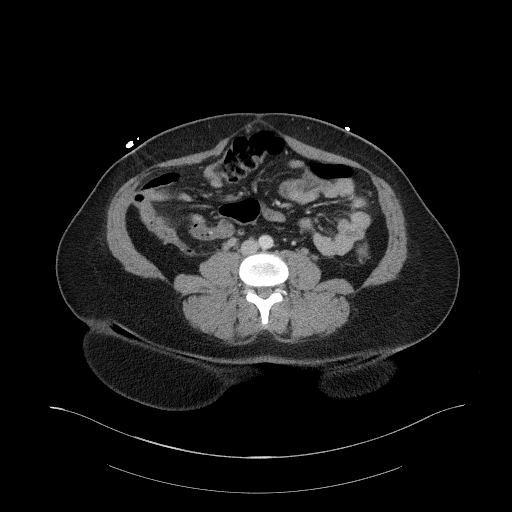
[im 61/108  soft-tissue]
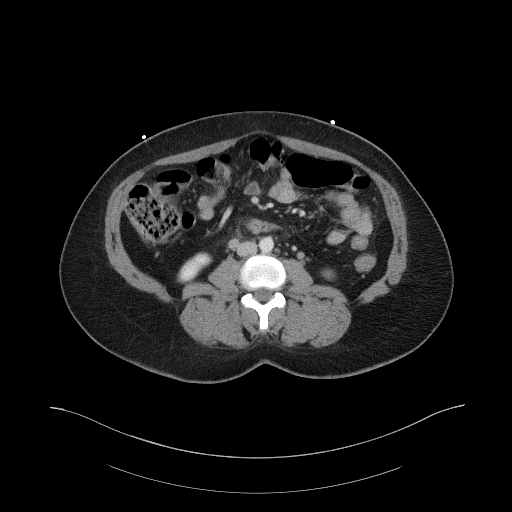
[im 70/108  soft-tissue]
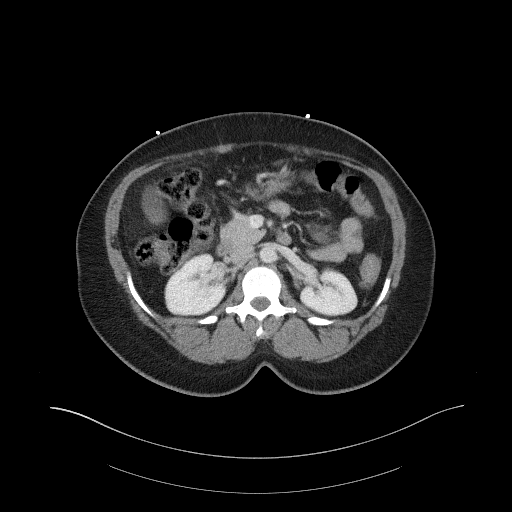
[im 70/108  bone]
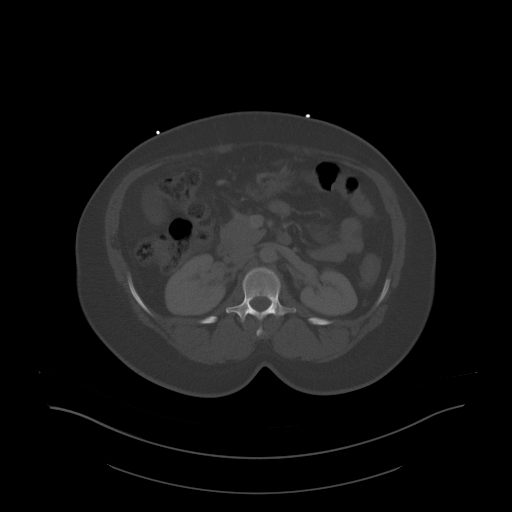
[im 80/108  soft-tissue]
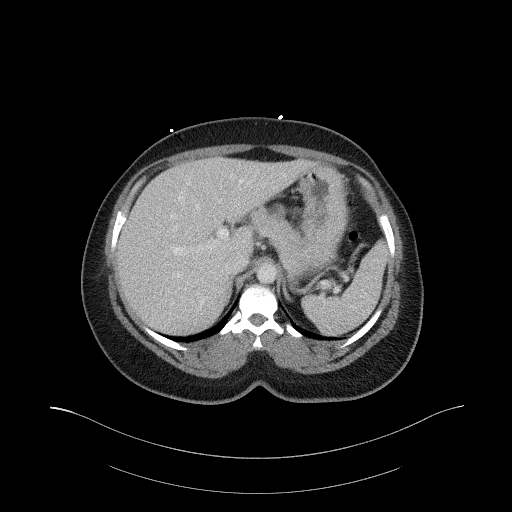
[im 84/108  soft-tissue]
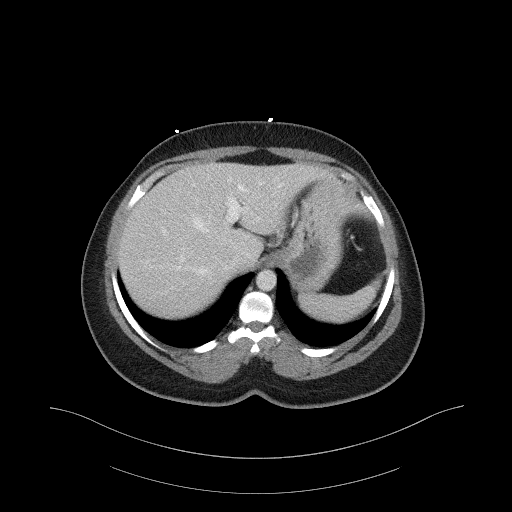
[im 94/108  soft-tissue]
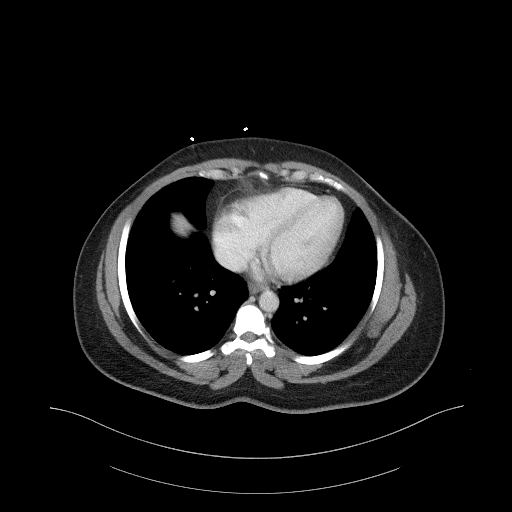
[im 103/108  soft-tissue]
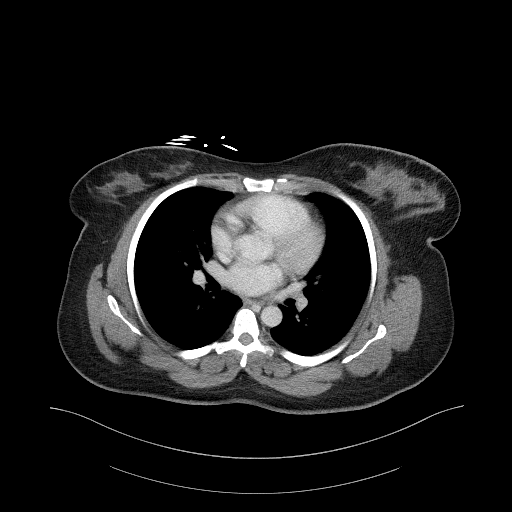

[Series 6: a/p w/ cor · coronal · 1.00mm/px · 3 of 171 slices shown]
[im 57/171  soft-tissue]
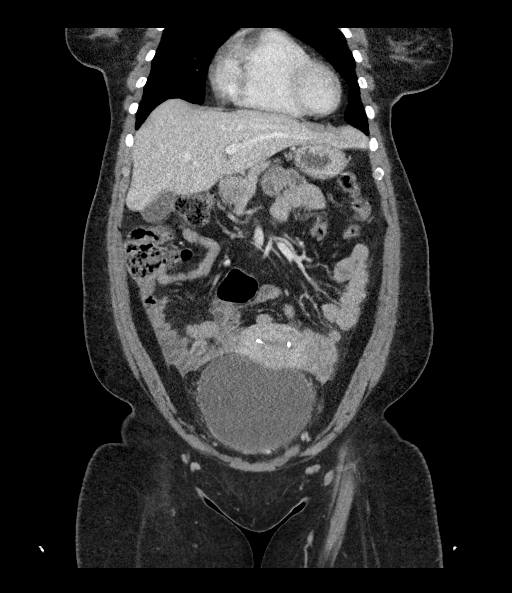
[im 76/171  soft-tissue]
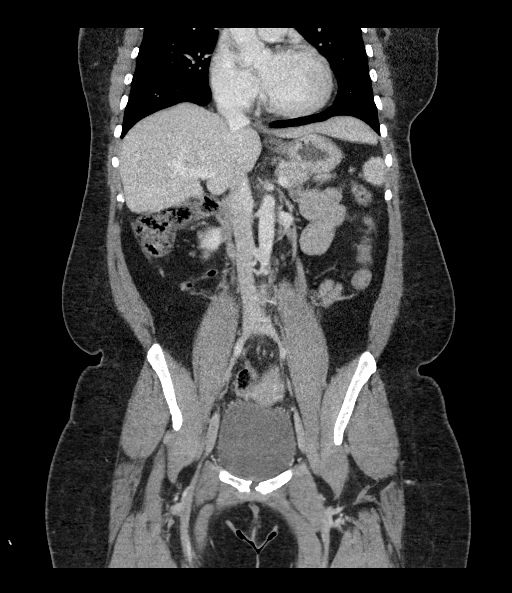
[im 95/171  soft-tissue]
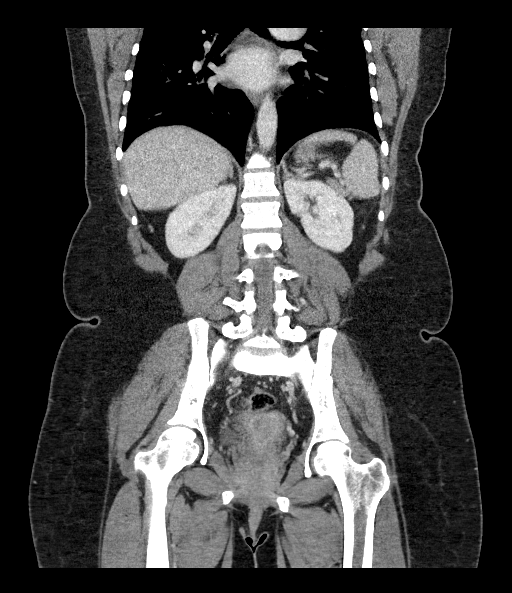

[16 of 46 positions shown; findings below may reference images not displayed]

FINDINGS: Lower chest: No acute abnormality.

Hepatobiliary: No focal liver abnormality is seen. No gallstones,
gallbladder wall thickening, or biliary dilatation.

Pancreas: Unremarkable. No pancreatic ductal dilatation or
surrounding inflammatory changes.

Spleen: Normal in size without focal abnormality.

Adrenals/Urinary Tract: Adrenal glands are unremarkable. Kidneys are
normal, without renal calculi, focal lesion, or hydronephrosis.
Bladder is unremarkable.

Stomach/Bowel: There is masslike opacity anterior to bladder
inseparable from small bowel loops in the right lower quadrant.
Surrounding fluid and inflammation is identified around the masslike
area/small bowel loops and anterior bladder. The appendix is normal.
Colon is normal. The stomach is normal.

Vascular/Lymphatic: No significant vascular findings are present. No
enlarged abdominal or pelvic lymph nodes.

Reproductive: Uterus and bilateral adnexa are unremarkable. IUD is
identified in the uterus.

Other: Broad-based ventral herniation of mesenteric fat and bowel
loops are identified in the umbilicus.

Musculoskeletal: No acute or significant osseous findings.
IMPRESSION: 1. Masslike opacity anterior to bladder inseparable from small bowel
loops in the right lower quadrant. Surrounding fluid and
inflammation is identified around the masslike area/small bowel
loops and anterior bladder. The findings are nonspecific but favor
infectious/inflammatory etiology, possibly in urachal remnant.
Follow-up after treatment is recommended to ensure resolution and to
ensure no underlying mass is present.
2. Normal appendix.
3. Broad-based ventral herniation of mesenteric fat and bowel loops
in the umbilicus.

## 2022-02-13 NOTE — Progress Notes (Signed)
    GYNECOLOGY OFFICE PROCEDURE NOTE  Anne Murillo is a 36 y.o. [id number] here for IUD removal. No GYN concerns.  Last pap smear was on 05/2019 and was normal with HPV negative.   IUD Removal  Patient identified, informed consent performed, consent signed.  Patient was in the dorsal lithotomy position, normal external genitalia was noted.  A speculum was placed in the patient's vagina, normal discharge was noted, no lesions. The cervix was visualized, no lesions, no abnormal discharge.  Pap brush used. No strings seen. Long kelly did not fit into os so os finders used. Then pap brush used once more. The strings of the IUD were grasped and pulled using the long kelly. The IUD was removed in its entirety.   Patient tolerated the procedure well.    Patient plans for pregnancy soon and she was told to avoid teratogens, take PNV and folic acid.  Routine preventative health maintenance measures emphasized.   Radene Gunning, MD, [place] for [facility], [place]

## 2022-02-18 ENCOUNTER — Encounter: Payer: Self-pay | Admitting: Obstetrics and Gynecology

## 2022-02-18 ENCOUNTER — Ambulatory Visit (INDEPENDENT_AMBULATORY_CARE_PROVIDER_SITE_OTHER): Payer: Medicaid Other | Admitting: Obstetrics and Gynecology

## 2022-02-18 VITALS — BP 119/79 | HR 63 | Wt 209.5 lb

## 2022-02-18 DIAGNOSIS — Z30432 Encounter for removal of intrauterine contraceptive device: Secondary | ICD-10-CM

## 2022-02-18 DIAGNOSIS — Z789 Other specified health status: Secondary | ICD-10-CM

## 2022-02-18 MED ORDER — VITAFOL GUMMIES 3.33-0.333-34.8 MG PO CHEW
3.0000 | CHEWABLE_TABLET | Freq: Every day | ORAL | 5 refills | Status: DC
Start: 2022-02-18 — End: 2023-05-03

## 2022-02-18 MED ORDER — VITAFOL GUMMIES 3.33-0.333-34.8 MG PO CHEW: 1.0000 | CHEWABLE_TABLET | Freq: Every day | ORAL | 5 refills | Status: DC

## 2022-02-18 NOTE — Progress Notes (Signed)
Patient here for IUD removal. She informed me that she wants it removed because she wants to get pregnant

## 2022-02-18 NOTE — Addendum Note (Signed)
Addended by: Langston Reusing on: 02/18/2022 09:18 AM   Modules accepted: Orders

## 2022-06-30 DIAGNOSIS — Q644 Malformation of urachus: Secondary | ICD-10-CM | POA: Diagnosis not present

## 2022-07-14 DIAGNOSIS — Q644 Malformation of urachus: Secondary | ICD-10-CM | POA: Diagnosis not present

## 2022-11-17 DIAGNOSIS — Z3009 Encounter for other general counseling and advice on contraception: Secondary | ICD-10-CM | POA: Diagnosis not present

## 2022-11-17 DIAGNOSIS — Z3201 Encounter for pregnancy test, result positive: Secondary | ICD-10-CM | POA: Diagnosis not present

## 2022-12-21 DIAGNOSIS — Z8679 Personal history of other diseases of the circulatory system: Secondary | ICD-10-CM | POA: Diagnosis not present

## 2022-12-21 DIAGNOSIS — Z31430 Encounter of female for testing for genetic disease carrier status for procreative management: Secondary | ICD-10-CM | POA: Diagnosis not present

## 2022-12-21 DIAGNOSIS — E669 Obesity, unspecified: Secondary | ICD-10-CM | POA: Diagnosis not present

## 2022-12-21 DIAGNOSIS — D259 Leiomyoma of uterus, unspecified: Secondary | ICD-10-CM | POA: Diagnosis not present

## 2022-12-21 DIAGNOSIS — O34211 Maternal care for low transverse scar from previous cesarean delivery: Secondary | ICD-10-CM | POA: Diagnosis not present

## 2022-12-21 DIAGNOSIS — E739 Lactose intolerance, unspecified: Secondary | ICD-10-CM | POA: Diagnosis not present

## 2022-12-21 DIAGNOSIS — O368999 Maternal care for other specified fetal problems, unspecified trimester, other fetus: Secondary | ICD-10-CM | POA: Diagnosis not present

## 2022-12-21 DIAGNOSIS — O09521 Supervision of elderly multigravida, first trimester: Secondary | ICD-10-CM | POA: Diagnosis not present

## 2022-12-21 DIAGNOSIS — Z3481 Encounter for supervision of other normal pregnancy, first trimester: Secondary | ICD-10-CM | POA: Diagnosis not present

## 2022-12-21 DIAGNOSIS — O99212 Obesity complicating pregnancy, second trimester: Secondary | ICD-10-CM | POA: Diagnosis not present

## 2022-12-21 LAB — OB RESULTS CONSOLE RUBELLA ANTIBODY, IGM: Rubella: IMMUNE

## 2022-12-21 LAB — OB RESULTS CONSOLE GC/CHLAMYDIA
Chlamydia: NEGATIVE
Neisseria Gonorrhea: NEGATIVE

## 2022-12-21 LAB — HEPATITIS C ANTIBODY: HCV Ab: NEGATIVE

## 2022-12-21 LAB — OB RESULTS CONSOLE HIV ANTIBODY (ROUTINE TESTING): HIV: NONREACTIVE

## 2022-12-21 LAB — OB RESULTS CONSOLE HEPATITIS B SURFACE ANTIGEN: Hepatitis B Surface Ag: NEGATIVE

## 2022-12-21 LAB — OB RESULTS CONSOLE ANTIBODY SCREEN: Antibody Screen: NEGATIVE

## 2022-12-21 LAB — OB RESULTS CONSOLE RPR: RPR: NONREACTIVE

## 2023-01-18 DIAGNOSIS — Z3481 Encounter for supervision of other normal pregnancy, first trimester: Secondary | ICD-10-CM | POA: Diagnosis not present

## 2023-02-15 DIAGNOSIS — O289 Unspecified abnormal findings on antenatal screening of mother: Secondary | ICD-10-CM | POA: Diagnosis not present

## 2023-02-15 DIAGNOSIS — O99212 Obesity complicating pregnancy, second trimester: Secondary | ICD-10-CM | POA: Diagnosis not present

## 2023-03-15 DIAGNOSIS — O99212 Obesity complicating pregnancy, second trimester: Secondary | ICD-10-CM | POA: Diagnosis not present

## 2023-04-06 DIAGNOSIS — O99212 Obesity complicating pregnancy, second trimester: Secondary | ICD-10-CM | POA: Diagnosis not present

## 2023-04-06 DIAGNOSIS — O289 Unspecified abnormal findings on antenatal screening of mother: Secondary | ICD-10-CM | POA: Diagnosis not present

## 2023-04-13 DIAGNOSIS — O09522 Supervision of elderly multigravida, second trimester: Secondary | ICD-10-CM | POA: Diagnosis not present

## 2023-04-13 DIAGNOSIS — O09521 Supervision of elderly multigravida, first trimester: Secondary | ICD-10-CM | POA: Diagnosis not present

## 2023-04-13 DIAGNOSIS — O34211 Maternal care for low transverse scar from previous cesarean delivery: Secondary | ICD-10-CM | POA: Diagnosis not present

## 2023-04-13 DIAGNOSIS — O368999 Maternal care for other specified fetal problems, unspecified trimester, other fetus: Secondary | ICD-10-CM | POA: Diagnosis not present

## 2023-04-13 DIAGNOSIS — N390 Urinary tract infection, site not specified: Secondary | ICD-10-CM | POA: Diagnosis not present

## 2023-04-13 DIAGNOSIS — Z3483 Encounter for supervision of other normal pregnancy, third trimester: Secondary | ICD-10-CM | POA: Diagnosis not present

## 2023-04-13 DIAGNOSIS — O34212 Maternal care for vertical scar from previous cesarean delivery: Secondary | ICD-10-CM | POA: Diagnosis not present

## 2023-04-13 DIAGNOSIS — D563 Thalassemia minor: Secondary | ICD-10-CM | POA: Diagnosis not present

## 2023-04-13 DIAGNOSIS — Z23 Encounter for immunization: Secondary | ICD-10-CM | POA: Diagnosis not present

## 2023-04-27 ENCOUNTER — Other Ambulatory Visit: Payer: Self-pay | Admitting: Obstetrics & Gynecology

## 2023-04-27 DIAGNOSIS — O09523 Supervision of elderly multigravida, third trimester: Secondary | ICD-10-CM

## 2023-04-27 DIAGNOSIS — O3660X1 Maternal care for excessive fetal growth, unspecified trimester, fetus 1: Secondary | ICD-10-CM

## 2023-04-27 DIAGNOSIS — Q674 Other congenital deformities of skull, face and jaw: Secondary | ICD-10-CM

## 2023-05-03 ENCOUNTER — Encounter: Payer: Self-pay | Admitting: *Deleted

## 2023-05-10 ENCOUNTER — Ambulatory Visit: Payer: Medicaid Other | Attending: Obstetrics & Gynecology | Admitting: *Deleted

## 2023-05-10 ENCOUNTER — Ambulatory Visit (HOSPITAL_BASED_OUTPATIENT_CLINIC_OR_DEPARTMENT_OTHER): Payer: Medicaid Other

## 2023-05-10 ENCOUNTER — Other Ambulatory Visit: Payer: Self-pay | Admitting: *Deleted

## 2023-05-10 ENCOUNTER — Encounter: Payer: Self-pay | Admitting: *Deleted

## 2023-05-10 VITALS — BP 131/66 | HR 103

## 2023-05-10 DIAGNOSIS — Z3A32 32 weeks gestation of pregnancy: Secondary | ICD-10-CM | POA: Insufficient documentation

## 2023-05-10 DIAGNOSIS — O09299 Supervision of pregnancy with other poor reproductive or obstetric history, unspecified trimester: Secondary | ICD-10-CM

## 2023-05-10 DIAGNOSIS — O09293 Supervision of pregnancy with other poor reproductive or obstetric history, third trimester: Secondary | ICD-10-CM

## 2023-05-10 DIAGNOSIS — E669 Obesity, unspecified: Secondary | ICD-10-CM

## 2023-05-10 DIAGNOSIS — O26893 Other specified pregnancy related conditions, third trimester: Secondary | ICD-10-CM | POA: Diagnosis not present

## 2023-05-10 DIAGNOSIS — O3663X0 Maternal care for excessive fetal growth, third trimester, not applicable or unspecified: Secondary | ICD-10-CM

## 2023-05-10 DIAGNOSIS — D563 Thalassemia minor: Secondary | ICD-10-CM

## 2023-05-10 DIAGNOSIS — Q674 Other congenital deformities of skull, face and jaw: Secondary | ICD-10-CM

## 2023-05-10 DIAGNOSIS — O3660X1 Maternal care for excessive fetal growth, unspecified trimester, fetus 1: Secondary | ICD-10-CM | POA: Insufficient documentation

## 2023-05-10 DIAGNOSIS — O99013 Anemia complicating pregnancy, third trimester: Secondary | ICD-10-CM

## 2023-05-10 DIAGNOSIS — Z36 Encounter for antenatal screening for chromosomal anomalies: Secondary | ICD-10-CM | POA: Insufficient documentation

## 2023-05-10 DIAGNOSIS — O34219 Maternal care for unspecified type scar from previous cesarean delivery: Secondary | ICD-10-CM | POA: Diagnosis not present

## 2023-05-10 DIAGNOSIS — O99213 Obesity complicating pregnancy, third trimester: Secondary | ICD-10-CM

## 2023-05-10 DIAGNOSIS — Z363 Encounter for antenatal screening for malformations: Secondary | ICD-10-CM | POA: Diagnosis not present

## 2023-05-10 DIAGNOSIS — O26843 Uterine size-date discrepancy, third trimester: Secondary | ICD-10-CM

## 2023-05-10 DIAGNOSIS — O09523 Supervision of elderly multigravida, third trimester: Secondary | ICD-10-CM | POA: Diagnosis not present

## 2023-06-08 DIAGNOSIS — B372 Candidiasis of skin and nail: Secondary | ICD-10-CM | POA: Diagnosis not present

## 2023-06-08 DIAGNOSIS — Z3483 Encounter for supervision of other normal pregnancy, third trimester: Secondary | ICD-10-CM | POA: Diagnosis not present

## 2023-06-08 DIAGNOSIS — Z789 Other specified health status: Secondary | ICD-10-CM | POA: Diagnosis not present

## 2023-06-08 LAB — OB RESULTS CONSOLE GBS: GBS: POSITIVE

## 2023-06-14 ENCOUNTER — Ambulatory Visit: Payer: Medicaid Other | Attending: Obstetrics

## 2023-06-14 ENCOUNTER — Other Ambulatory Visit: Payer: Self-pay | Admitting: Family Medicine

## 2023-06-14 ENCOUNTER — Other Ambulatory Visit: Payer: Self-pay | Admitting: Obstetrics and Gynecology

## 2023-06-14 DIAGNOSIS — O09293 Supervision of pregnancy with other poor reproductive or obstetric history, third trimester: Secondary | ICD-10-CM

## 2023-06-14 DIAGNOSIS — E669 Obesity, unspecified: Secondary | ICD-10-CM | POA: Diagnosis not present

## 2023-06-14 DIAGNOSIS — Z98891 History of uterine scar from previous surgery: Secondary | ICD-10-CM

## 2023-06-14 DIAGNOSIS — D563 Thalassemia minor: Secondary | ICD-10-CM | POA: Diagnosis not present

## 2023-06-14 DIAGNOSIS — O285 Abnormal chromosomal and genetic finding on antenatal screening of mother: Secondary | ICD-10-CM | POA: Diagnosis not present

## 2023-06-14 DIAGNOSIS — O99213 Obesity complicating pregnancy, third trimester: Secondary | ICD-10-CM

## 2023-06-14 DIAGNOSIS — O09523 Supervision of elderly multigravida, third trimester: Secondary | ICD-10-CM

## 2023-06-14 DIAGNOSIS — O34219 Maternal care for unspecified type scar from previous cesarean delivery: Secondary | ICD-10-CM

## 2023-06-14 DIAGNOSIS — Z3A37 37 weeks gestation of pregnancy: Secondary | ICD-10-CM

## 2023-06-14 DIAGNOSIS — O09299 Supervision of pregnancy with other poor reproductive or obstetric history, unspecified trimester: Secondary | ICD-10-CM | POA: Diagnosis present

## 2023-06-17 ENCOUNTER — Telehealth (HOSPITAL_COMMUNITY): Payer: Self-pay | Admitting: *Deleted

## 2023-06-17 NOTE — Patient Instructions (Addendum)
Anne Murillo  06/17/2023   Your procedure is scheduled on:  07/01/2023  Arrive at 1015 at Entrance C on [address] at [facility]  and [employer]. You are invited to use the FREE valet parking or use the Visitor's parking deck.  Pick up the phone at the desk and dial [phone].  Call this number if you have problems the morning of surgery: [phone]  Remember:   Do not eat food:(After Midnight) Desps de medianoche.  Do not drink clear liquids: (4 Hours before arrival) 4 horas ante llegada.  Take these medicines the morning of surgery with A SIP OF WATER:  none   Do not wear jewelry, make-up or nail polish.  Do not wear lotions, powders, or perfumes. Do not wear deodorant.  Do not shave 48 hours prior to surgery.  Do not bring valuables to the hospital.  [facility] is not   responsible for any belongings or valuables brought to the hospital.  Contacts, dentures or bridgework may not be worn into surgery.  Leave suitcase in the car. After surgery it may be brought to your room.  For patients admitted to the hospital, checkout time is 11:00 AM the day of              discharge.      Please read over the following fact sheets that you were given:     Preparing for Surgery

## 2023-06-17 NOTE — Telephone Encounter (Signed)
Preadmission screen  

## 2023-06-18 ENCOUNTER — Telehealth (HOSPITAL_COMMUNITY): Payer: Self-pay | Admitting: *Deleted

## 2023-06-18 NOTE — Pre-Procedure Instructions (Signed)
Interpreter number [phone] Preadmission screen

## 2023-06-18 NOTE — Telephone Encounter (Signed)
Preadmission screen  

## 2023-06-22 ENCOUNTER — Telehealth (HOSPITAL_COMMUNITY): Payer: Self-pay | Admitting: *Deleted

## 2023-06-22 NOTE — Telephone Encounter (Signed)
Preadmission screen  

## 2023-06-24 ENCOUNTER — Encounter (HOSPITAL_COMMUNITY): Payer: Self-pay

## 2023-06-29 ENCOUNTER — Encounter (HOSPITAL_COMMUNITY)
Admission: RE | Admit: 2023-06-29 | Discharge: 2023-06-29 | Disposition: A | Discharge status: Home / Self Care | Payer: Medicaid Other | Source: Ambulatory Visit | Attending: Obstetrics and Gynecology | Admitting: Obstetrics and Gynecology

## 2023-06-29 DIAGNOSIS — Z01818 Encounter for other preprocedural examination: Secondary | ICD-10-CM | POA: Insufficient documentation

## 2023-06-29 DIAGNOSIS — Z98891 History of uterine scar from previous surgery: Secondary | ICD-10-CM | POA: Insufficient documentation

## 2023-06-29 LAB — CBC
HCT: 38.9 % (ref 36.0–46.0)
Hemoglobin: 12.8 g/dL (ref 12.0–15.0)
MCH: 28.6 pg (ref 26.0–34.0)
MCHC: 32.9 g/dL (ref 30.0–36.0)
MCV: 86.8 fL (ref 80.0–100.0)
Platelets: 192 10*3/uL (ref 150–400)
RBC: 4.48 MIL/uL (ref 3.87–5.11)
RDW: 14.8 % (ref 11.5–15.5)
WBC: 4.8 10*3/uL (ref 4.0–10.5)
nRBC: 0 % (ref 0.0–0.2)

## 2023-06-29 LAB — TYPE AND SCREEN
ABO/RH(D): O POS
Antibody Screen: NEGATIVE

## 2023-06-30 ENCOUNTER — Telehealth (HOSPITAL_COMMUNITY): Payer: Self-pay | Admitting: *Deleted

## 2023-06-30 DIAGNOSIS — Z3483 Encounter for supervision of other normal pregnancy, third trimester: Secondary | ICD-10-CM | POA: Diagnosis not present

## 2023-06-30 LAB — RPR: RPR Ser Ql: NONREACTIVE

## 2023-06-30 NOTE — Telephone Encounter (Signed)
[phone] interpreter number

## 2023-07-01 ENCOUNTER — Inpatient Hospital Stay (HOSPITAL_COMMUNITY): Payer: Self-pay | Admitting: Anesthesiology

## 2023-07-01 ENCOUNTER — Inpatient Hospital Stay (HOSPITAL_COMMUNITY)
Admission: RE | Admit: 2023-07-01 | Discharge: 2023-07-04 | DRG: 788 | Disposition: A | Discharge status: Home / Self Care | Payer: Medicaid Other | Attending: Obstetrics and Gynecology | Admitting: Obstetrics and Gynecology

## 2023-07-01 ENCOUNTER — Inpatient Hospital Stay (HOSPITAL_COMMUNITY): Payer: Medicaid Other | Admitting: Anesthesiology

## 2023-07-01 ENCOUNTER — Encounter (HOSPITAL_COMMUNITY): Payer: Self-pay | Admitting: Anesthesiology

## 2023-07-01 ENCOUNTER — Other Ambulatory Visit: Payer: Self-pay

## 2023-07-01 ENCOUNTER — Encounter (HOSPITAL_COMMUNITY)
Admission: RE | Disposition: A | Discharge status: Home / Self Care | Payer: Self-pay | Source: Home / Self Care | Attending: Obstetrics and Gynecology

## 2023-07-01 ENCOUNTER — Encounter (HOSPITAL_COMMUNITY): Payer: Self-pay | Admitting: Obstetrics and Gynecology

## 2023-07-01 DIAGNOSIS — Z98891 History of uterine scar from previous surgery: Principal | ICD-10-CM

## 2023-07-01 DIAGNOSIS — O26893 Other specified pregnancy related conditions, third trimester: Secondary | ICD-10-CM | POA: Diagnosis present

## 2023-07-01 DIAGNOSIS — Z3A00 Weeks of gestation of pregnancy not specified: Secondary | ICD-10-CM

## 2023-07-01 DIAGNOSIS — O99214 Obesity complicating childbirth: Secondary | ICD-10-CM | POA: Diagnosis not present

## 2023-07-01 DIAGNOSIS — Z3A39 39 weeks gestation of pregnancy: Secondary | ICD-10-CM | POA: Diagnosis not present

## 2023-07-01 DIAGNOSIS — D259 Leiomyoma of uterus, unspecified: Secondary | ICD-10-CM | POA: Diagnosis not present

## 2023-07-01 DIAGNOSIS — O3663X0 Maternal care for excessive fetal growth, third trimester, not applicable or unspecified: Secondary | ICD-10-CM | POA: Diagnosis present

## 2023-07-01 DIAGNOSIS — O99824 Streptococcus B carrier state complicating childbirth: Secondary | ICD-10-CM | POA: Diagnosis not present

## 2023-07-01 DIAGNOSIS — O3413 Maternal care for benign tumor of corpus uteri, third trimester: Secondary | ICD-10-CM | POA: Diagnosis present

## 2023-07-01 DIAGNOSIS — Z8679 Personal history of other diseases of the circulatory system: Secondary | ICD-10-CM

## 2023-07-01 DIAGNOSIS — R030 Elevated blood-pressure reading, without diagnosis of hypertension: Secondary | ICD-10-CM | POA: Diagnosis present

## 2023-07-01 DIAGNOSIS — Z758 Other problems related to medical facilities and other health care: Secondary | ICD-10-CM | POA: Diagnosis present

## 2023-07-01 DIAGNOSIS — O34211 Maternal care for low transverse scar from previous cesarean delivery: Secondary | ICD-10-CM | POA: Diagnosis not present

## 2023-07-01 DIAGNOSIS — O34219 Maternal care for unspecified type scar from previous cesarean delivery: Secondary | ICD-10-CM

## 2023-07-01 DIAGNOSIS — O99210 Obesity complicating pregnancy, unspecified trimester: Secondary | ICD-10-CM | POA: Diagnosis present

## 2023-07-01 DIAGNOSIS — Z789 Other specified health status: Secondary | ICD-10-CM | POA: Diagnosis present

## 2023-07-01 DIAGNOSIS — O99820 Streptococcus B carrier state complicating pregnancy: Secondary | ICD-10-CM | POA: Diagnosis not present

## 2023-07-01 DIAGNOSIS — Z6835 Body mass index (BMI) 35.0-35.9, adult: Secondary | ICD-10-CM

## 2023-07-01 SURGERY — Surgical Case
Anesthesia: Spinal | Site: Abdomen

## 2023-07-01 MED ORDER — OXYTOCIN-SODIUM CHLORIDE 30-0.9 UT/500ML-% IV SOLN: 2.5000 [IU]/h | INTRAVENOUS | Status: AC

## 2023-07-01 MED ORDER — IBUPROFEN 600 MG PO TABS
600.0000 mg | ORAL_TABLET | Freq: Four times a day (QID) | ORAL | Status: AC
  Administered 2023-07-01 – 2023-07-04 (×12): 600 mg via ORAL
  Filled 2023-07-01 (×12): qty 1

## 2023-07-01 MED ORDER — MORPHINE SULFATE (PF) 0.5 MG/ML IJ SOLN
INTRAMUSCULAR | Status: AC
  Filled 2023-07-01: qty 10

## 2023-07-01 MED ORDER — MORPHINE SULFATE (PF) 0.5 MG/ML IJ SOLN
INTRAMUSCULAR | Status: DC | PRN
  Administered 2023-07-01: 150 ug via INTRATHECAL

## 2023-07-01 MED ORDER — SODIUM CHLORIDE 0.9 % IR SOLN
Status: DC | PRN
  Administered 2023-07-01: 1000 mL

## 2023-07-01 MED ORDER — DIPHENHYDRAMINE HCL 25 MG PO CAPS
25.0000 mg | ORAL_CAPSULE | ORAL | Status: DC | PRN
  Filled 2023-07-01 (×2): qty 1

## 2023-07-01 MED ORDER — PHENYLEPHRINE HCL-NACL 20-0.9 MG/250ML-% IV SOLN
INTRAVENOUS | Status: DC | PRN
  Administered 2023-07-01: 60 ug/min via INTRAVENOUS

## 2023-07-01 MED ORDER — FENTANYL CITRATE (PF) 100 MCG/2ML IJ SOLN
INTRAMUSCULAR | Status: DC | PRN
  Administered 2023-07-01: 15 ug via INTRATHECAL

## 2023-07-01 MED ORDER — ONDANSETRON HCL 4 MG/2ML IJ SOLN
INTRAMUSCULAR | Status: DC | PRN
  Administered 2023-07-01: 4 mg via INTRAVENOUS

## 2023-07-01 MED ORDER — ACETAMINOPHEN 10 MG/ML IV SOLN
INTRAVENOUS | Status: DC | PRN
  Administered 2023-07-01: 1000 mg via INTRAVENOUS

## 2023-07-01 MED ORDER — TRANEXAMIC ACID-NACL 1000-0.7 MG/100ML-% IV SOLN
1000.0000 mg | Freq: Once | INTRAVENOUS | Status: AC
  Administered 2023-07-01 (×2): 1000 mg via INTRAVENOUS

## 2023-07-01 MED ORDER — MEASLES, MUMPS & RUBELLA VAC IJ SOLR: 0.5000 mL | Freq: Once | INTRAMUSCULAR | Status: DC

## 2023-07-01 MED ORDER — PHENYLEPHRINE HCL-NACL 20-0.9 MG/250ML-% IV SOLN
INTRAVENOUS | Status: AC
  Filled 2023-07-01: qty 250

## 2023-07-01 MED ORDER — SCOPOLAMINE 1 MG/3DAYS TD PT72: 1.0000 | MEDICATED_PATCH | Freq: Once | TRANSDERMAL | Status: DC

## 2023-07-01 MED ORDER — SIMETHICONE 80 MG PO CHEW: 80.0000 mg | CHEWABLE_TABLET | ORAL | Status: DC | PRN

## 2023-07-01 MED ORDER — KETOROLAC TROMETHAMINE 30 MG/ML IJ SOLN: 30.0000 mg | Freq: Once | INTRAMUSCULAR | Status: DC | PRN

## 2023-07-01 MED ORDER — OXYTOCIN-SODIUM CHLORIDE 30-0.9 UT/500ML-% IV SOLN
INTRAVENOUS | Status: DC | PRN
  Administered 2023-07-01: 41.7 mL/h via INTRAVENOUS
  Administered 2023-07-01: 500 mL via INTRAVENOUS

## 2023-07-01 MED ORDER — SODIUM CHLORIDE 0.9% FLUSH: 3.0000 mL | INTRAVENOUS | Status: DC | PRN

## 2023-07-01 MED ORDER — ZOLPIDEM TARTRATE 5 MG PO TABS: 5.0000 mg | ORAL_TABLET | Freq: Every evening | ORAL | Status: DC | PRN

## 2023-07-01 MED ORDER — CEFAZOLIN SODIUM-DEXTROSE 2-4 GM/100ML-% IV SOLN
2.0000 g | INTRAVENOUS | Status: AC
  Administered 2023-07-01: 2 g via INTRAVENOUS

## 2023-07-01 MED ORDER — DIBUCAINE (PERIANAL) 1 % EX OINT: 1.0000 | TOPICAL_OINTMENT | CUTANEOUS | Status: DC | PRN

## 2023-07-01 MED ORDER — FENTANYL CITRATE (PF) 100 MCG/2ML IJ SOLN
INTRAMUSCULAR | Status: AC
  Filled 2023-07-01: qty 2

## 2023-07-01 MED ORDER — ENOXAPARIN SODIUM 60 MG/0.6ML IJ SOSY
50.0000 mg | PREFILLED_SYRINGE | INTRAMUSCULAR | Status: DC
  Administered 2023-07-02 – 2023-07-04 (×3): 50 mg via SUBCUTANEOUS
  Filled 2023-07-01 (×3): qty 0.6

## 2023-07-01 MED ORDER — POVIDONE-IODINE 10 % EX SWAB: 2.0000 | Freq: Once | CUTANEOUS | Status: DC

## 2023-07-01 MED ORDER — OXYCODONE HCL 5 MG PO TABS
5.0000 mg | ORAL_TABLET | ORAL | Status: AC
  Administered 2023-07-01: 5 mg via ORAL
  Filled 2023-07-01: qty 1

## 2023-07-01 MED ORDER — PHENYLEPHRINE HCL (PRESSORS) 10 MG/ML IV SOLN
INTRAVENOUS | Status: DC | PRN
  Administered 2023-07-01 (×3): 80 ug via INTRAVENOUS

## 2023-07-01 MED ORDER — STERILE WATER FOR IRRIGATION IR SOLN
Status: DC | PRN
  Administered 2023-07-01: 1000 mL

## 2023-07-01 MED ORDER — NALOXONE HCL 0.4 MG/ML IJ SOLN: 0.4000 mg | INTRAMUSCULAR | Status: DC | PRN

## 2023-07-01 MED ORDER — DIPHENHYDRAMINE HCL 50 MG/ML IJ SOLN: 12.5000 mg | INTRAMUSCULAR | Status: DC | PRN

## 2023-07-01 MED ORDER — ACETAMINOPHEN 500 MG PO TABS
1000.0000 mg | ORAL_TABLET | Freq: Four times a day (QID) | ORAL | Status: DC
  Administered 2023-07-01 – 2023-07-04 (×12): 1000 mg via ORAL
  Filled 2023-07-01 (×12): qty 2

## 2023-07-01 MED ORDER — DEXAMETHASONE SODIUM PHOSPHATE 4 MG/ML IJ SOLN
INTRAMUSCULAR | Status: AC
  Filled 2023-07-01: qty 2

## 2023-07-01 MED ORDER — DEXAMETHASONE SODIUM PHOSPHATE 10 MG/ML IJ SOLN
INTRAMUSCULAR | Status: DC | PRN
  Administered 2023-07-01: 8 mg via INTRAVENOUS

## 2023-07-01 MED ORDER — ONDANSETRON HCL 4 MG/2ML IJ SOLN
INTRAMUSCULAR | Status: AC
  Filled 2023-07-01: qty 2

## 2023-07-01 MED ORDER — OXYCODONE HCL 5 MG PO TABS: 5.0000 mg | ORAL_TABLET | ORAL | Status: DC | PRN

## 2023-07-01 MED ORDER — TRANEXAMIC ACID-NACL 1000-0.7 MG/100ML-% IV SOLN
INTRAVENOUS | Status: AC
  Filled 2023-07-01: qty 200

## 2023-07-01 MED ORDER — CEFAZOLIN SODIUM-DEXTROSE 2-4 GM/100ML-% IV SOLN
INTRAVENOUS | Status: AC
  Filled 2023-07-01: qty 100

## 2023-07-01 MED ORDER — MENTHOL 3 MG MT LOZG: 1.0000 | LOZENGE | OROMUCOSAL | Status: DC | PRN

## 2023-07-01 MED ORDER — LACTATED RINGERS IV SOLN: INTRAVENOUS | Status: AC

## 2023-07-01 MED ORDER — WITCH HAZEL-GLYCERIN EX PADS: 1.0000 | MEDICATED_PAD | CUTANEOUS | Status: DC | PRN

## 2023-07-01 MED ORDER — COCONUT OIL OIL
1.0000 | TOPICAL_OIL | Status: DC | PRN
  Administered 2023-07-02: 1 via TOPICAL

## 2023-07-01 MED ORDER — DIPHENHYDRAMINE HCL 25 MG PO CAPS
25.0000 mg | ORAL_CAPSULE | Freq: Four times a day (QID) | ORAL | Status: DC | PRN
  Administered 2023-07-01 – 2023-07-02 (×2): 25 mg via ORAL

## 2023-07-01 MED ORDER — PRENATAL MULTIVITAMIN CH
1.0000 | ORAL_TABLET | Freq: Every day | ORAL | Status: DC
  Administered 2023-07-02 – 2023-07-04 (×3): 1 via ORAL
  Filled 2023-07-01 (×3): qty 1

## 2023-07-01 MED ORDER — NALOXONE HCL 4 MG/10ML IJ SOLN: 1.0000 ug/kg/h | INTRAVENOUS | Status: DC | PRN

## 2023-07-01 MED ORDER — PHENYLEPHRINE 80 MCG/ML (10ML) SYRINGE FOR IV PUSH (FOR BLOOD PRESSURE SUPPORT)
PREFILLED_SYRINGE | INTRAVENOUS | Status: AC
  Filled 2023-07-01: qty 10

## 2023-07-01 MED ORDER — ONDANSETRON HCL 4 MG/2ML IJ SOLN: 4.0000 mg | Freq: Three times a day (TID) | INTRAMUSCULAR | Status: DC | PRN

## 2023-07-01 MED ORDER — SIMETHICONE 80 MG PO CHEW
80.0000 mg | CHEWABLE_TABLET | Freq: Three times a day (TID) | ORAL | Status: DC
  Administered 2023-07-02 – 2023-07-04 (×7): 80 mg via ORAL
  Filled 2023-07-01 (×8): qty 1

## 2023-07-01 MED ORDER — LACTATED RINGERS IV SOLN
INTRAVENOUS | Status: DC
  Administered 2023-07-01: 125 mL/h via INTRAVENOUS

## 2023-07-01 MED ORDER — SENNOSIDES-DOCUSATE SODIUM 8.6-50 MG PO TABS
2.0000 | ORAL_TABLET | ORAL | Status: DC
  Administered 2023-07-02 – 2023-07-04 (×3): 2 via ORAL
  Filled 2023-07-01 (×3): qty 2

## 2023-07-01 MED ORDER — BUPIVACAINE IN DEXTROSE 0.75-8.25 % IT SOLN
INTRATHECAL | Status: DC | PRN
  Administered 2023-07-01: 1.6 mL via INTRATHECAL

## 2023-07-01 MED ORDER — ACETAMINOPHEN 10 MG/ML IV SOLN
INTRAVENOUS | Status: AC
  Filled 2023-07-01: qty 100

## 2023-07-01 MED ORDER — TRANEXAMIC ACID-NACL 1000-0.7 MG/100ML-% IV SOLN
INTRAVENOUS | Status: AC
  Filled 2023-07-01: qty 100

## 2023-07-01 SURGICAL SUPPLY — 32 items
APL PRP STRL LF DISP 70% ISPRP (MISCELLANEOUS) ×2
APL SKNCLS STERI-STRIP NONHPOA (GAUZE/BANDAGES/DRESSINGS) ×1
BENZOIN TINCTURE PRP APPL 2/3 (GAUZE/BANDAGES/DRESSINGS) ×1 IMPLANT
CHLORAPREP W/TINT 26 (MISCELLANEOUS) ×2 IMPLANT
CLAMP UMBILICAL CORD (MISCELLANEOUS) ×1 IMPLANT
CLOTH BEACON ORANGE TIMEOUT ST (SAFETY) ×1 IMPLANT
DRSG OPSITE POSTOP 4X10 (GAUZE/BANDAGES/DRESSINGS) ×1 IMPLANT
ELECT REM PT RETURN 9FT ADLT (ELECTROSURGICAL) ×1
ELECTRODE REM PT RTRN 9FT ADLT (ELECTROSURGICAL) ×1 IMPLANT
EXTRACTOR VACUUM M CUP 4 TUBE (SUCTIONS) IMPLANT
GAUZE SPONGE 4X4 12PLY STRL LF (GAUZE/BANDAGES/DRESSINGS) IMPLANT
GLOVE BIOGEL PI IND STRL 7.0 (GLOVE) ×2 IMPLANT
GLOVE BIOGEL PI IND STRL 7.5 (GLOVE) ×2 IMPLANT
GLOVE ECLIPSE 7.5 STRL STRAW (GLOVE) ×1 IMPLANT
GOWN STRL REUS W/TWL LRG LVL3 (GOWN DISPOSABLE) ×3 IMPLANT
HEMOSTAT ARISTA ABSORB 3G PWDR (HEMOSTASIS) IMPLANT
KIT ABG SYR 3ML LUER SLIP (SYRINGE) IMPLANT
NDL HYPO 25X5/8 SAFETYGLIDE (NEEDLE) IMPLANT
NEEDLE HYPO 25X5/8 SAFETYGLIDE (NEEDLE) IMPLANT
NS IRRIG 1000ML POUR BTL (IV SOLUTION) ×1 IMPLANT
PACK C SECTION WH (CUSTOM PROCEDURE TRAY) ×1 IMPLANT
PAD ABD DERMACEA PRESS 5X9 (GAUZE/BANDAGES/DRESSINGS) IMPLANT
PAD OB MATERNITY 4.3X12.25 (PERSONAL CARE ITEMS) ×1 IMPLANT
RTRCTR C-SECT PINK 25CM LRG (MISCELLANEOUS) ×1 IMPLANT
STRIP CLOSURE SKIN 1/2X4 (GAUZE/BANDAGES/DRESSINGS) ×1 IMPLANT
SUT VIC AB 0 CT1 36 (SUTURE) ×3 IMPLANT
SUT VIC AB 2-0 CT1 27 (SUTURE) ×1
SUT VIC AB 2-0 CT1 TAPERPNT 27 (SUTURE) ×1 IMPLANT
SUT VIC AB 4-0 KS 27 (SUTURE) ×1 IMPLANT
TOWEL OR 17X24 6PK STRL BLUE (TOWEL DISPOSABLE) ×1 IMPLANT
TRAY FOLEY W/BAG SLVR 14FR LF (SET/KITS/TRAYS/PACK) ×1 IMPLANT
WATER STERILE IRR 1000ML POUR (IV SOLUTION) ×1 IMPLANT

## 2023-07-01 NOTE — Discharge Summary (Signed)
Postpartum Discharge Summary  Date of Service updated 07/04/23      Patient Name: Anne Murillo DOB: 1986/08/24 MRN: [id number]  Date of admission: 07/01/2023 Delivery date:07/01/2023 Delivering provider: Shonna Chock [place] Date of discharge: 07/04/2023  Admitting diagnosis: Status post repeat low transverse cesarean section [Z98.891] Intrauterine pregnancy: [redacted]w[redacted]d     Secondary diagnosis:  Principal Problem:   Status post repeat low transverse cesarean section Active Problems:   Language barrier   History of postpartum pre-eclampsia   Obesity in pregnancy   BMI 35.0-35.9,adult   Non-English speaking patient [facility] preferred)  Additional problems: None    Discharge diagnosis: Term Pregnancy Delivered                                              Post partum procedures: None Augmentation: N/A Complications: None  Hospital course: Scheduled C/S   37 y.o. yo [id number] at [redacted]w[redacted]d was admitted to the hospital 07/01/2023 for scheduled cesarean section with the following indication: hx of 3 prior C/S .Delivery details are as follows:  Membrane Rupture Time/Date: 1:10 PM,07/01/2023  Delivery Method:C-Section, Low Transverse Operative Delivery:N/A Details of operation can be found in separate operative note.  Patient had a postpartum course uncomplicated.  She is ambulating, tolerating a regular diet, passing flatus, and urinating well. Patient is discharged home in stable condition on  07/04/23        Newborn Data: Birth date:07/01/2023 Birth time:1:10 PM Gender:Female Living status:Living Apgars:8 ,9  Weight:3890 g    Magnesium Sulfate received: No BMZ received: No Rhophylac:N/A MMR:N/A T-DaP:Given prenatally Flu: Yes RSV Vaccine received: No Transfusion:No  Immunizations received: Immunization History  Administered Date(s) Administered   Influenza,inj,Quad PF,6+ Mos 06/20/2013, 06/04/2015, 06/30/2019   Influenza-Unspecified 06/15/2015, 11/23/2018   Tdap  09/12/2013, 06/30/2019    Physical exam  Vitals:   07/03/23 0520 07/03/23 1334 07/03/23 2111 07/04/23 0314  BP: 120/75 114/76 113/77 123/84  Pulse: 77 86 (!) 107 81  Resp: 16 16 18 16   Temp: 98.1 F (36.7 C) 99.1 F (37.3 C) 98.6 F (37 C) 98.8 F (37.1 C)  TempSrc: Oral Oral Oral Oral  SpO2:   100% 100%  Weight:      Height:       General: alert, cooperative, and no distress Lochia: appropriate Uterine Fundus: firm Incision: Healing well with no significant drainage, No significant erythema, Dressing is clean, dry, and intact DVT Evaluation: No evidence of DVT seen on physical exam. Negative Homan's sign. Labs: Lab Results  Component Value Date   WBC 13.3 (H) 07/02/2023   HGB 13.0 07/02/2023   HCT 38.3 07/02/2023   MCV 88.7 07/02/2023   PLT 174 07/02/2023      Latest Ref Rng & Units 11/06/2020    1:19 PM  CMP  Glucose 70 - 99 mg/dL 130   BUN 6 - 20 mg/dL 9   Creatinine 8.65 - 7.84 mg/dL 6.96   Sodium 295 - 284 mmol/L 140   Potassium 3.5 - 5.1 mmol/L 3.8   Chloride 98 - 111 mmol/L 103    Edinburgh Score:    07/02/2023    8:35 PM  Edinburgh Postnatal Depression Scale Screening Tool  I have been able to laugh and see the funny side of things. 0  I have looked forward with enjoyment to things. 0  I have blamed myself unnecessarily when things went wrong.  1  I have been anxious or worried for no good reason. 1  I have felt scared or panicky for no good reason. 0  Things have been getting on top of me. 0  I have been so unhappy that I have had difficulty sleeping. 0  I have felt sad or miserable. 0  I have been so unhappy that I have been crying. 0  The thought of harming myself has occurred to me. 0  Edinburgh Postnatal Depression Scale Total 2   No data recorded   After visit meds:  Allergies as of 07/04/2023   No Known Allergies      Medication List     STOP taking these medications    aspirin EC 81 MG tablet       TAKE these medications     acetaminophen 500 MG tablet Commonly known as: TYLENOL Take 2 tablets (1,000 mg total) by mouth every 6 (six) hours.   coconut oil Oil Apply 1 Application topically as needed.   ibuprofen 600 MG tablet Commonly known as: ADVIL Take 1 tablet (600 mg total) by mouth every 6 (six) hours.   oxyCODONE 5 MG immediate release tablet Commonly known as: Oxy IR/ROXICODONE Take 1 tablet (5 mg total) by mouth every 4 (four) hours as needed for moderate pain (pain score 4-6).   Prenatal Vitamin Plus Low Iron 27-1 MG Tabs Take 1 tablet by mouth daily.   senna-docusate 8.6-50 MG tablet Commonly known as: Senokot-S Take 2 tablets by mouth daily.   witch hazel-glycerin pad Commonly known as: TUCKS Apply 1 Application topically as needed for hemorrhoids.         Discharge home in stable condition Infant Feeding: Bottle Infant Disposition:home with mother Discharge instruction: per After Visit Summary and Postpartum booklet. Activity: Advance as tolerated. Pelvic rest for 6 weeks.  Diet: routine diet Future Appointments:No future appointments. Follow up Visit:  Follow-up Information     Department, [facility] Follow up in 1 week(s).   Why: Call for appointment for incision check in 1 week Contact information: [address] [place] [address] [phone]                Message sent to [facility] for incision check 10/20  Please schedule this patient for a In person postpartum visit in 4 weeks with the following provider: Any provider. Additional Postpartum F/U:Incision check 1 week  High risk pregnancy complicated by:  Elevated BP w/o dx of GHTN, LGA, repeat CS given hx of 3 prior CS  Delivery mode:  C-Section, Low Transverse Anticipated Birth Control:  Unsure   07/04/2023 Sundra Aland, MD

## 2023-07-01 NOTE — Transfer of Care (Signed)
Immediate Anesthesia Transfer of Care Note  Patient: Anne Murillo  Procedure(s) Performed: CESAREAN SECTION (Abdomen)  Patient Location: PACU  Anesthesia Type:Spinal and Epidural  Level of Consciousness: awake, alert , and oriented  Airway & Oxygen Therapy: Patient Spontanous Breathing  Post-op Assessment: Report given to RN and Post -op Vital signs reviewed and stable  Post vital signs: Reviewed and stable  Last Vitals:  Vitals Value Taken Time  BP 115/70 (83) 07/01/23 1418  Temp    Pulse 70 07/01/23 1416  Resp 18 07/01/23 1416  SpO2 97 % 07/01/23 1416  Vitals shown include unfiled device data.  Last Pain:  Vitals:   07/01/23 1032  TempSrc:   PainSc: 6          Complications: No notable events documented.

## 2023-07-01 NOTE — Anesthesia Postprocedure Evaluation (Signed)
Anesthesia Post Note  Patient: Anne Murillo  Procedure(s) Performed: CESAREAN SECTION (Abdomen)     Patient location during evaluation: Mother Baby Anesthesia Type: Combined Spinal/Epidural Level of consciousness: oriented and awake and alert Pain management: pain level controlled Vital Signs Assessment: post-procedure vital signs reviewed and stable Respiratory status: spontaneous breathing and respiratory function stable Cardiovascular status: blood pressure returned to baseline and stable Postop Assessment: no headache, no backache, no apparent nausea or vomiting and able to ambulate Anesthetic complications: no   No notable events documented.  Last Vitals:  Vitals:   07/01/23 1515 07/01/23 1524  BP: 123/72 119/73  Pulse: 67 65  Resp:    Temp:  36.6 C  SpO2: 97% 100%    Last Pain:  Vitals:   07/01/23 1524  TempSrc: Oral  PainSc: 6                  [name] P Orson Rho

## 2023-07-01 NOTE — Anesthesia Preprocedure Evaluation (Addendum)
Anesthesia Evaluation  Patient identified by MRN, date of birth, ID band Patient awake    Reviewed: Allergy & Precautions, NPO status , Patient's Chart, lab work & pertinent test results  Airway Mallampati: II  TM Distance: >3 FB Neck ROM: Full    Dental no notable dental hx.    Pulmonary neg pulmonary ROS   Pulmonary exam normal        Cardiovascular negative cardio ROS  Rhythm:Regular Rate:Normal     Neuro/Psych negative neurological ROS  negative psych ROS   GI/Hepatic negative GI ROS, Neg liver ROS,,,  Endo/Other  negative endocrine ROS    Renal/GU negative Renal ROS  negative genitourinary   Musculoskeletal negative musculoskeletal ROS (+)    Abdominal Normal abdominal exam  (+)   Peds  Hematology Lab Results      Component                Value               Date                      WBC                      4.8                 06/29/2023                HGB                      12.8                06/29/2023                HCT                      38.9                06/29/2023                MCV                      86.8                06/29/2023                PLT                      192                 06/29/2023              Anesthesia Other Findings   Reproductive/Obstetrics (+) Pregnancy                             Anesthesia Physical Anesthesia Plan  ASA: 2  Anesthesia Plan: Combined Spinal and Epidural   Post-op Pain Management:    Induction:   PONV Risk Score and Plan: 2 and Treatment may vary due to age or medical condition, Ondansetron and Dexamethasone  Airway Management Planned: Natural Airway  Additional Equipment: None  Intra-op Plan:   Post-operative Plan:   Informed Consent: I have reviewed the patients History and Physical, chart, labs and discussed the procedure including the risks, benefits and alternatives for the proposed anesthesia with the  patient or authorized representative who has  indicated his/her understanding and acceptance.     Dental advisory given  Plan Discussed with: CRNA  Anesthesia Plan Comments:        Anesthesia Quick Evaluation

## 2023-07-01 NOTE — Op Note (Signed)
Cesarean Section Operative Report  Anne Murillo  07/01/2023  Indications: history 3 prior cesarean section  Pre-operative Diagnosis: repeat low transverse cesarean section   Post-operative Diagnosis: repeat low transverse cesarean section, vacuum-assisted delivery  Surgeon: Surgeons and Role:    * Grady Mohabir, Wilfred Curtis, MD - assisting    * Judd Lien Wenda Low, MD - Fellow    * Adam Phenix, MD - primary  Attending Attestation: I was present and scrubbed for the entire procedure.   An experienced assistant was required given the standard of surgical care given the complexity of the case.  This assistant was needed for exposure, dissection, suctioning, retraction, instrument exchange, assisting with delivery with administration of fundal pressure, and for overall help during the procedure.  Anesthesia: combined spinal/epidural   quantified Blood Loss: 584 ml  Total IV Fluids: 1500 ml LR  Urine Output:: 125 ml clear yellow urine  Specimens: none  Findings: Viable female infant in cephalic presentation; Apgars pending; weight pending; arterial cord pH not obtained;  clear amniotic fluid; intact placenta with three vessel cord; normal uterus, fallopian tubes and ovaries bilaterally. Dense adhesions to the anterior rectus. No significant intra-peritoneal adhesive disease  Baby condition / location:  Couplet care / Skin to Skin   Complications: no complications  Indications: Anne Murillo is a 37 y.o. [id number] with an IUP [redacted]w[redacted]d presenting for scheduled repeat cesarean.  The risks, benefits, complications, treatment options, and exected outcomes were discussed with the patient . The patient dwith the proposed plan, giving informed consent. identified as Anne Murillo and the procedure verified as C-Section Delivery.  Procedure Details:  The patient was taken back to the operative suite where spinal/epidural anesthesia was placed.  A time out was held and the above information  confirmed.   TXA was given prophylactically and a second dose given around the time of the uterine closure.  After induction of anesthesia, the patient was draped and prepped in the usual sterile manner and placed in a dorsal supine position with a leftward tilt. A Pfannenstiel incision was made and carried down through the subcutaneous tissue to the fascia. Fascial incision was made and sharply extended transversely. The fascia was separated from the underlying rectus tissue superiorly and inferiorly. The fascia was densely adhered to the anterior rectus, so this took some time. The peritoneum was identified and sharply entered and extended longitudinally. Alexis retractor was placed. A bladder flap was not created. A low transverse uterine incision was made and extended bluntly. Delivered from cephalic presentation with vacuum-assistance (one pull, no pop-offs) was a viable infant with Apgars and weight as above.  After waiting 60 seconds for delayed cord cutting, the umbilical cord was clamped and cut cord blood was obtained for evaluation. Cord ph was not sent. The placenta was removed Intact and appeared normal. The uterine outline, tubes and ovaries appeared normal. The uterine incision was closed with running unlocked sutures 0-Vicryl in one layer.   Hemostasis was observed after placement of one figure-of-eight 0 vicryl suture on the left side of the hysterotomy and after bovie cautery. The peritoneum was closed with 0 vicryl. The rectus muscles were examined and hemostasis observed after bovie cautery and placement of one 0 vicryl figure of eight suture at the superior midline reflection of the fascia. Arista was placed for hemostasis. The fascia was then reapproximated with running sutures of 0-Vicryl. The skin was closed with 4-0 Vicryl.  Instrument, sponge, and needle counts were correct prior the abdominal closure and were correct  at the conclusion of the case.     Disposition: PACU -  hemodynamically stable.   Maternal Condition: stable       Signed: Silvano Bilis, MD 07/01/2023 1:53 PM

## 2023-07-01 NOTE — Lactation Note (Signed)
This note was copied from a baby's chart. Lactation Consultation Note  Patient Name: Girl Laconya Haefner Today's Date: 07/01/2023 Age:39 hours Reason for consult: Initial assessment;Term Mom has just been formula feeding d/t she states she has no milk yet. This is mom's 4th child and she will BF when her milk comes in. Mom states she has no questions or concerns. Encouraged mom to call for assistance if needed for latching. Mom is going to do her own thing for feeding as she has done w/her other children.   Maternal Data Does the patient have breastfeeding experience prior to this delivery?: Yes How long did the patient breastfeed?: 1st. now 37 yrs old BF for 1 yr 4 months, 2nd child now 50 yr old for 1 yr 6 months, 3rd child now 52yr BF for 7 months  Feeding    LATCH Score       Type of Nipple: Everted at rest and after stimulation  Comfort (Breast/Nipple): Soft / non-tender         Lactation Tools Discussed/Used    Interventions Interventions: [facility] brochure  Discharge    Consult Status Consult Status: Complete    Ivey Cina G 07/01/2023, 10:54 PM

## 2023-07-01 NOTE — H&P (Signed)
LABOR AND DELIVERY ADMISSION HISTORY AND PHYSICAL NOTE  Anne Murillo is a 37 y.o. female [id number] with IUP at [redacted]w[redacted]d by L/32 presenting for scheduled repeat cesarean.   She reports positive fetal movement. She denies leakage of fluid or vaginal bleeding.  Prenatal History/Complications:  Past Medical History: Past Medical History:  Diagnosis Date   Bilateral ovarian cysts    Fibroid    History of gestational hypertension    Urachal cyst     Past Surgical History: Past Surgical History:  Procedure Laterality Date   CESAREAN SECTION N/A 12/05/2013   Procedure: Primary Cesarean Section Delivery Baby Girl @ 0136, Apgars 9/9;  Surgeon: Tereso Newcomer, MD;  Location: [facility] ORS;  Service: Obstetrics;  Laterality: N/A;   CESAREAN SECTION N/A 07/20/2016   Procedure: CESAREAN SECTION;  Surgeon: Levie Heritage, DO;  Location: [facility] SU[facility];  Service: Obstetrics;  Laterality: N/A;   CESAREAN SECTION N/A 09/15/2019   Procedure: CESAREAN SECTION;  Surgeon: Conan Bowens, MD;  Location: [facility] LD ORS;  Service: Obstetrics;  Laterality: N/A;   CYSTOSCOPY W/ RETROGRADES Bilateral 11/06/2020   Procedure: CYSTOSCOPY WITH RETROGRADE PYELOGRAM;  Surgeon: Sebastian Ache, MD;  Location: [facility];  Service: Urology;  Laterality: Bilateral;   CYSTOSCOPY WITH BIOPSY N/A 11/06/2020   Procedure: CYSTOSCOPY WITH BLADDER BIOPSY;  Surgeon: Sebastian Ache, MD;  Location: [facility];  Service: Urology;  Laterality: N/A;  1 HR   DILATION AND EVACUATION N/A 07/20/2015   Procedure: DILATATION AND EVACUATION;  Surgeon: Carrington Clamp, MD;  Location: [facility];  Service: Gynecology;  Laterality: N/A;    Obstetrical History: OB History     Gravida  5   Para  3   Term  3   Preterm      AB  1   Living  3      SAB  1   IAB      Ectopic      Multiple  0   Live Births  3        Obstetric Comments  11/2013 pLTCS for NRFHT in 1st stage due to lates. 3680gm          Social History: Social History   Socioeconomic History   Marital status: Married    Spouse name: Not on file   Number of children: Not on file   Years of education: Not on file   Highest education level: Not on file  Occupational History   Not on file  Tobacco Use   Smoking status: Never   Smokeless tobacco: Never  Vaping Use   Vaping status: Never Used  Substance and Sexual Activity   Alcohol use: No   Drug use: No   Sexual activity: Not Currently    Birth control/protection: None  Other Topics Concern   Not on file  Social History Narrative   Originally [place]   [place] is primary language   Social Determinants of Corporate investment banker Strain: Not on file  Food Insecurity: Not on file  Transportation Needs: Not on file  Physical Activity: Not on file  Stress: Not on file  Social Connections: Not on file    Family History: Family History  Problem Relation Age of Onset   Diabetes Neg Hx    Hypertension Neg Hx     Allergies: No Known Allergies  Medications Prior to Admission  Medication Sig Dispense Refill Last Dose   aspirin EC 81 MG tablet Take 81  mg by mouth 4 (four) times a week. Swallow whole.      Prenatal Vit-Fe Fumarate-FA (PRENATAL VITAMIN PLUS LOW IRON) 27-1 MG TABS Take 1 tablet by mouth daily.        Review of Systems   All systems reviewed and negative except as stated in HPI  Blood pressure 129/82, pulse 76, temperature 98.4 F (36.9 C), temperature source Oral, resp. rate 16, height 5\' 3"  (1.6 m), weight 100.6 kg, last menstrual period 09/25/2022, SpO2 98%, currently breastfeeding. 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  Patient Active Problem List   Diagnosis Date Noted   Non-English speaking patient [facility] preferred) 01/06/2021   BMI 35.0-35.9,adult 10/23/2019   Obesity in pregnancy 07/14/2019   History of postpartum pre-eclampsia 04/03/2019   Leiomyoma of uterus 03/22/2019   History of 3 cesarean sections 03/16/2019   Language barrier 03/16/2019    Assessment: Anne Murillo is a 37 y.o. [id number] at [redacted]w[redacted]d here for scheduled repeat cesarean. Patient declined my offer of an interpreter.  The risks of cesarean section were discussed with the patient including but were not limited to: bleeding which may require transfusion or reoperation; infection which may require antibiotics; injury to bowel, bladder, ureters or other surrounding organs; injury to the fetus; need for additional procedures including hysterectomy in the event of a life-threatening hemorrhage; placental abnormalities wth subsequent pregnancies, incisional problems, thromboembolic phenomenon and other postoperative/anesthesia complications.  Patient declines tubal sterilization. Patient has been NPO since midnight she will remain NPO for procedure. Anesthesia and OR aware.  Preoperative prophylactic antibiotics and SCDs ordered on call to the OR.  To OR when ready.  #  Elevated bp: first bp mildly elevated, asymptomatic, normal on repeat, will monitor.  # Pain: will plan for CSE as this is her 4th  # LGA, borderline poly: most recent u/s growth at 93rd percentile and afi 23.4  # abx: prophylactic ancef  # Feeding: breast  # Contraception: extensive discussion today. Patient says she doesn't want more kids so I discussed tubal sterilization in depth and advised this is a good opportunity to do that. Patient declines, she is undecided regarding birth control, considering an iud. She declines post-placental placement and says she wants to next address contraception at her postpartum f/u appointment.   # circ: Gershon Cull 07/01/2023, 11:20 AM

## 2023-07-01 NOTE — Anesthesia Procedure Notes (Addendum)
Epidural Patient location during procedure: OB Start time: 07/01/2023 12:33 PM End time: 07/01/2023 12:37 PM  Staffing Anesthesiologist: Atilano Median, DO Performed: anesthesiologist   Preanesthetic Checklist Completed: patient identified, IV checked, site marked, risks and benefits discussed, surgical consent, monitors and equipment checked, pre-op evaluation and timeout performed  Epidural Patient position: sitting Prep: ChloraPrep Patient monitoring: heart rate, continuous pulse ox and blood pressure Approach: midline Location: L3-L4 Injection technique: LOR saline  Needle:  Needle type: Tuohy  Needle gauge: 17 G Needle length: 9 cm Needle insertion depth: 6 cm Catheter type: closed end flexible Catheter size: 20 Guage Catheter at skin depth: 11 cm Test dose: negative and 1.5% lidocaine with Epi 1:200 K  Assessment Events: blood not aspirated, injection not painful, no injection resistance and no paresthesia  Additional Notes - CSE. =CSF on spinal needle introduction. Catheter threaded without incident.   Patient identified. Risks/Benefits/Options discussed with patient including but not limited to bleeding, infection, nerve damage, paralysis, failed block, incomplete pain control, headache, blood pressure changes, nausea, vomiting, reactions to medications, itching and postpartum back pain. Confirmed with bedside nurse the patient's most recent platelet count. Confirmed with patient that they are not currently taking any anticoagulation, have any bleeding history or any family history of bleeding disorders. Patient expressed understanding and wished to proceed. All questions were answered. Sterile technique was used throughout the entire procedure. Please see nursing notes for vital signs. Test dose was given through epidural catheter and negative prior to continuing to dose epidural or start infusion. Warning signs of high block given to the patient including shortness  of breath, tingling/numbness in hands, complete motor block, or any concerning symptoms with instructions to call for help. Patient was given instructions on fall risk and not to get out of bed. All questions and concerns addressed with instructions to call with any issues or inadequate analgesia.    Reason for block:at surgeon's request, procedure for pain and surgical anesthesia

## 2023-07-02 LAB — CBC
HCT: 38.3 % (ref 36.0–46.0)
Hemoglobin: 13 g/dL (ref 12.0–15.0)
MCH: 30.1 pg (ref 26.0–34.0)
MCHC: 33.9 g/dL (ref 30.0–36.0)
MCV: 88.7 fL (ref 80.0–100.0)
Platelets: 174 10*3/uL (ref 150–400)
RBC: 4.32 MIL/uL (ref 3.87–5.11)
RDW: 14.7 % (ref 11.5–15.5)
WBC: 13.3 10*3/uL — ABNORMAL HIGH (ref 4.0–10.5)
nRBC: 0 % (ref 0.0–0.2)

## 2023-07-02 NOTE — Plan of Care (Signed)
[facility] Tonsillectomy/Adenoidectomy, Postoperative PEDS care plan entered in error.

## 2023-07-02 NOTE — Progress Notes (Signed)
POSTPARTUM PROGRESS NOTE  Post op Day 1 Subjective:  Anne Murillo is a 37 y.o. F6O1308 [redacted]w[redacted]d s/p rltcs.  No acute events overnight.  Pt denies problems with ambulating, voiding or po intake.  She denies nausea or vomiting.  Pain is well controlled.  She has had flatus. She has not had bowel movement.  Lochia Small.   Objective: Blood pressure 124/71, pulse 66, temperature 97.9 F (36.6 C), temperature source Oral, resp. rate 14, height 5\' 3"  (1.6 m), weight 100.6 kg, last menstrual period 09/25/2022, SpO2 98%, unknown if currently breastfeeding.  Physical Exam:  General: alert, cooperative and no distress Lochia:normal flow Chest: CTAB Heart: RRR no m/r/g Abdomen: +BS, soft, nontender,  Uterine Fundus: firm,  DVT Evaluation: No calf swelling or tenderness Extremities: trace edema  Recent Labs    07/02/23 0415  HGB 13.0  HCT 38.3    Assessment/Plan:  ASSESSMENT: Anne Murillo is a 37 y.o. M5H8469 [redacted]w[redacted]d s/p pltcs, doing well. Hgb appropriately. Female baby. Bleeding appropriate. Undecided on birth control. Breastfeeding. Plan for discharge tomorrow. Foley removed this morning, will monitor for voiding.   Plan for discharge tomorrow   LOS: 1 day   Silvano Bilis 07/02/2023, 12:19 PM

## 2023-07-03 MED ORDER — OXYCODONE HCL 5 MG PO TABS: 5.0000 mg | ORAL_TABLET | ORAL | 0 refills | Status: DC | PRN

## 2023-07-03 MED ORDER — WITCH HAZEL-GLYCERIN EX PADS: 1.0000 | MEDICATED_PAD | CUTANEOUS | 12 refills | Status: AC | PRN

## 2023-07-03 MED ORDER — SENNOSIDES-DOCUSATE SODIUM 8.6-50 MG PO TABS: 2.0000 | ORAL_TABLET | ORAL | Status: DC

## 2023-07-03 MED ORDER — ACETAMINOPHEN 500 MG PO TABS: 1000.0000 mg | ORAL_TABLET | Freq: Four times a day (QID) | ORAL | 0 refills | Status: AC

## 2023-07-03 MED ORDER — COCONUT OIL OIL: 1.0000 | TOPICAL_OIL | Status: DC | PRN

## 2023-07-03 MED ORDER — IBUPROFEN 600 MG PO TABS: 600.0000 mg | ORAL_TABLET | Freq: Four times a day (QID) | ORAL | 0 refills | Status: DC

## 2023-07-03 NOTE — Progress Notes (Addendum)
OB/GYN Faculty Attending Note  Post Op Day 2  Subjective: Patient is feeling okay. She reports moderately well controlled pain on PO pain meds. She is ambulating and denies light-headedness or dizziness. She is voiding spontaneously. She is passing flatus, has not had a BM yet. She is tolerating a regular diet without nausea/vomiting. Bleeding is moderate. She is breast feeding. Baby is in room and doing well.  Objective: Blood pressure 120/75, pulse 77, temperature 98.1 F (36.7 C), temperature source Oral, resp. rate 16, height 5\' 3"  (1.6 m), weight 100.6 kg, last menstrual period 09/25/2022, SpO2 100%, unknown if currently breastfeeding. Temp:  [98.1 F (36.7 C)-99.9 F (37.7 C)] 98.1 F (36.7 C) (10/19 0520) Pulse Rate:  [72-81] 77 (10/19 0520) Resp:  [16-18] 16 (10/19 0520) BP: (106-123)/(73-77) 120/75 (10/19 0520) SpO2:  [100 %] 100 % (10/18 2042)  Physical Exam:  General: alert, oriented, cooperative Chest: normal respiratory effort Heart: regular rate  Abdomen: soft, very tender to palpation, incision covered by dressing with no evidence of active bleeding  Uterine Fundus: firm, at the level of the umbilicus Lochia: moderate, rubra DVT Evaluation: no evidence of DVT Extremities: no edema, no calf tenderness  UOP: voiding spontaneously  Recent Labs    07/02/23 0415  HGB 13.0  HCT 38.3    Assessment/Plan: Patient Active Problem List   Diagnosis Date Noted   Status post repeat low transverse cesarean section 07/01/2023   Non-English speaking patient [facility] preferred) 01/06/2021   BMI 35.0-35.9,adult 10/23/2019   Obesity in pregnancy 07/14/2019   History of postpartum pre-eclampsia 04/03/2019   Leiomyoma of uterus 03/22/2019   History of 3 cesarean sections 03/16/2019   Language barrier 03/16/2019    Patient is 37 y.o. [id number] POD#2 s/p RCS at [redacted]w[redacted]d for prior c-section. She is doing well, recovering appropriately and complains only of pain.   Continue routine  post partum care Pain meds prn Regular diet Lovenox 40 mg daily [place] undecided for birth control Plan for discharge tomorrow  Husband interpreted for portions of interview that patient did not understand.   Baldemar Lenis, MD, [facility] Attending Center for [employer] (Faculty Practice)  07/03/2023, 10:38 AM

## 2023-07-12 NOTE — Progress Notes (Unsigned)
Incision Check Visit  Anne Murillo is here for incision check following repeat c-section on 07/01/23.   Assessment:  Education: Reviewed good wound care and s/s of infection with patient.  Patient will follow up {Nurse Visit Follow Up:28038}.  Meryl Crutch, RN 07/13/23 at

## 2023-07-13 ENCOUNTER — Ambulatory Visit (INDEPENDENT_AMBULATORY_CARE_PROVIDER_SITE_OTHER): Payer: Medicaid Other

## 2023-07-13 ENCOUNTER — Other Ambulatory Visit: Payer: Self-pay

## 2023-07-13 VITALS — BP 130/71 | HR 49 | Ht 63.0 in | Wt 205.0 lb

## 2023-07-13 DIAGNOSIS — Z4889 Encounter for other specified surgical aftercare: Secondary | ICD-10-CM

## 2023-07-16 ENCOUNTER — Encounter (HOSPITAL_COMMUNITY): Payer: Self-pay | Admitting: Family Medicine

## 2023-07-16 ENCOUNTER — Encounter (HOSPITAL_COMMUNITY): Payer: Self-pay

## 2023-07-16 ENCOUNTER — Ambulatory Visit (HOSPITAL_COMMUNITY)
Admission: EM | Admit: 2023-07-16 | Discharge: 2023-07-16 | Disposition: A | Discharge status: Home / Self Care | Payer: Medicaid Other | Attending: Internal Medicine | Admitting: Internal Medicine

## 2023-07-16 ENCOUNTER — Inpatient Hospital Stay (HOSPITAL_COMMUNITY): Payer: Medicaid Other

## 2023-07-16 ENCOUNTER — Inpatient Hospital Stay (HOSPITAL_COMMUNITY)
Admission: AD | Admit: 2023-07-16 | Discharge: 2023-07-16 | Disposition: A | Discharge status: Home / Self Care | Payer: Medicaid Other | Attending: Family Medicine | Admitting: Family Medicine

## 2023-07-16 DIAGNOSIS — Z79899 Other long term (current) drug therapy: Secondary | ICD-10-CM | POA: Diagnosis not present

## 2023-07-16 DIAGNOSIS — O165 Unspecified maternal hypertension, complicating the puerperium: Secondary | ICD-10-CM

## 2023-07-16 DIAGNOSIS — R519 Headache, unspecified: Secondary | ICD-10-CM | POA: Diagnosis not present

## 2023-07-16 DIAGNOSIS — O9089 Other complications of the puerperium, not elsewhere classified: Secondary | ICD-10-CM | POA: Insufficient documentation

## 2023-07-16 DIAGNOSIS — Z758 Other problems related to medical facilities and other health care: Secondary | ICD-10-CM

## 2023-07-16 DIAGNOSIS — Z603 Acculturation difficulty: Secondary | ICD-10-CM

## 2023-07-16 LAB — CBC
HCT: 42.5 % (ref 36.0–46.0)
Hemoglobin: 14 g/dL (ref 12.0–15.0)
MCH: 28.7 pg (ref 26.0–34.0)
MCHC: 32.9 g/dL (ref 30.0–36.0)
MCV: 87.3 fL (ref 80.0–100.0)
Platelets: 160 10*3/uL (ref 150–400)
RBC: 4.87 MIL/uL (ref 3.87–5.11)
RDW: 13.3 % (ref 11.5–15.5)
WBC: 4.1 10*3/uL (ref 4.0–10.5)
nRBC: 0 % (ref 0.0–0.2)

## 2023-07-16 LAB — COMPREHENSIVE METABOLIC PANEL
ALT: 23 U/L (ref 0–44)
AST: 17 U/L (ref 15–41)
Albumin: 3.4 g/dL — ABNORMAL LOW (ref 3.5–5.0)
Alkaline Phosphatase: 103 U/L (ref 38–126)
Anion gap: 5 (ref 5–15)
BUN: 10 mg/dL (ref 6–20)
CO2: 28 mmol/L (ref 22–32)
Calcium: 9.2 mg/dL (ref 8.9–10.3)
Chloride: 108 mmol/L (ref 98–111)
Creatinine, Ser: 0.94 mg/dL (ref 0.44–1.00)
GFR, Estimated: >60 mL/min (ref >60)
Glucose, Bld: 86 mg/dL (ref 70–99)
Potassium: 4.1 mmol/L (ref 3.5–5.1)
Sodium: 141 mmol/L (ref 135–145)
Total Bilirubin: 1.1 mg/dL (ref 0.3–1.2)
Total Protein: 6.6 g/dL (ref 6.5–8.1)

## 2023-07-16 MED ORDER — LABETALOL HCL 5 MG/ML IV SOLN: 20.0000 mg | INTRAVENOUS | Status: DC | PRN

## 2023-07-16 MED ORDER — NIFEDIPINE 10 MG PO CAPS
20.0000 mg | ORAL_CAPSULE | ORAL | Status: DC | PRN
Start: 2023-07-16 — End: 2023-07-16

## 2023-07-16 MED ORDER — DEXAMETHASONE SODIUM PHOSPHATE 10 MG/ML IJ SOLN
10.0000 mg | Freq: Once | INTRAMUSCULAR | Status: AC
  Administered 2023-07-16: 10 mg via INTRAVENOUS
  Filled 2023-07-16: qty 1

## 2023-07-16 MED ORDER — LABETALOL HCL 5 MG/ML IV SOLN: 40.0000 mg | INTRAVENOUS | Status: DC | PRN

## 2023-07-16 MED ORDER — PROCHLORPERAZINE EDISYLATE 10 MG/2ML IJ SOLN
10.0000 mg | Freq: Once | INTRAMUSCULAR | Status: AC
  Administered 2023-07-16: 10 mg via INTRAVENOUS
  Filled 2023-07-16: qty 2

## 2023-07-16 MED ORDER — HYDRALAZINE HCL 20 MG/ML IJ SOLN: 10.0000 mg | INTRAMUSCULAR | Status: DC | PRN

## 2023-07-16 MED ORDER — LABETALOL HCL 5 MG/ML IV SOLN: 80.0000 mg | INTRAVENOUS | Status: DC | PRN

## 2023-07-16 MED ORDER — DIPHENHYDRAMINE HCL 50 MG/ML IJ SOLN
25.0000 mg | INTRAMUSCULAR | Status: AC
  Administered 2023-07-16: 25 mg via INTRAVENOUS
  Filled 2023-07-16: qty 1

## 2023-07-16 MED ORDER — LACTATED RINGERS IV SOLN: INTRAVENOUS | Status: DC

## 2023-07-16 MED ORDER — ACETAMINOPHEN-CAFFEINE 500-65 MG PO TABS
2.0000 | ORAL_TABLET | Freq: Once | ORAL | Status: AC
Start: 2023-07-16 — End: 2023-07-16
  Administered 2023-07-16: 2 via ORAL
  Filled 2023-07-16: qty 2

## 2023-07-16 MED ORDER — FUROSEMIDE 20 MG PO TABS: 20.0000 mg | ORAL_TABLET | Freq: Every day | ORAL | 0 refills | Status: DC

## 2023-07-16 MED ORDER — NIFEDIPINE ER OSMOTIC RELEASE 30 MG PO TB24: 30.0000 mg | ORAL_TABLET | Freq: Every day | ORAL | 0 refills | Status: DC

## 2023-07-16 MED ORDER — NIFEDIPINE 10 MG PO CAPS
10.0000 mg | ORAL_CAPSULE | ORAL | Status: DC | PRN
  Administered 2023-07-16: 10 mg via ORAL
  Filled 2023-07-16: qty 1

## 2023-07-16 MED ORDER — HYDRALAZINE HCL 20 MG/ML IJ SOLN
10.0000 mg | INTRAMUSCULAR | Status: DC | PRN
  Administered 2023-07-16: 10 mg via INTRAVENOUS
  Filled 2023-07-16: qty 1

## 2023-07-16 MED ORDER — CYCLOBENZAPRINE HCL 5 MG PO TABS
10.0000 mg | ORAL_TABLET | Freq: Once | ORAL | Status: AC
  Administered 2023-07-16: 10 mg via ORAL
  Filled 2023-07-16: qty 2

## 2023-07-16 MED ORDER — HYDRALAZINE HCL 20 MG/ML IJ SOLN: 5.0000 mg | INTRAMUSCULAR | Status: DC | PRN

## 2023-07-16 NOTE — MAU Provider Note (Signed)
History     CSN: [id number]  Arrival date and time: 07/16/23 1009   Event Date/Time   First Provider Initiated Contact with Patient 07/16/23 1125      Chief Complaint  Patient presents with   Headache   Neck Pain   HPI Ms. Monquie Piatek is a 37 y.o. year old [id number] female at 87 days s/p C/S who presents to MAU reporting H/A  neck pain x 5 days; rated 10/10. She took oxycodone for C/S pain management last night without relief of H/A or neck pain. She had no BP issues in pregnancy. She was seen at Urgent Care this morning and was instructed to come here for further evaluation. She receives [facility] with [facility]; she is unsure of when her next appt is because no one has called her.  OB History     Gravida  5   Para  4   Term  4   Preterm      AB  1   Living  4      SAB  1   IAB      Ectopic      Multiple  0   Live Births  4        Obstetric Comments  11/2013 pLTCS for NRFHT in 1st stage due to lates. 3680gm         Past Medical History:  Diagnosis Date   Bilateral ovarian cysts    Fibroid    History of gestational hypertension    Urachal cyst     Past Surgical History:  Procedure Laterality Date   CESAREAN SECTION N/A 12/05/2013   Procedure: Primary Cesarean Section Delivery Baby Girl @ 0136, Apgars 9/9;  Surgeon: Tereso Newcomer, MD;  Location: [facility];  Service: Obstetrics;  Laterality: N/A;   CESAREAN SECTION N/A 07/20/2016   Procedure: CESAREAN SECTION;  Surgeon: Levie Heritage, DO;  Location: [facility] BIRTHING SUIT[facility];  Service: Obstetrics;  Laterality: N/A;   CESAREAN SECTION N/A 09/15/2019   Procedure: CESAREAN SECTION;  Surgeon: Conan Bowens, MD;  Location: [facility]S;  Service: Obstetrics;  Laterality: N/A;   CESAREAN SECTION N/A 07/01/2023   Procedure: CESAREAN SECTION;  Surgeon: Kathrynn Running, MD;  Location: [facility];  Service: Obstetrics;  Laterality: N/A;   CYSTOSCOPY W/ RETROGRADES Bilateral 11/06/2020   Procedure:  CYSTOSCOPY WITH RETROGRADE PYELOGRAM;  Surgeon: Sebastian Ache, MD;  Location[facility];  Service: Urology;  Laterality: Bilateral;   CYSTOSCOPY WITH BIOPSY N/A 11/06/2020   Procedure: CYSTOSCOPY WITH BLADDER BIOPSY;  Surgeon: Sebastian Ache, MD;  Location: [facility];  Service: Urology;  Laterality: N/A;  1 HR   DILATION AND EVACUATION N/A 07/20/2015   Procedure: DILATATION AND EVACUATION;  Surgeon: Carrington Clamp, MD;  Location: [facility];  Service: Gynecology;  Laterality: N/A;    Family History  Problem Relation Age of Onset   Diabetes Neg Hx    Hypertension Neg Hx     Social History   Tobacco Use   Smoking status: Never   Smokeless tobacco: Never  Vaping Use   Vaping status: Never Used  Substance Use Topics   Alcohol use: No   Drug use: No    Allergies: No Known Allergies  No medications prior to admission.    Review of Systems  Constitutional:  Positive for fatigue.  HENT: Negative.    Eyes: Negative.   Respiratory: Negative.    Cardiovascular: Negative.   Gastrointestinal: Negative.  Endocrine: Negative.   Genitourinary: Negative.   Musculoskeletal:  Positive for neck pain (x 5 days).  Skin: Negative.   Allergic/Immunologic: Negative.   Neurological:  Positive for headaches (x 5 days).  Hematological: Negative.   Psychiatric/Behavioral: Negative.     Physical Exam   Patient Vitals for the past 24 hrs:  BP Temp Pulse Resp SpO2  07/16/23 1650 (!) 153/80 -- (!) 51 -- 100 %  07/16/23 1601 (!) 143/79 -- (!) 51 -- --  07/16/23 1546 (!) 156/79 -- (!) 53 -- --  07/16/23 1531 (!) 150/75 -- (!) 51 -- --  07/16/23 1516 (!) 147/78 -- (!) 53 -- --  07/16/23 1515 -- -- -- -- 98 %  07/16/23 1501 (!) 146/77 -- (!) 52 -- --  07/16/23 1500 -- -- -- -- 98 %  07/16/23 1446 135/77 -- (!) 53 -- --  07/16/23 1445 -- -- -- -- 98 %  07/16/23 1431 (!) 149/75 -- (!) 54 -- --  07/16/23 1430 -- -- -- -- 99 %  07/16/23 1416 (!) 156/77 -- (!) 56  -- --  07/16/23 1415 -- -- -- -- 99 %  07/16/23 1401 (!) 156/78 -- (!) 56 -- --  07/16/23 1400 -- -- -- -- 98 %  07/16/23 1346 (!) 148/77 -- (!) 57 -- --  07/16/23 1345 -- -- -- -- 99 %  07/16/23 1245 (!) 154/76 -- 76 -- 96 %  07/16/23 1240 -- -- -- -- 96 %  07/16/23 1235 -- -- -- -- 97 %  07/16/23 1230 (!) 153/75 -- (!) 57 -- 98 %  07/16/23 1225 -- -- -- -- 99 %  07/16/23 1220 -- -- -- -- 100 %  07/16/23 1216 (!) 180/79 -- (!) 53 -- --  07/16/23 1215 -- -- -- -- 100 %  07/16/23 1210 -- -- -- -- 100 %  07/16/23 1205 -- -- -- -- 99 %  07/16/23 1200 (!) 149/79 -- (!) 56 -- 98 %  07/16/23 1155 -- -- -- -- 98 %  07/16/23 1150 -- -- -- -- 99 %  07/16/23 1146 (!) 183/80 -- (!) 55 -- --  07/16/23 1145 -- -- -- -- 99 %  07/16/23 1140 -- -- -- -- 100 %  07/16/23 1135 -- -- -- -- 100 %  07/16/23 1131 (!) 158/72 -- (!) 53 -- --  07/16/23 1130 -- -- -- -- 98 %  07/16/23 1125 -- -- -- -- 97 %  07/16/23 1120 -- -- -- -- 98 %  07/16/23 1116 (!) 172/75 -- (!) 46 -- --  07/16/23 1115 -- -- -- -- 99 %  07/16/23 1110 -- -- -- -- 100 %  07/16/23 1108 (!) 179/75 -- (!) 45 -- --  07/16/23 1105 -- -- -- -- 98 %  07/16/23 1034 (!) 174/79 98.6 F (37 C) (!) 46 18 --     Physical Exam Vitals and nursing note reviewed.  Constitutional:      Appearance: Normal appearance. She is obese.  HENT:     Head: Normocephalic and atraumatic.  Cardiovascular:     Rate and Rhythm: Normal rate.     Pulses: Normal pulses.     Heart sounds: Normal heart sounds.  Pulmonary:     Effort: Pulmonary effort is normal.     Breath sounds: Normal breath sounds.  Abdominal:     General: Bowel sounds are normal.     Palpations: Abdomen is soft.  Genitourinary:    Comments: Not  indicated Musculoskeletal:        General: No swelling. Normal range of motion.     Cervical back: Normal range of motion and neck supple.     Right lower leg: No edema.     Left lower leg: No edema.  Skin:    General: Skin is warm and  dry.  Neurological:     Mental Status: She is alert and oriented to person, place, and time.  Psychiatric:        Mood and Affect: Mood normal.        Behavior: Behavior normal.        Thought Content: Thought content normal.        Judgment: Judgment normal.   Reassessment @ 1300: H/A = 6-7/10; neck pain = 10/10 Reassessment @ 1405: H/A = 6/10; neck pain = 8/10 Reassessment @ 1510: H/A and neck pain = 4/10  MAU Course  Procedures  MDM CCUA CBC CMP Serial BP's  Hydralazine 10 mg IVP Procardia po protocol Flexeril 10 mg po  Excedrin Tension Headache 2 caplets  Results for orders placed or performed during the hospital encounter of 07/16/23 (from the past 24 hour(s))  CBC     Status: None   Collection Time: 07/16/23 10:46 AM  Result Value Ref Range   WBC 4.1 4.0 - 10.5 K/uL   RBC 4.87 3.87 - 5.11 MIL/uL   Hemoglobin 14.0 12.0 - 15.0 g/dL   HCT 82.9 56.2 - 13.0 %   MCV 87.3 80.0 - 100.0 fL   MCH 28.7 26.0 - 34.0 pg   MCHC 32.9 30.0 - 36.0 g/dL   RDW 86.5 78.4 - 69.6 %   Platelets 160 150 - 400 K/uL   nRBC 0.0 0.0 - 0.2 %  Comprehensive metabolic panel     Status: Abnormal   Collection Time: 07/16/23 10:46 AM  Result Value Ref Range   Sodium 141 135 - 145 mmol/L   Potassium 4.1 3.5 - 5.1 mmol/L   Chloride 108 98 - 111 mmol/L   CO2 28 22 - 32 mmol/L   Glucose, Bld 86 70 - 99 mg/dL   BUN 10 6 - 20 mg/dL   Creatinine, Ser 2.95 0.44 - 1.00 mg/dL   Calcium 9.2 8.9 - 28.4 mg/dL   Total Protein 6.6 6.5 - 8.1 g/dL   Albumin 3.4 (L) 3.5 - 5.0 g/dL   AST 17 15 - 41 U/L   ALT 23 0 - 44 U/L   Alkaline Phosphatase 103 38 - 126 U/L   Total Bilirubin 1.1 0.3 - 1.2 mg/dL   GFR, Estimated >13 >24 mL/min   Anion gap 5 5 - 15   CT HEAD WO CONTRAST ( )  Result Date: 07/16/2023 CLINICAL DATA:  Headache, neck pain EXAM: CT HEAD WITHOUT CONTRAST TECHNIQUE: Contiguous axial images were obtained from the base of the skull through the vertex without intravenous contrast. RADIATION  DOSE REDUCTION: This exam was performed according to the departmental dose-optimization program which includes automated exposure control, adjustment of the mA and/or kV according to patient size and/or use of iterative reconstruction technique. COMPARISON:  None Available. FINDINGS: Brain: There is no acute intracranial hemorrhage, extra-axial fluid collection, or acute infarct. Parenchymal volume is normal. The ventricles are normal in size. Gray-white differentiation is preserved. The pituitary and suprasellar region are normal. There is no mass lesion. There is no mass effect or midline shift. Vascular: No hyperdense vessel or unexpected calcification. Skull: Normal. Negative for fracture or focal lesion. Sinuses/Orbits:  The imaged paranasal sinuses are clear. The globes and orbits are unremarkable. Other: The mastoid air cells and middle ear cavities are clear. IMPRESSION: Normal head CT. Electronically Signed   By: Lesia Hausen M.D.   On: 07/16/2023 15:55    *Consult with Dr. Shawnie Pons @ 1233 - notified of patient's complaints, assessments, lab results  Update on head normal head CT;  tx plan d/c home with Procardia XL 30 mg daily - ok to d/c home, agrees with plan   Assessment and Plan  1. Postpartum hypertension - Information provided on HTN  - prescription for: Procardia XL 30 mg po daily - prescription for: Lasix 20 mg po daily x 5 days  2. Postpartum headache - Ok to take Tylenol 1000 mg po every 6 hrs prn   3. Language barrier affecting health care - [employer] Video [place] #[id number] used for assessment, Theodoro Kos #[id number] for Follow-Up assessment and Diane #[id number] for d/c instructions    - Discharge patient - BP check at [facility] on Thursday 07/22/2023 per Dr. Shawnie Pons. Msg sent to [facility] - Patient verbalized an understanding of the plan of care and agrees.   Raelyn Mora, CNM 07/16/2023, 11:25 AM

## 2023-07-16 NOTE — ED Triage Notes (Signed)
Pt c/o headache and neck pain for 5 days. States gave birth 2wks ago. Denies injury. Denies taking meds for pain.

## 2023-07-16 NOTE — MAU Note (Signed)
Provider notified of b/p 183/80

## 2023-07-16 NOTE — MAU Note (Signed)
.  Anne Murillo is a 37 y.o. at Unknown here in MAU reporting: PP 07/01/23 c-section. C/o headache  and neck pain. x 5 days. Has not taken any medication for headache.Took Oxycodone for C/S pain last night but did not help headache.  Did not have problems with b/p during pregnancy. Went to urgent care today and was sent to MAU. B/P was elevated at Urgent care.  LMP:  Onset of complaint: 5 days Pain score: 10 There were no vitals filed for this visit.   FHT:na Lab orders placed from triage:

## 2023-07-16 NOTE — Discharge Instructions (Signed)
Go directly to the MAU further evaluation

## 2023-07-16 NOTE — MAU Note (Signed)
CT requested transport for patient.

## 2023-07-16 NOTE — ED Notes (Signed)
Patient is being discharged from the Urgent Care and sent to the Emergency Department via POV . Per Angie, PA, patient is in need of higher level of care due to postpartum having headaches. Patient is aware and verbalizes understanding of plan of care.  Vitals:   07/16/23 0926  BP: (!) 160/74  Pulse: (!) 46  Resp: 18  Temp: 98.9 F (37.2 C)  SpO2: 97%

## 2023-07-16 NOTE — ED Provider Notes (Signed)
[facility]    CSN: [id number] Arrival date & time: 07/16/23  0859      History   Chief Complaint Chief Complaint  Patient presents with   Headache    HPI Anne Murillo is a 37 y.o. female.   The history is provided by the patient. The history is limited by a language barrier. A language interpreter was used.  Headache Associated symptoms: dizziness   Associated symptoms: no fever, no nausea, no vomiting and no weakness   2 weeks postpartum C-section delivery, headache onset 5 days ago, headache is global, constant, rates pain 10 out of 10, associated with neck pain.  Admits dizziness, and urinary frequency.  She is breast-feeding.  Admits pain at her surgical site.  Denies change in vision, fever, chills, sweats, fever, nausea, vomiting dysuria. Past medical history includes postpartum preeclampsia  Past Medical History:  Diagnosis Date   Bilateral ovarian cysts    Fibroid    History of gestational hypertension    Urachal cyst     Patient Active Problem List   Diagnosis Date Noted   Status post repeat low transverse cesarean section 07/01/2023   Non-English speaking patient [facility] preferred) 01/06/2021   BMI 35.0-35.9,adult 10/23/2019   Obesity in pregnancy 07/14/2019   History of postpartum pre-eclampsia 04/03/2019   Leiomyoma of uterus 03/22/2019   History of 3 cesarean sections 03/16/2019   Language barrier 03/16/2019    Past Surgical History:  Procedure Laterality Date   CESAREAN SECTION N/A 12/05/2013   Procedure: Primary Cesarean Section Delivery Baby Girl @ 0136, Apgars 9/9;  Surgeon: Tereso Newcomer, MD;  Location: [facility];  Service: Obstetrics;  Laterality: N/A;   CESAREAN SECTION N/A 07/20/2016   Procedure: CESAREAN SECTION;  Surgeon: Levie Heritage, DO;  Location: [facility] BIRTHING SUITES;  Service: Obstetrics;  Laterality: N/A;   CESAREAN SECTION N/A 09/15/2019   Procedure: CESAREAN SECTION;  Surgeon: Conan Bowens, MD;  Location: [facility];   Service: Obstetrics;  Laterality: N/A;   CESAREAN SECTION N/A 07/01/2023   Procedure: CESAREAN SECTION;  Surgeon: Kathrynn Running, MD;  Location: [facility];  Service: Obstetrics;  Laterality: N/A;   CYSTOSCOPY W/ RETROGRADES Bilateral 11/06/2020   Procedure: CYSTOSCOPY WITH RETROGRADE PYELOGRAM;  Surgeon: Sebastian Ache, MD;  Location: [facility];  Service: Urology;  Laterality: Bilateral;   CYSTOSCOPY WITH BIOPSY N/A 11/06/2020   Procedure: CYSTOSCOPY WITH BLADDER BIOPSY;  Surgeon: Sebastian Ache, MD;  Location: [facility];  Service: Urology;  Laterality: N/A;  1 HR   DILATION AND EVACUATION N/A 07/20/2015   Procedure: DILATATION AND EVACUATION;  Surgeon: Carrington Clamp, MD;  Location: [facility];  Service: Gynecology;  Laterality: N/A;    OB History     Gravida  5   Para  4   Term  4   Preterm      AB  1   Living  4      SAB  1   IAB      Ectopic      Multiple  0   Live Births  4        Obstetric Comments  11/2013 pLTCS for NRFHT in 1st stage due to lates. 3680gm          Home Medications    Prior to Admission medications   Medication Sig Start Date End Date Taking? Authorizing Provider  acetaminophen (TYLENOL) 500 MG tablet Take 2 tablets (1,000 mg total) by mouth every 6 (  six) hours. Patient not taking: Reported on 07/13/2023 07/03/23   Wyn Forster, MD  coconut oil OIL Apply 1 Application topically as needed. Patient not taking: Reported on 07/13/2023 07/03/23   Wyn Forster, MD  ibuprofen (ADVIL) 600 MG tablet Take 1 tablet (600 mg total) by mouth every 6 (six) hours. Patient not taking: Reported on 07/13/2023 07/03/23   Wyn Forster, MD  oxyCODONE (OXY IR/ROXICODONE) 5 MG immediate release tablet Take 1 tablet (5 mg total) by mouth every 4 (four) hours as needed for moderate pain (pain score 4-6). 07/03/23   Wyn Forster, MD  Prenatal Vit-Fe Fumarate-FA (PRENATAL VITAMIN PLUS LOW IRON) 27-1 MG TABS Take 1  tablet by mouth daily. Patient not taking: Reported on 07/13/2023    [provider]  senna-docusate (SENOKOT-S) 8.6-50 MG tablet Take 2 tablets by mouth daily. Patient not taking: Reported on 07/13/2023 07/03/23   Wyn Forster, MD  witch hazel-glycerin (TUCKS) pad Apply 1 Application topically as needed for hemorrhoids. Patient not taking: Reported on 07/13/2023 07/03/23   Wyn Forster, MD    Family History Family History  Problem Relation Age of Onset   Diabetes Neg Hx    Hypertension Neg Hx     Social History Social History   Tobacco Use   Smoking status: Never   Smokeless tobacco: Never  Vaping Use   Vaping status: Never Used  Substance Use Topics   Alcohol use: No   Drug use: No     Allergies   Patient has no known allergies.   Review of Systems Review of Systems  Constitutional:  Negative for appetite change, diaphoresis and fever.  Respiratory:  Negative for shortness of breath.   Cardiovascular:  Negative for chest pain.  Gastrointestinal:  Negative for nausea and vomiting.  Genitourinary:  Positive for frequency and pelvic pain (pain at surgical site). Negative for dysuria.  Neurological:  Positive for dizziness and headaches. Negative for weakness.     Physical Exam Triage Vital Signs ED Triage Vitals  Encounter Vitals Group     BP 07/16/23 0926 (!) 160/74     Systolic BP Percentile --      Diastolic BP Percentile --      Pulse Rate 07/16/23 0926 (!) 46     Resp 07/16/23 0926 18     Temp 07/16/23 0926 98.9 F (37.2 C)     Temp Source 07/16/23 0926 Oral     SpO2 07/16/23 0926 97 %     Weight --      Height --      Head Circumference --      Peak Flow --      Pain Score 07/16/23 0927 10     Pain Loc --      Pain Education --      Exclude from Growth Chart --    No data found.  Updated Vital Signs BP (!) 160/74 (BP Location: Right Arm)   Pulse (!) 46   Temp 98.9 F (37.2 C) (Oral)   Resp 18   LMP 09/25/2022   SpO2 97%    Breastfeeding Yes   Visual Acuity Right Eye Distance:   Left Eye Distance:   Bilateral Distance:    Right Eye Near:   Left Eye Near:    Bilateral Near:     Physical Exam Vitals and nursing note reviewed.  Constitutional:      Appearance: She is not ill-appearing.  HENT:     Head: Normocephalic.  Eyes:  Extraocular Movements: Extraocular movements intact.     Pupils: Pupils are equal, round, and reactive to light.  Cardiovascular:     Rate and Rhythm: Normal rate.  Pulmonary:     Effort: Pulmonary effort is normal. No respiratory distress.     Breath sounds: Normal breath sounds.  Neurological:     Mental Status: She is alert.     Cranial Nerves: No cranial nerve deficit.     Motor: No weakness.     Gait: Gait normal.  Psychiatric:        Mood and Affect: Mood normal.      UC Treatments / Results  Labs (all labs ordered are listed, but only abnormal results are displayed) Labs Reviewed - No data to display  EKG   Radiology No results found.  Procedures Procedures (including critical care time)  Medications Ordered in UC Medications - No data to display  Initial Impression / Assessment and Plan / UC Course  I have reviewed the triage vital signs and the nursing notes.  Pertinent labs & imaging results that were available during my care of the patient were reviewed by me and considered in my medical decision making (see chart for details).     37 year old female 2 weeks postpartum C-section delivery presents with a global headache, she has a history of postpartum preeclampsia, her blood pressure in clinic is 160/74 which is higher than her prepregnancy blood pressure.  She was counseled to go to the MAU for evaluation. Final Clinical Impressions(s) / UC Diagnoses   Final diagnoses:  Acute intractable headache, unspecified headache type  Postpartum hypertension     Discharge Instructions      Go directly to the MAU further evaluation   ED  Prescriptions   None    PDMP not reviewed this encounter.   Meliton Rattan, [place] 07/16/23 [phone]

## 2023-07-22 ENCOUNTER — Ambulatory Visit (INDEPENDENT_AMBULATORY_CARE_PROVIDER_SITE_OTHER): Payer: Medicaid Other | Admitting: *Deleted

## 2023-07-22 ENCOUNTER — Other Ambulatory Visit: Payer: Self-pay

## 2023-07-22 VITALS — BP 109/74 | HR 89 | Wt 196.9 lb

## 2023-07-22 DIAGNOSIS — R52 Pain, unspecified: Secondary | ICD-10-CM

## 2023-07-22 DIAGNOSIS — K5900 Constipation, unspecified: Secondary | ICD-10-CM

## 2023-07-22 DIAGNOSIS — Z0130 Encounter for examination of blood pressure without abnormal findings: Secondary | ICD-10-CM

## 2023-07-22 MED ORDER — IBUPROFEN 600 MG PO TABS: 600.0000 mg | ORAL_TABLET | Freq: Four times a day (QID) | ORAL | 0 refills | Status: DC

## 2023-07-22 MED ORDER — DOCUSATE SODIUM 100 MG PO CAPS: 100.0000 mg | ORAL_CAPSULE | Freq: Two times a day (BID) | ORAL | 0 refills | Status: DC

## 2023-07-22 NOTE — Progress Notes (Signed)
Here for BP s/p MAU visit for headaches and diagnosed with postpartum hypertension and prescribed Procardia.  Request refill of Ibuprofen for pain and colace for constipation BP wnl today at 109/74. States she has had a headache x2days and has not taken anything for it because she ran out of ibuprofen.  States thinks is because not getting sleep due to baby. No edema noted. I advised she be sure she is drinking enough water and try to get short rest periods I reviewed assessment with Dr. Shawnie Pons. No new orders. I reviewed signs of pre-eclampsia and when to go to hospital. Rx sent as requested.  I advised to call for  appt at [facility] for postpartum visit.  She voices understanding. Nancy Fetter

## 2023-08-13 ENCOUNTER — Other Ambulatory Visit: Payer: Self-pay | Admitting: Obstetrics and Gynecology

## 2023-08-27 DIAGNOSIS — Z30012 Encounter for prescription of emergency contraception: Secondary | ICD-10-CM | POA: Diagnosis not present

## 2023-08-27 DIAGNOSIS — Z3009 Encounter for other general counseling and advice on contraception: Secondary | ICD-10-CM | POA: Diagnosis not present

## 2023-08-31 ENCOUNTER — Ambulatory Visit: Payer: Medicaid Other | Admitting: Medical

## 2023-08-31 VITALS — BP 112/62 | HR 58 | Temp 97.1°F | Wt 194.0 lb

## 2023-08-31 DIAGNOSIS — R21 Rash and other nonspecific skin eruption: Secondary | ICD-10-CM | POA: Diagnosis not present

## 2023-08-31 DIAGNOSIS — K5900 Constipation, unspecified: Secondary | ICD-10-CM | POA: Diagnosis not present

## 2023-08-31 MED ORDER — PREDNISONE 20 MG PO TABS: ORAL_TABLET | ORAL | 0 refills | Status: AC

## 2023-08-31 MED ORDER — LINACLOTIDE 145 MCG PO CAPS: 145.0000 ug | ORAL_CAPSULE | Freq: Every day | ORAL | 5 refills | Status: AC

## 2023-08-31 MED ORDER — TRIAMCINOLONE ACETONIDE 0.1 % EX CREA: 1.0000 | TOPICAL_CREAM | Freq: Two times a day (BID) | CUTANEOUS | 0 refills | Status: AC

## 2023-08-31 NOTE — Patient Instructions (Addendum)
Recommendations Rash Begin prednisone x 3 days, 60 mg the first day, 40 mg a second day and 20 mg the third day.  Try not to use breast milk the first 2-3 hours after taking the prednisone.   You can use triamcinolone prescription cream once or twice a day for the next several days until the rash has resolved Do not use the cream on the face You can still use Benadryl 2 or 3 times a day for the next few days for itching If not much improved over the next 3 to 4 days then let us know  Constipation Try to drink at least 80 to 100 ounces of water a day Get fiber in the diet daily Eat things like whole grains, leafy lettuce is, greens, apples and pears, figs, and other sources of fiber Begin back on Linzess 145 mg daily to help with constipation

## 2023-08-31 NOTE — Progress Notes (Signed)
Subjective:  Anne Murillo is a 37 y.o. female who presents for Chief Complaint  Patient presents with   Rash    Rash all over body 2 weeks. Itchy, red.      Here for concern of rash.  She has had a rash for about 2 weeks.  The rash is on her back, torso, breast, arms.  Not on the legs.  No sick contact with similar.  No other people rash in the household.  No recent new exposures.  She is using Benadryl by mouth over-the-counter  She has a history of chronic constipation.  MiraLAX does not seem to work like it did in the past.  She would like to go back on Linzess which worked in the past.  She does drink a good amount of water and gets fiber in the diet.  She is breast-feeding.  Baby is 2 months old.  No other aggravating or relieving factors.    No other c/o.  Past Medical History:  Diagnosis Date   Bilateral ovarian cysts    Fibroid    History of gestational hypertension    Urachal cyst    Current Outpatient Medications on File Prior to Visit  Medication Sig Dispense Refill   NIFEdipine (PROCARDIA-XL/NIFEDICAL-XL) 30 MG 24 hr tablet TAKE 1 TABLET(30 MG) BY MOUTH DAILY 30 tablet 0   witch hazel-glycerin (TUCKS) pad Apply 1 Application topically as needed for hemorrhoids. 40 each 12   acetaminophen (TYLENOL) 500 MG tablet Take 2 tablets (1,000 mg total) by mouth every 6 (six) hours. 30 tablet 0   No current facility-administered medications on file prior to visit.     The following portions of the patient's history were reviewed and updated as appropriate: allergies, current medications, past family history, past medical history, past social history, past surgical history and problem list.  ROS Otherwise as in subjective above  Objective: BP 112/62   Pulse (!) 58   Temp (!) 97.1 F (36.2 C)   Wt 194 lb (88 kg)   Breastfeeding Yes   BMI 34.37 kg/m   General appearance: alert, no distress, well developed, well nourished Abdomen: +bs, soft, non tender, non  distended, no masses, no hepatomegaly, no splenomegaly Pulses: 2+ radial pulses, 2+ pedal pulses, normal cap refill Ext: no edema Skin she has some faint irritated appearing skin of the breast, on antecubital areas, some on the low back but no distinct markings.  Rash is somewhat nonspecific.  Exam chaperoned by nurse   Assessment: Encounter Diagnoses  Name Primary?   Rash Yes   Constipation, unspecified constipation type    Breast feeding status of mother      Plan: Recommendations Rash Begin prednisone x 3 days, 60 mg the first day, 40 mg a second day and 20 mg the third day.  Try not to use breast milk the first 2-3 hours after taking the prednisone.   You can use triamcinolone prescription cream once or twice a day for the next several days until the rash has resolved Do not use the cream on the face You can still use Benadryl 2 or 3 times a day for the next few days for itching If not much improved over the next 3 to 4 days then let us know  Constipation Try to drink at least 80 to 100 ounces of water a day Get fiber in the diet daily Eat things like whole grains, leafy lettuce is, greens, apples and pears, figs, and other sources of fiber Begin  back on Linzess 145 mg daily to help with constipation    Anne Murillo was seen today for rash.  Diagnoses and all orders for this visit:  Rash  Constipation, unspecified constipation type  Breast feeding status of mother  Other orders -     linaclotide (LINZESS) 145 MCG CAPS capsule; Take 1 capsule (145 mcg total) by mouth daily. -     predniSONE (DELTASONE) 20 MG tablet; 3 tablets today, 2 tablets tomorrow, 1 tablet the third day -     triamcinolone cream (KENALOG) 0.1 %; Apply 1 Application topically 2 (two) times daily.    Follow up: prn

## 2023-09-13 ENCOUNTER — Other Ambulatory Visit: Payer: Self-pay | Admitting: Obstetrics and Gynecology

## 2024-03-27 DIAGNOSIS — H5213 Myopia, bilateral: Secondary | ICD-10-CM | POA: Diagnosis not present
